# Patient Record
Sex: Female | Born: 1945 | Race: White | Hispanic: No | Smoking: Never smoker
Health system: Southern US, Community
[De-identification: ages and names within clinical notes are randomized; demographics above are authoritative.]

## PROBLEM LIST (undated history)

## (undated) DIAGNOSIS — F32A Depression, unspecified: Secondary | ICD-10-CM

## (undated) DIAGNOSIS — R569 Unspecified convulsions: Secondary | ICD-10-CM

## (undated) DIAGNOSIS — R413 Other amnesia: Secondary | ICD-10-CM

## (undated) HISTORY — DX: Other amnesia: R41.3

## (undated) HISTORY — PX: TUBAL LIGATION: SHX77

## (undated) HISTORY — DX: Unspecified convulsions: R56.9

## (undated) HISTORY — PX: APPENDECTOMY: SHX54

---

## 2000-10-16 ENCOUNTER — Other Ambulatory Visit: Admission: RE | Admit: 2000-10-16 | Discharge: 2000-10-16 | Payer: Self-pay | Admitting: Otolaryngology

## 2002-04-19 ENCOUNTER — Ambulatory Visit (HOSPITAL_COMMUNITY): Admission: RE | Admit: 2002-04-19 | Discharge: 2002-04-19 | Payer: Self-pay | Admitting: Neurology

## 2002-04-19 ENCOUNTER — Encounter: Payer: Self-pay | Admitting: Neurology

## 2004-06-23 ENCOUNTER — Ambulatory Visit: Payer: Self-pay | Admitting: Internal Medicine

## 2004-07-06 ENCOUNTER — Ambulatory Visit: Payer: Self-pay | Admitting: Internal Medicine

## 2004-07-06 ENCOUNTER — Ambulatory Visit (HOSPITAL_COMMUNITY): Admission: RE | Admit: 2004-07-06 | Discharge: 2004-07-06 | Payer: Self-pay | Admitting: Internal Medicine

## 2006-07-14 ENCOUNTER — Ambulatory Visit: Payer: Self-pay | Admitting: Cardiology

## 2012-06-12 DIAGNOSIS — M169 Osteoarthritis of hip, unspecified: Secondary | ICD-10-CM | POA: Insufficient documentation

## 2012-07-06 DIAGNOSIS — D649 Anemia, unspecified: Secondary | ICD-10-CM | POA: Insufficient documentation

## 2012-08-13 DIAGNOSIS — M1612 Unilateral primary osteoarthritis, left hip: Secondary | ICD-10-CM | POA: Insufficient documentation

## 2012-08-14 DIAGNOSIS — F32A Depression, unspecified: Secondary | ICD-10-CM | POA: Insufficient documentation

## 2012-08-16 ENCOUNTER — Inpatient Hospital Stay
Admission: RE | Admit: 2012-08-16 | Discharge: 2012-08-16 | Disposition: A | Discharge status: Home / Self Care | Payer: Medicare Other | Source: Ambulatory Visit | Attending: Internal Medicine | Admitting: Internal Medicine

## 2013-04-29 ENCOUNTER — Telehealth: Payer: Self-pay | Admitting: Neurology

## 2013-04-29 MED ORDER — LEVETIRACETAM 500 MG PO TABS: 1000.0000 mg | ORAL_TABLET | Freq: Two times a day (BID) | ORAL | Status: DC

## 2013-04-29 MED ORDER — LAMOTRIGINE 200 MG PO TABS: 200.0000 mg | ORAL_TABLET | Freq: Two times a day (BID) | ORAL | Status: DC

## 2013-04-29 NOTE — Telephone Encounter (Signed)
Per [facility], we have previously been prescribing these meds.  Patient has an appt in March.  Rx's have been sent to last until she is seen.

## 2013-04-29 NOTE — Telephone Encounter (Signed)
Patient states she needs new script written by Dr. Terrace ArabiaYan for Keppra and Lamictal, since she is about to run out, she has just enough for tomorrow morning. The script was written by a different doctor, Dr. Arty Baumgartnerhumley, and now it needs to be from Dr. Terrace ArabiaYan.

## 2013-05-16 ENCOUNTER — Telehealth: Payer: Self-pay | Admitting: Neurology

## 2013-05-16 NOTE — Telephone Encounter (Signed)
I received a call from Crouch[facility] at [facility] that the patient was there to have labs drawn for a Lamictal level, but the order was from a year ago, and the patient had already taken her medicine.  The patient is scheduled to come in on 05-22-13 to see the doctor, I told her that she can have her labs done here at that appointment.

## 2013-05-22 ENCOUNTER — Ambulatory Visit (INDEPENDENT_AMBULATORY_CARE_PROVIDER_SITE_OTHER): Payer: Medicare Other | Admitting: Neurology

## 2013-05-22 ENCOUNTER — Encounter (INDEPENDENT_AMBULATORY_CARE_PROVIDER_SITE_OTHER): Payer: Self-pay

## 2013-05-22 ENCOUNTER — Encounter: Payer: Self-pay | Admitting: Neurology

## 2013-05-22 VITALS — BP 128/68 | HR 80 | Ht 60.0 in | Wt 195.0 lb

## 2013-05-22 DIAGNOSIS — R269 Unspecified abnormalities of gait and mobility: Secondary | ICD-10-CM

## 2013-05-22 DIAGNOSIS — R569 Unspecified convulsions: Secondary | ICD-10-CM | POA: Insufficient documentation

## 2013-05-22 MED ORDER — LEVETIRACETAM 500 MG PO TABS: 1000.0000 mg | ORAL_TABLET | Freq: Two times a day (BID) | ORAL | Status: DC

## 2013-05-22 MED ORDER — LAMOTRIGINE 200 MG PO TABS
200.0000 mg | ORAL_TABLET | Freq: Two times a day (BID) | ORAL | Status: DC
Start: 2013-05-22 — End: 2013-06-10

## 2013-05-22 NOTE — Progress Notes (Signed)
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  She had a history of complex partial seizure with secondary generalization since age 80,  presented with rising sensation, then passed out followed by generalized motor seizure, last seizure was in 2002, she was treated with Lamictal 200 mg twice a day, continue to have recurrent seizure, Keppra 500 mg 2 tablets twice a day was added on, no recurrent seizure ever since. She is tolerating the medications, denies significant side effect.  Previously she was evaluated by outside neurologist, reported normal MRI of the brain, and the EEG,  She also had a history of left hip replacement, mild gait difficulty, more stiffness, denies bowel and bladder incontinence,  REVIEW OF SYSTEMS: Full 14 system review of systems performed and notable only for seizure, memory loss, frequent awakening, daytime sleepiness, looking difficulty, depression  ALLERGIES: No Known Allergies  HOME MEDICATIONS: No current outpatient prescriptions on file prior to visit.   No current facility-administered medications on file prior to visit.    PAST MEDICAL HISTORY: Past Medical History  Diagnosis Date  . Seizure     PAST SURGICAL HISTORY: Past Surgical History  Procedure Laterality Date  . Appendectomy    . Tubal ligation      FAMILY HISTORY: Family History  Problem Relation Age of Onset  . Pneumonia Mother   . Heart attack Maternal Grandmother   . Stroke Maternal Grandmother   . Suicidality Brother   . Aneurysm Maternal Aunt     SOCIAL HISTORY:  History   Social History  . Marital Status: Widowed    Spouse Name: N/A    Number of Children: 0  . Years of Education: college   Occupational History  .      retired   Social History Main Topics  .  Smoking status: Never Smoker   . Smokeless tobacco: Never Used  . Alcohol Use: 0.6 oz/week    1 Glasses of wine per week  . Drug Use: No  . Sexual Activity: Not on file   Other Topics Concern  . Not on file   Social History Narrative   Patient lives at home alone and she i]s widowed. Retired.   College education    Caffeine one cup daily.           PHYSICAL EXAM   Filed Vitals:   05/22/13 1130  BP: 128/68  Pulse: 80  Height: 5' (1.524 m)  Weight: 195 lb (88.451 kg)    Not recorded    Body mass index is 38.08 kg/(m^2).   Generalized: In no acute distress  Neck: Supple, no carotid bruits   Cardiac: Regular rate rhythm  Pulmonary: Clear to auscultation bilaterally  Musculoskeletal: No deformity  Neurological examination  Mentation: Alert oriented to time, place, history taking, and causual conversation  Cranial nerve II-XII: Pupils were equal round reactive to light. Extraocular movements were full.  Visual field were full on confrontational test. Bilateral fundi were sharp.  Facial sensation and strength were normal. Hearing was intact to finger rubbing bilaterally. Uvula tongue midline.  Head turning and shoulder shrug and were normal and symmetric.Tongue protrusion into cheek strength was normal.  Motor: Mild bilateral hip flexion weakness  Sensory: Intact to fine touch, pinprick, preserved vibratory sensation, and proprioception at toes.  Coordination: Normal finger  to nose, heel-to-shin bilaterally there was no truncal ataxia  Gait: Rising up from seated position without assistance, normal stance, stiff,scissor gait  Romberg signs: Negative  Deep tendon reflexes: Brachioradialis 3/3, biceps  3/3, triceps  3/3, patellar  3/3, Achilles 2/2, plantar responses were extensor bilaterally.   DIAGNOSTIC DATA (LABS, IMAGING, TESTING) - I reviewed patient records, labs, notes, testing and imaging myself where available.    ASSESSMENT AND PLAN  Sheri Valencia  is a 68 y.o. 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      Levert Feinstein, M.D. Ph.D.  [facility] [address] [place], [place] [address] [phone]

## 2013-06-06 ENCOUNTER — Ambulatory Visit (HOSPITAL_COMMUNITY)
Admission: RE | Admit: 2013-06-06 | Discharge: 2013-06-06 | Disposition: A | Discharge status: Home / Self Care | Payer: Medicare Other | Source: Ambulatory Visit | Attending: Neurology | Admitting: Neurology

## 2013-06-06 DIAGNOSIS — M47812 Spondylosis without myelopathy or radiculopathy, cervical region: Secondary | ICD-10-CM | POA: Insufficient documentation

## 2013-06-06 DIAGNOSIS — Q762 Congenital spondylolisthesis: Secondary | ICD-10-CM | POA: Insufficient documentation

## 2013-06-06 DIAGNOSIS — R569 Unspecified convulsions: Secondary | ICD-10-CM

## 2013-06-06 DIAGNOSIS — M5030 Other cervical disc degeneration, unspecified cervical region: Secondary | ICD-10-CM | POA: Insufficient documentation

## 2013-06-06 DIAGNOSIS — M5020 Other cervical disc displacement, unspecified cervical region: Secondary | ICD-10-CM | POA: Insufficient documentation

## 2013-06-06 DIAGNOSIS — R269 Unspecified abnormalities of gait and mobility: Secondary | ICD-10-CM

## 2013-06-06 DIAGNOSIS — M79609 Pain in unspecified limb: Secondary | ICD-10-CM | POA: Insufficient documentation

## 2013-06-06 DIAGNOSIS — M4802 Spinal stenosis, cervical region: Secondary | ICD-10-CM | POA: Insufficient documentation

## 2013-06-10 ENCOUNTER — Telehealth: Payer: Self-pay

## 2013-06-10 ENCOUNTER — Other Ambulatory Visit: Payer: Self-pay | Admitting: Neurology

## 2013-06-10 MED ORDER — LAMOTRIGINE 200 MG PO TABS: 200.0000 mg | ORAL_TABLET | Freq: Two times a day (BID) | ORAL | Status: DC

## 2013-06-10 NOTE — Telephone Encounter (Signed)
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 Options are conservative mgmt vs neurosurg eval. Will hold off on decision until Dr. Terrace ArabiaYan can review. -VRP ------

## 2013-06-10 NOTE — Telephone Encounter (Signed)
Spoke to patient. Went over MRI results. Patient agreed. Requesting lamoTRIgine (LAMICTAL) 200 MG tablet refill. [facility]

## 2013-06-10 NOTE — Telephone Encounter (Signed)
Refill sent.

## 2013-06-10 NOTE — Telephone Encounter (Signed)
Patient returning your call.

## 2013-06-20 NOTE — Progress Notes (Signed)
Quick Note:  Patient notified of MRI results. See phone note. ______

## 2013-06-25 ENCOUNTER — Ambulatory Visit (INDEPENDENT_AMBULATORY_CARE_PROVIDER_SITE_OTHER): Payer: Medicare Other | Admitting: Neurology

## 2013-06-25 ENCOUNTER — Encounter (INDEPENDENT_AMBULATORY_CARE_PROVIDER_SITE_OTHER): Payer: Self-pay

## 2013-06-25 ENCOUNTER — Encounter: Payer: Self-pay | Admitting: Neurology

## 2013-06-25 VITALS — BP 147/68 | HR 94 | Ht 65.0 in | Wt 101.0 lb

## 2013-06-25 DIAGNOSIS — M47812 Spondylosis without myelopathy or radiculopathy, cervical region: Secondary | ICD-10-CM | POA: Insufficient documentation

## 2013-06-25 NOTE — Progress Notes (Signed)
PATIENT: Melisse Flecker Janis DOB: September 16, 1945  HISTORICAL  Elpidia Graveline Mehrer is a 68 years old right-handed Caucasian female, has been followed by our clinic for complex partial seizure with secondary generalizing, last clinical visit was March 2014  She had a history of complex partial seizure with secondary generalization since age 37,  presented with rising sensation, then passed out followed by generalized motor seizure, last seizure was in 2002, she was treated with Lamictal 200 mg twice a day, continue to have recurrent seizure, Keppra 500 mg 2 tablets twice a day was added on, no recurrent seizure ever since. She is tolerating the medications, denies significant side effect.  Previously she was evaluated by outside neurologist, reported normal MRI of the brain, and the EEG,  She also had a history of left hip replacement in June 2014, mild gait difficulty, more stiffness, denies bowel and bladder incontinence,.  UPDATE April 21st 2015: She denies significant neck pain, she has right low back pain, going down her right buttock cheek, right lateral thigh,  She has mild unsteady gait, always contributed to her left hip problem, she denies bowel and bladder incontinence. She is tolerating her antiepileptic medication well, there was no recurrent seizure.   REVIEW OF SYSTEMS: Full 14 system review of systems performed and notable only for memory loss, seizure, right side low back pain, right hip pain   ALLERGIES: No Known Allergies  HOME MEDICATIONS: Current Outpatient Prescriptions on File Prior to Visit  Medication Sig Dispense Refill  . amLODipine (NORVASC) 5 MG tablet Take 5 mg by mouth daily.      Marland Kitchen aspirin 81 MG tablet Take 81 mg by mouth daily.      . Calcium Carbonate-Vit D-Min (CALTRATE PLUS PO) Take by mouth daily.      Marland Kitchen lamoTRIgine (LAMICTAL) 200 MG tablet Take 1 tablet (200 mg total) by mouth 2 (two) times daily.  60 tablet  12  . levETIRAcetam (KEPPRA) 500 MG tablet Take 2 tablets  (1,000 mg total) by mouth 2 (two) times daily.  120 tablet  12  . lisinopril (PRINIVIL,ZESTRIL) 20 MG tablet Take 20 mg by mouth daily.      . Multiple Vitamin (VITAMIN E/FOLIC ACID/B-6/B-12) CAPS Take by mouth daily.      . sertraline (ZOLOFT) 100 MG tablet Take 100 mg by mouth daily.      Marland Kitchen VITAMIN D, ERGOCALCIFEROL, PO Take by mouth daily.       No current facility-administered medications on file prior to visit.    PAST MEDICAL HISTORY: Past Medical History  Diagnosis Date  . Seizure     PAST SURGICAL HISTORY: Past Surgical History  Procedure Laterality Date  . Appendectomy    . Tubal ligation      FAMILY HISTORY: Family History  Problem Relation Age of Onset  . Pneumonia Mother   . Heart attack Maternal Grandmother   . Stroke Maternal Grandmother   . Suicidality Brother   . Aneurysm Maternal Aunt     SOCIAL HISTORY:  History   Social History  . Marital Status: Widowed    Spouse Name: N/A    Number of Children: 0  . Years of Education: college   Occupational History  .      retired   Social History Main Topics  . Smoking status: Never Smoker   . Smokeless tobacco: Never Used  . Alcohol Use: 0.6 oz/week    1 Glasses of wine per week  . Drug Use: No  .  Sexual Activity: Not on file   Other Topics Concern  . Not on file   Social History Narrative   Patient lives at home alone and she is widowed. Retired.   College education    Caffeine one cup daily.              PHYSICAL EXAM   Filed Vitals:   06/25/13 1042  BP: 147/68  Pulse: 94  Height: 5\' 5"  (1.651 m)  Weight: 101 lb (45.813 kg)    Not recorded    Body mass index is 16.81 kg/(m^2).   Generalized: In no acute distress  Neck: Supple, no carotid bruits   Cardiac: Regular rate rhythm  Pulmonary: Clear to auscultation bilaterally  Musculoskeletal: No deformity  Neurological examination  Mentation: Alert oriented to time, place, history taking, and causual  conversation  Cranial nerve II-XII: Pupils were equal round reactive to light. Extraocular movements were full.  Visual field were full on confrontational test. Bilateral fundi were sharp.  Facial sensation and strength were normal. Hearing was intact to finger rubbing bilaterally. Uvula tongue midline.  Head turning and shoulder shrug and were normal and symmetric.Tongue protrusion into cheek strength was normal.  Motor: Mild bilateral hip flexion weakness, Mild left shoulder abduction, external rotation, left elbow flexion weakness  Sensory: Intact to fine touch, pinprick, preserved vibratory sensation, and proprioception at toes.  Coordination: Normal finger to nose, heel-to-shin bilaterally there was no truncal ataxia  Gait: Rising up from seated position without assistance, normal stance, stiff,scissor gait  Romberg signs: Negative  Deep tendon reflexes: Brachioradialis 3/3, biceps  3/3, triceps  3/3, patellar  3/3, Achilles 2/2, plantar responses were extensor bilaterally.   DIAGNOSTIC DATA (LABS, IMAGING, TESTING) - I reviewed patient records, labs, notes, testing and imaging myself where available.    ASSESSMENT AND PLAN  Flannery Smithart Frost is a 68 y.o. 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   1. Continue Lamictal, Keppra, 2, she has signs of cervical myelopathy, which is confirmed by abnormal cervical MRI, detailed above, will refer her to neurosurgeon  Levert Feinstein, M.D. Ph.D.  [facility] [address] [place], [place]  [address] [phone]

## 2013-08-15 ENCOUNTER — Telehealth: Payer: Self-pay | Admitting: Neurology

## 2013-08-15 NOTE — Telephone Encounter (Signed)
Patient calling asking for referral to neurosurgeon be to Dr Trey Sailors in [place]. Please call to let her know this is ok

## 2013-08-19 NOTE — Telephone Encounter (Signed)
Pt calling requesting that referral be sent over to Dr. Channing Muttersoy office. Darreld Mcleanana Cox, MA please advise

## 2013-08-20 NOTE — Telephone Encounter (Signed)
Faxed. To Dr.Roy's office.

## 2014-01-29 ENCOUNTER — Encounter: Payer: Self-pay | Admitting: Neurology

## 2014-05-20 ENCOUNTER — Other Ambulatory Visit: Payer: Self-pay | Admitting: Neurology

## 2014-05-20 NOTE — Telephone Encounter (Signed)
Patient has appt scheduled

## 2014-05-21 ENCOUNTER — Other Ambulatory Visit: Payer: Self-pay | Admitting: Neurology

## 2014-05-21 ENCOUNTER — Telehealth: Payer: Self-pay | Admitting: Neurology

## 2014-05-21 NOTE — Telephone Encounter (Signed)
Both Rx's were already sent yesterday.  I called and spoke with the pharmacist who verified they do have the Rx's.  I called the patient back.  Got no answer.  Left message.

## 2014-05-21 NOTE — Telephone Encounter (Signed)
Pt is calling requesting a refill on lamoTRIgine (LAMICTAL) 200 MG tablet and levETIRAcetam (KEPPRA) 500 MG tablet. She uses [facility] in [place].  Please call and advise.

## 2014-05-21 NOTE — Telephone Encounter (Signed)
Duplicate request.  Rx was already sent.

## 2014-06-26 ENCOUNTER — Ambulatory Visit: Payer: Medicare Other | Admitting: Nurse Practitioner

## 2014-06-26 ENCOUNTER — Ambulatory Visit (INDEPENDENT_AMBULATORY_CARE_PROVIDER_SITE_OTHER): Payer: Medicare Other | Admitting: Nurse Practitioner

## 2014-06-26 ENCOUNTER — Encounter: Payer: Self-pay | Admitting: Nurse Practitioner

## 2014-06-26 VITALS — BP 152/69 | HR 83 | Ht 61.0 in | Wt 106.0 lb

## 2014-06-26 DIAGNOSIS — R569 Unspecified convulsions: Secondary | ICD-10-CM

## 2014-06-26 DIAGNOSIS — M47812 Spondylosis without myelopathy or radiculopathy, cervical region: Secondary | ICD-10-CM

## 2014-06-26 DIAGNOSIS — R269 Unspecified abnormalities of gait and mobility: Secondary | ICD-10-CM | POA: Diagnosis not present

## 2014-06-26 MED ORDER — LAMOTRIGINE 200 MG PO TABS: 200.0000 mg | ORAL_TABLET | Freq: Two times a day (BID) | ORAL | Status: DC

## 2014-06-26 MED ORDER — LEVETIRACETAM 500 MG PO TABS: ORAL_TABLET | ORAL | Status: DC

## 2014-06-26 NOTE — Patient Instructions (Signed)
Continue Lamictal and Keppra at current doses will refill Call for any seizure activity Follow-up yearly and when necessary

## 2014-06-26 NOTE — Progress Notes (Signed)
[facility]  PATIENT: Sheri Valencia DOB: Oct 24, 1945   REASON FOR VISIT: Follow-up for seizure disorder  HISTORY FROM: Patient and sister    HISTORY OF PRESENT ILLNESS:Sheri Valencia is a 69 years old right-handed Caucasian female,followed  for complex partial seizure with secondary generalizing, last clinical visit was 06/25/2013 She had a history of complex partial seizure with secondary generalization since age 47, presented with rising sensation, then passed out followed by generalized motor seizure, last seizure was in 2002, she was treated with Lamictal 200 mg twice a day, continue to have recurrent seizure, Keppra 500 mg 2 tablets twice a day was added on, no recurrent seizure ever since. 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 She denies significant neck pain, she has right low back pain, going down her right buttock cheek, right lateral thigh, She has mild unsteady gait, always contributed to her left hip problem, she denies bowel and bladder incontinence. She is tolerating her antiepileptic medication well, there was no recurrent seizure. She was evaluated last year by Dr. Channing Mutters, neurosurgeon she did not have surgery . She returns for reevaluation and refills   REVIEW OF SYSTEMS: Full 14 system review of systems performed and notable only for those listed, all others are neg:  Constitutional: neg  Cardiovascular: neg Ear/Nose/Throat: neg  Skin: neg Eyes: neg Respiratory: neg Gastroitestinal: neg  Hematology/Lymphatic: neg  Endocrine: neg Musculoskeletal:neg Allergy/Immunology: neg Neurological: neg Psychiatric: Decreased concentration Sleep : neg   ALLERGIES: Allergies  Allergen Reactions  .  Ativan [Lorazepam]     confusion    HOME MEDICATIONS: Outpatient Prescriptions Prior to Visit  Medication Sig Dispense Refill  . aspirin 81 MG tablet Take 81 mg by mouth daily.    . Calcium Carbonate-Vit D-Min (CALTRATE PLUS PO) Take by mouth daily.    Marland Kitchen lamoTRIgine (LAMICTAL) 200 MG tablet Take 1 tablet (200 mg total) by mouth 2 (two) times daily. 60 tablet 12  . levETIRAcetam (KEPPRA) 500 MG tablet TAKE 2 TABLETS BY MOUTH TWICE DAILY. 120 tablet 1  . Multiple Vitamin (VITAMIN E/FOLIC ACID/B-6/B-12) CAPS Take by mouth daily.    . sertraline (ZOLOFT) 100 MG tablet Take 100 mg by mouth daily.    Marland Kitchen VITAMIN D, ERGOCALCIFEROL, PO Take by mouth daily.    Marland Kitchen amLODipine (NORVASC) 5 MG tablet Take 5 mg by mouth daily.    Marland Kitchen lisinopril (PRINIVIL,ZESTRIL) 20 MG tablet Take 20 mg by mouth daily.    Marland Kitchen lamoTRIgine (LAMICTAL) 200 MG tablet TAKE (1) TABLET TWICE DAILY. 60 tablet 1   No facility-administered medications prior to visit.    PAST MEDICAL HISTORY: Past Medical History  Diagnosis Date  . Seizure     PAST SURGICAL HISTORY: Past Surgical History  Procedure Laterality Date  . Appendectomy    . Tubal ligation      FAMILY HISTORY: Family History  Problem Relation Age of Onset  . Pneumonia Mother   . Heart attack Maternal Grandmother   . Stroke Maternal Grandmother   . Suicidality Brother   . Aneurysm Maternal Aunt     SOCIAL HISTORY: History   Social History  . Marital Status: Widowed    Spouse Name: N/A  . Number of Children: 0  . Years of Education: college  Occupational History  .      retired   Social History Main Topics  . Smoking status: Never Smoker   . Smokeless tobacco: Never Used  . Alcohol Use: 0.6 oz/week    1 Glasses of wine per week  . Drug Use: No  . Sexual Activity: Not on file   Other Topics Concern  . Not on file   Social History Narrative   Patient lives at home alone and she is widowed. Retired.   College education    Caffeine one cup  daily.              PHYSICAL EXAM  Filed Vitals:   06/26/14 1355  BP: 152/69  Pulse: 83  Height: 5\' 1"  (1.549 m)  Weight: 106 lb (48.081 kg)   Body mass index is 20.04 kg/(m^2). Generalized: In no acute distress Neck: Supple, no carotid bruits  Cardiac: Regular rate rhythm Pulmonary: Clear to auscultation bilaterally Musculoskeletal: No deformity  Neurological examination Mentation: Alert oriented to time, place, history taking, and causual conversation Cranial nerve II-XII: Pupils were equal round reactive to light. Extraocular movements were full. Visual field were full on confrontational test. Bilateral fundi were sharp. Facial sensation and strength were normal. Hearing was intact to finger rubbing bilaterally. Uvula tongue midline. Head turning and shoulder shrug and were normal and symmetric.Tongue protrusion into cheek strength was normal. Motor: Mild bilateral hip flexion weakness, otherwise good strength in the upper and lower extremities. Sensory: Intact to fine touch, pinprick, preserved vibratory sensation, and proprioception at toes. Coordination: Normal finger to nose, heel-to-shin bilaterally Gait: Rising up from seated position without assistance, normal stance, can heel toe and tandem walk without difficulty Romberg signs: Negative  Deep tendon reflexes: Brachioradialis 2/2, biceps 2/2, triceps 2/2, patellar 2/2, Achilles 2/2, plantar responses were extensor bilaterally.   DIAGNOSTIC DATA (LABS, IMAGING, TESTING)   ASSESSMENT AND PLAN  69 y.o. year old female  has a past medical history of complex partial Seizure with secondary generalization. Last seizure activity in 2002.  Continue Lamictal and Keppra at current doses will refill Call for any seizure activity Follow-up yearly and when necessary Nilda Riggs, [facility], [facility], APRN  [facility] [address] [place], [place] [address] [phone]

## 2014-06-27 NOTE — Progress Notes (Signed)
I have reviewed and agreed above plan. 

## 2014-07-03 ENCOUNTER — Telehealth: Payer: Self-pay | Admitting: Internal Medicine

## 2014-07-03 NOTE — Telephone Encounter (Signed)
TRIAGE FOR [employer]. 

## 2014-07-04 NOTE — Telephone Encounter (Signed)
Correction: Did not mail the first letter, she was not referred from PCP, on recall.  Mailed the recall letter.

## 2014-07-04 NOTE — Telephone Encounter (Signed)
Letter mailed to pt.  

## 2015-03-10 DIAGNOSIS — I10 Essential (primary) hypertension: Secondary | ICD-10-CM | POA: Diagnosis not present

## 2015-03-10 DIAGNOSIS — G309 Alzheimer's disease, unspecified: Secondary | ICD-10-CM | POA: Diagnosis not present

## 2015-03-10 DIAGNOSIS — F0280 Dementia in other diseases classified elsewhere without behavioral disturbance: Secondary | ICD-10-CM | POA: Diagnosis not present

## 2015-03-10 DIAGNOSIS — E7800 Pure hypercholesterolemia, unspecified: Secondary | ICD-10-CM | POA: Diagnosis not present

## 2015-03-11 DIAGNOSIS — M4712 Other spondylosis with myelopathy, cervical region: Secondary | ICD-10-CM | POA: Diagnosis not present

## 2015-03-11 DIAGNOSIS — M4312 Spondylolisthesis, cervical region: Secondary | ICD-10-CM | POA: Diagnosis not present

## 2015-06-09 DIAGNOSIS — E7800 Pure hypercholesterolemia, unspecified: Secondary | ICD-10-CM | POA: Diagnosis not present

## 2015-06-10 DIAGNOSIS — F329 Major depressive disorder, single episode, unspecified: Secondary | ICD-10-CM | POA: Diagnosis not present

## 2015-06-10 DIAGNOSIS — E7800 Pure hypercholesterolemia, unspecified: Secondary | ICD-10-CM | POA: Diagnosis not present

## 2015-06-10 DIAGNOSIS — G309 Alzheimer's disease, unspecified: Secondary | ICD-10-CM | POA: Diagnosis not present

## 2015-06-10 DIAGNOSIS — F0280 Dementia in other diseases classified elsewhere without behavioral disturbance: Secondary | ICD-10-CM | POA: Diagnosis not present

## 2015-06-10 DIAGNOSIS — Z6821 Body mass index (BMI) 21.0-21.9, adult: Secondary | ICD-10-CM | POA: Diagnosis not present

## 2015-06-10 DIAGNOSIS — Z789 Other specified health status: Secondary | ICD-10-CM | POA: Diagnosis not present

## 2015-06-22 ENCOUNTER — Other Ambulatory Visit: Payer: Self-pay | Admitting: Nurse Practitioner

## 2015-06-26 ENCOUNTER — Ambulatory Visit (INDEPENDENT_AMBULATORY_CARE_PROVIDER_SITE_OTHER): Payer: PPO | Admitting: Nurse Practitioner

## 2015-06-26 ENCOUNTER — Encounter (INDEPENDENT_AMBULATORY_CARE_PROVIDER_SITE_OTHER): Payer: Self-pay

## 2015-06-26 ENCOUNTER — Encounter: Payer: Self-pay | Admitting: Nurse Practitioner

## 2015-06-26 VITALS — BP 135/64 | HR 77 | Ht 61.0 in | Wt 103.8 lb

## 2015-06-26 DIAGNOSIS — R569 Unspecified convulsions: Secondary | ICD-10-CM | POA: Diagnosis not present

## 2015-06-26 DIAGNOSIS — M47812 Spondylosis without myelopathy or radiculopathy, cervical region: Secondary | ICD-10-CM | POA: Diagnosis not present

## 2015-06-26 DIAGNOSIS — R269 Unspecified abnormalities of gait and mobility: Secondary | ICD-10-CM | POA: Diagnosis not present

## 2015-06-26 MED ORDER — LEVETIRACETAM 500 MG PO TABS: ORAL_TABLET | ORAL | Status: DC

## 2015-06-26 MED ORDER — LAMOTRIGINE 200 MG PO TABS: 200.0000 mg | ORAL_TABLET | Freq: Two times a day (BID) | ORAL | Status: DC

## 2015-06-26 NOTE — Patient Instructions (Signed)
Continue Lamictal and Keppra at current doses will refill Call for any seizure activity Follow-up yearly and when necessary

## 2015-06-26 NOTE — Progress Notes (Signed)
[facility]  PATIENT: Sheri Valencia DOB: 09/17/1945   REASON FOR VISIT: Follow-up for seizure disorder,  cervical spondylosis without myelopathy  HISTORY FROM: Patient    HISTORY OF PRESENT ILLNESS:Sheri Valencia is a 70 years old right-handed Caucasian female,followed for complex partial seizure with secondary generalizing, last clinical visit was 06/26/2014. She had a history of complex partial seizure with secondary generalization since age 68, presented with rising sensation, then passed out followed by generalized motor seizure, last seizure was in 2002, she was treated with Lamictal 200 mg twice a day, continue to have recurrent seizure, Keppra 500 mg 2 tablets twice a day was added on, no recurrent seizure ever since. 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 She has mild unsteady gait, always contributed to her left hip problem,  She is tolerating her antiepileptic medication well, there was no recurrent seizure. She was evaluated last year by Dr. Channing Mutters, neurosurgeon she did not have surgery . She returns for reevaluation and refills. Recently placed on Aricept by PCP Dr. [name].     REVIEW OF SYSTEMS: Full 14 system review of systems performed and notable only for those listed, all others are neg:  Constitutional: Fatigue Cardiovascular: neg Ear/Nose/Throat: neg  Skin: neg Eyes: neg Respiratory: neg Gastroitestinal: neg  Hematology/Lymphatic: neg  Endocrine: neg Musculoskeletal: Joint pain Allergy/Immunology: neg Neurological: History of seizure disorder Psychiatric: neg Sleep : neg   ALLERGIES: Allergies  Allergen Reactions  . Ativan [Lorazepam]     confusion    HOME MEDICATIONS: Outpatient  Prescriptions Prior to Visit  Medication Sig Dispense Refill  . amLODipine (NORVASC) 5 MG tablet Take 5 mg by mouth daily.    Marland Kitchen aspirin 81 MG tablet Take 81 mg by mouth daily.    . Calcium Carbonate-Vit D-Min (CALTRATE PLUS PO) Take by mouth daily.    Marland Kitchen lamoTRIgine (LAMICTAL) 200 MG tablet Take 1 tablet (200 mg total) by mouth 2 (two) times daily. 60 tablet 12  . levETIRAcetam (KEPPRA) 500 MG tablet TAKE 2 TABLETS BY MOUTH TWICE DAILY. 120 tablet 12  . lisinopril (PRINIVIL,ZESTRIL) 20 MG tablet Take 20 mg by mouth daily.    . Multiple Vitamin (VITAMIN E/FOLIC ACID/B-6/B-12) CAPS Take by mouth daily.    . sertraline (ZOLOFT) 100 MG tablet Take 100 mg by mouth daily.    Marland Kitchen VITAMIN D, ERGOCALCIFEROL, PO Take by mouth daily.     No facility-administered medications prior to visit.    PAST MEDICAL HISTORY: Past Medical History  Diagnosis Date  . Seizure (HCC)     PAST SURGICAL HISTORY: Past Surgical History  Procedure Laterality Date  . Appendectomy    . Tubal ligation      FAMILY HISTORY: Family History  Problem Relation Age of Onset  . Pneumonia Mother   . Heart attack Maternal Grandmother   . Stroke Maternal Grandmother   . Suicidality Brother   . Aneurysm Maternal Aunt   . Breast cancer Sister     04/2015  Stage I    SOCIAL HISTORY: Social History   Social History  . Marital Status: Widowed    Spouse Name: N/A  . Number of Children: 0  . Years of Education: college   Occupational History  .  retired   Social History Main Topics  . Smoking status: Never Smoker   . Smokeless tobacco: Never Used  . Alcohol Use: 0.6 oz/week    1 Glasses of wine per week  . Drug Use: No  . Sexual Activity: Not on file   Other Topics Concern  . Not on file   Social History Narrative   Patient lives at home alone and she is widowed. Retired.   College education    Caffeine one cup daily.              PHYSICAL EXAM  Filed Vitals:   06/26/15 1104  BP: 135/64    Pulse: 77  Height: 5\' 1"  (1.549 m)  Weight: 103 lb 12.8 oz (47.083 kg)   Body mass index is 19.62 kg/(m^2). Generalized: In no acute distress Neck: Supple, no carotid bruits  Cardiac: Regular rate rhythm Pulmonary: Clear to auscultation bilaterally Musculoskeletal: No deformity  Neurological examination Mentation: Alert oriented to time, place, history taking, and causual conversation Cranial nerve II-XII: Pupils were equal round reactive to light. Extraocular movements were full. Visual field were full on confrontational test. Bilateral fundi were sharp. Facial sensation and strength were normal. Hearing was intact to finger rubbing bilaterally. Uvula tongue midline. Head turning and shoulder shrug and were normal and symmetric.Tongue protrusion into cheek strength was normal. Motor: Mild bilateral hip flexion weakness, otherwise good strength in the upper and lower extremities. Sensory: Intact to fine touch, pinprick, preserved vibratory sensation,  Coordination: Normal finger to nose, heel-to-shin bilaterally Gait: Rising up from seated position without assistance, normal stance, can heel toe and tandem walk without difficulty Romberg signs: Negative Deep tendon reflexes: Brachioradialis 2/2, biceps 2/2, triceps 2/2, patellar 2/2, Achilles 2/2, plantar responses were extensor bilaterally.  DIAGNOSTIC DATA (LABS, IMAGING, TESTING) - ASSESSMENT AND PLAN 70 y.o. year old female has a past medical history of complex partial Seizure with secondary generalization. Last seizure activity in 2002.The patient is a current patient of Dr. Terrace Arabia who is out of the office today . This note is sent to the work in doctor.     Continue Lamictal and Keppra at current doses will refill Call for any seizure activity Follow-up yearly and when necessary Nilda Riggs, [facility], [facility], APRN  [facility] [address] [place], [place] [address] [phone]

## 2015-06-28 NOTE — Progress Notes (Signed)
I reviewed note and agree with plan.   Suanne MarkerVIKRAM R. PENUMALLI, MD 06/28/2015, 1:58 PM Certified in Neurology, Neurophysiology and Neuroimaging  [facility] [address], [address] [place], [place] [id number] [phone]

## 2015-09-16 DIAGNOSIS — Z299 Encounter for prophylactic measures, unspecified: Secondary | ICD-10-CM | POA: Diagnosis not present

## 2015-09-16 DIAGNOSIS — G309 Alzheimer's disease, unspecified: Secondary | ICD-10-CM | POA: Diagnosis not present

## 2015-09-16 DIAGNOSIS — I10 Essential (primary) hypertension: Secondary | ICD-10-CM | POA: Diagnosis not present

## 2015-09-16 DIAGNOSIS — F329 Major depressive disorder, single episode, unspecified: Secondary | ICD-10-CM | POA: Diagnosis not present

## 2015-09-16 DIAGNOSIS — F0280 Dementia in other diseases classified elsewhere without behavioral disturbance: Secondary | ICD-10-CM | POA: Diagnosis not present

## 2015-09-17 DIAGNOSIS — M4712 Other spondylosis with myelopathy, cervical region: Secondary | ICD-10-CM | POA: Diagnosis not present

## 2015-09-17 DIAGNOSIS — M4312 Spondylolisthesis, cervical region: Secondary | ICD-10-CM | POA: Diagnosis not present

## 2015-10-19 ENCOUNTER — Telehealth: Payer: Self-pay | Admitting: Neurology

## 2015-10-19 DIAGNOSIS — F0390 Unspecified dementia without behavioral disturbance: Secondary | ICD-10-CM | POA: Diagnosis not present

## 2015-10-19 DIAGNOSIS — F329 Major depressive disorder, single episode, unspecified: Secondary | ICD-10-CM | POA: Diagnosis not present

## 2015-10-19 DIAGNOSIS — I10 Essential (primary) hypertension: Secondary | ICD-10-CM | POA: Diagnosis not present

## 2015-10-19 DIAGNOSIS — E7800 Pure hypercholesterolemia, unspecified: Secondary | ICD-10-CM | POA: Diagnosis not present

## 2015-10-19 NOTE — Telephone Encounter (Signed)
90 day rx sent in 06/26/15 w/ 3 refills - pt aware to request from pharmacy.

## 2015-10-19 NOTE — Telephone Encounter (Signed)
Patient requesting refill of  levETIRAcetam (KEPPRA) 500 MG tablet Pharmacy:[facility] Pt will run out of medication today

## 2015-10-28 DIAGNOSIS — F0280 Dementia in other diseases classified elsewhere without behavioral disturbance: Secondary | ICD-10-CM | POA: Diagnosis not present

## 2015-10-28 DIAGNOSIS — Z299 Encounter for prophylactic measures, unspecified: Secondary | ICD-10-CM | POA: Diagnosis not present

## 2015-10-28 DIAGNOSIS — G309 Alzheimer's disease, unspecified: Secondary | ICD-10-CM | POA: Diagnosis not present

## 2015-10-28 DIAGNOSIS — R569 Unspecified convulsions: Secondary | ICD-10-CM | POA: Diagnosis not present

## 2015-10-29 DIAGNOSIS — M79604 Pain in right leg: Secondary | ICD-10-CM | POA: Diagnosis not present

## 2015-10-29 DIAGNOSIS — M5136 Other intervertebral disc degeneration, lumbar region: Secondary | ICD-10-CM | POA: Diagnosis not present

## 2015-10-29 DIAGNOSIS — Q7649 Other congenital malformations of spine, not associated with scoliosis: Secondary | ICD-10-CM | POA: Diagnosis not present

## 2016-01-06 DIAGNOSIS — Z713 Dietary counseling and surveillance: Secondary | ICD-10-CM | POA: Diagnosis not present

## 2016-01-06 DIAGNOSIS — I10 Essential (primary) hypertension: Secondary | ICD-10-CM | POA: Diagnosis not present

## 2016-01-06 DIAGNOSIS — M5440 Lumbago with sciatica, unspecified side: Secondary | ICD-10-CM | POA: Diagnosis not present

## 2016-01-06 DIAGNOSIS — Z6820 Body mass index (BMI) 20.0-20.9, adult: Secondary | ICD-10-CM | POA: Diagnosis not present

## 2016-01-06 DIAGNOSIS — Z299 Encounter for prophylactic measures, unspecified: Secondary | ICD-10-CM | POA: Diagnosis not present

## 2016-01-18 DIAGNOSIS — F329 Major depressive disorder, single episode, unspecified: Secondary | ICD-10-CM | POA: Diagnosis not present

## 2016-01-18 DIAGNOSIS — F0390 Unspecified dementia without behavioral disturbance: Secondary | ICD-10-CM | POA: Diagnosis not present

## 2016-01-18 DIAGNOSIS — I10 Essential (primary) hypertension: Secondary | ICD-10-CM | POA: Diagnosis not present

## 2016-01-18 DIAGNOSIS — E7800 Pure hypercholesterolemia, unspecified: Secondary | ICD-10-CM | POA: Diagnosis not present

## 2016-02-12 DIAGNOSIS — I10 Essential (primary) hypertension: Secondary | ICD-10-CM | POA: Diagnosis not present

## 2016-02-12 DIAGNOSIS — F329 Major depressive disorder, single episode, unspecified: Secondary | ICD-10-CM | POA: Diagnosis not present

## 2016-02-12 DIAGNOSIS — E7800 Pure hypercholesterolemia, unspecified: Secondary | ICD-10-CM | POA: Diagnosis not present

## 2016-02-12 DIAGNOSIS — F0390 Unspecified dementia without behavioral disturbance: Secondary | ICD-10-CM | POA: Diagnosis not present

## 2016-02-16 DIAGNOSIS — E559 Vitamin D deficiency, unspecified: Secondary | ICD-10-CM | POA: Diagnosis not present

## 2016-02-16 DIAGNOSIS — Z0000 Encounter for general adult medical examination without abnormal findings: Secondary | ICD-10-CM | POA: Diagnosis not present

## 2016-02-16 DIAGNOSIS — Z7189 Other specified counseling: Secondary | ICD-10-CM | POA: Diagnosis not present

## 2016-02-16 DIAGNOSIS — Z299 Encounter for prophylactic measures, unspecified: Secondary | ICD-10-CM | POA: Diagnosis not present

## 2016-02-16 DIAGNOSIS — Z6820 Body mass index (BMI) 20.0-20.9, adult: Secondary | ICD-10-CM | POA: Diagnosis not present

## 2016-02-16 DIAGNOSIS — Z79899 Other long term (current) drug therapy: Secondary | ICD-10-CM | POA: Diagnosis not present

## 2016-02-16 DIAGNOSIS — R5383 Other fatigue: Secondary | ICD-10-CM | POA: Diagnosis not present

## 2016-02-16 DIAGNOSIS — E7800 Pure hypercholesterolemia, unspecified: Secondary | ICD-10-CM | POA: Diagnosis not present

## 2016-02-16 DIAGNOSIS — Z1211 Encounter for screening for malignant neoplasm of colon: Secondary | ICD-10-CM | POA: Diagnosis not present

## 2016-02-16 DIAGNOSIS — Z1389 Encounter for screening for other disorder: Secondary | ICD-10-CM | POA: Diagnosis not present

## 2016-02-16 DIAGNOSIS — F329 Major depressive disorder, single episode, unspecified: Secondary | ICD-10-CM | POA: Diagnosis not present

## 2016-02-18 ENCOUNTER — Encounter (INDEPENDENT_AMBULATORY_CARE_PROVIDER_SITE_OTHER): Payer: Self-pay | Admitting: *Deleted

## 2016-02-24 DIAGNOSIS — E2839 Other primary ovarian failure: Secondary | ICD-10-CM | POA: Diagnosis not present

## 2016-03-22 DIAGNOSIS — M79604 Pain in right leg: Secondary | ICD-10-CM | POA: Diagnosis not present

## 2016-03-22 DIAGNOSIS — M4712 Other spondylosis with myelopathy, cervical region: Secondary | ICD-10-CM | POA: Diagnosis not present

## 2016-06-24 DIAGNOSIS — F329 Major depressive disorder, single episode, unspecified: Secondary | ICD-10-CM | POA: Diagnosis not present

## 2016-06-24 DIAGNOSIS — F0390 Unspecified dementia without behavioral disturbance: Secondary | ICD-10-CM | POA: Diagnosis not present

## 2016-06-24 DIAGNOSIS — I10 Essential (primary) hypertension: Secondary | ICD-10-CM | POA: Diagnosis not present

## 2016-06-24 DIAGNOSIS — E7800 Pure hypercholesterolemia, unspecified: Secondary | ICD-10-CM | POA: Diagnosis not present

## 2016-06-29 ENCOUNTER — Ambulatory Visit (INDEPENDENT_AMBULATORY_CARE_PROVIDER_SITE_OTHER): Payer: PPO | Admitting: Nurse Practitioner

## 2016-06-29 ENCOUNTER — Encounter (INDEPENDENT_AMBULATORY_CARE_PROVIDER_SITE_OTHER): Payer: Self-pay

## 2016-06-29 ENCOUNTER — Encounter: Payer: Self-pay | Admitting: Nurse Practitioner

## 2016-06-29 VITALS — BP 165/68 | HR 70 | Wt 105.4 lb

## 2016-06-29 DIAGNOSIS — R569 Unspecified convulsions: Secondary | ICD-10-CM

## 2016-06-29 DIAGNOSIS — M47812 Spondylosis without myelopathy or radiculopathy, cervical region: Secondary | ICD-10-CM

## 2016-06-29 MED ORDER — LEVETIRACETAM 500 MG PO TABS: ORAL_TABLET | ORAL | 3 refills | Status: DC

## 2016-06-29 MED ORDER — LAMOTRIGINE 200 MG PO TABS: 200.0000 mg | ORAL_TABLET | Freq: Two times a day (BID) | ORAL | 3 refills | Status: DC

## 2016-06-29 NOTE — Patient Instructions (Signed)
Continue Lamictal and Keppra at current doses will refill Call for any seizure activity Follow-up yearly and when necessary

## 2016-06-29 NOTE — Progress Notes (Signed)
I have reviewed and agreed above plan. 

## 2016-06-29 NOTE — Progress Notes (Signed)
[facility]  PATIENT: Sheri Valencia DOB: 02-26-1946   REASON FOR VISIT: Follow-up for seizure disorder,  cervical spondylosis HISTORY FROM: Patient    HISTORY OF PRESENT ILLNESS:Sheri Valencia is a 71 years old right-handed Caucasian female,followed for complex partial seizure with secondary generalization. She has a history of complex partial seizure with secondary generalization since age 59, presented with rising sensation, then passed out followed by generalized motor seizure, last seizure was in 2002, she was treated with Lamictal 200 mg twice a day, continue to have recurrent seizure, Keppra 500 mg 2 tablets twice a day was added on, no recurrent seizure since. 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 Other levels alsodemonstrate significant stenosis.. She denies significant neck pain She has mild unsteady gait, always contributed to her left hip problem,  She was evaluated recently  by Dr. Channing Mutters, neurosurgeon, she follows up yearly   She returns for reevaluation and refills. She is on Aricept by PCP Dr. [name].     REVIEW OF SYSTEMS: Full 14 system review of systems performed and notable only for those listed, all others are neg:  Constitutional: neg Cardiovascular: neg Ear/Nose/Throat: neg  Skin: neg Eyes: neg Respiratory: neg Gastroitestinal: neg  Hematology/Lymphatic: neg  Endocrine: neg Musculoskeletal: Joint pain Allergy/Immunology: neg Neurological: History of seizure  disorder, memory loss Psychiatric: Decreased concentration Sleep : neg   ALLERGIES: Allergies  Allergen Reactions  . Ativan [Lorazepam]     confusion    HOME MEDICATIONS: Outpatient Medications Prior to Visit  Medication Sig Dispense Refill  . amLODipine (NORVASC) 5 MG tablet Take 5 mg by mouth daily.    Marland Kitchen aspirin 81 MG tablet Take 81 mg by mouth daily.    . Calcium Carbonate-Vit D-Min (CALTRATE PLUS PO) Take by mouth daily.    Marland Kitchen donepezil (ARICEPT) 5 MG tablet     . lamoTRIgine (LAMICTAL) 200 MG tablet Take 1 tablet (200 mg total) by mouth 2 (two) times daily. 180 tablet 3  . levETIRAcetam (KEPPRA) 500 MG tablet TAKE 2 TABLETS BY MOUTH TWICE DAILY. 360 tablet 3  . lisinopril (PRINIVIL,ZESTRIL) 20 MG tablet Take 20 mg by mouth daily.    . Multiple Vitamin (VITAMIN E/FOLIC ACID/B-6/B-12) CAPS Take by mouth daily.    . pravastatin (PRAVACHOL) 20 MG tablet     . sertraline (ZOLOFT) 100 MG tablet Take 100 mg by mouth daily.    Marland Kitchen VITAMIN D, ERGOCALCIFEROL, PO Take by mouth daily.     No facility-administered medications prior to visit.     PAST MEDICAL HISTORY: Past Medical History:  Diagnosis Date  . Seizure (HCC)     PAST SURGICAL HISTORY: Past Surgical History:  Procedure Laterality Date  . APPENDECTOMY    . TUBAL LIGATION      FAMILY HISTORY: Family History  Problem Relation Age of Onset  . Pneumonia Mother   . Heart attack Maternal Grandmother   . Stroke Maternal Grandmother   . Suicidality Brother   . Aneurysm Maternal Aunt   . Breast cancer Sister     04/2015  Stage I  SOCIAL HISTORY: Social History   Social History  . Marital status: Widowed    Spouse name: N/A  . Number of children: 0  . Years of education: college   Occupational History  .  Retired    retired   Social History Main Topics  . Smoking status: Never Smoker  . Smokeless tobacco: Never Used  . Alcohol use 0.6 oz/week    1 Glasses of wine per week  . Drug use: No  . Sexual  activity: Not on file   Other Topics Concern  . Not on file   Social History Narrative   Patient lives at home alone and she is widowed. Retired.   College education    Caffeine one cup daily.              PHYSICAL EXAM  Vitals:   06/29/16 1119  BP: (!) 165/68  Pulse: 70  Weight: 105 lb 6.4 oz (47.8 kg)   Body mass index is 19.92 kg/m. Generalized: In no acute distress Neck: Supple, no carotid bruits  Musculoskeletal: No deformity  Neurological examination Mentation: Alert oriented to time, place, history taking, and causual conversation Cranial nerve II-XII: Pupils were equal round reactive to light. Extraocular movements were full. Visual field were full on confrontational test. Bilateral fundi were sharp. Facial sensation and strength were normal. Hearing was intact to finger rubbing bilaterally. Uvula tongue midline. Head turning and shoulder shrug and were normal and symmetric.Tongue protrusion into cheek strength was normal. Motor: Mild bilateral hip flexion weakness, otherwise good strength in the upper and lower extremities. Sensory: Intact to fine touch, pinprick, preserved vibratory sensation In the upper and lower extremities,  Coordination: Normal finger to nose, heel-to-shin bilaterally Gait: Rising up from seated position without assistance, normal stance, can heel toe and tandem walk without difficulty Romberg signs: Negative Deep tendon reflexes: Brachioradialis 2/2, biceps 2/2, triceps 2/2, patellar 2/2, Achilles 2/2, plantar responses were extensor bilaterally.  DIAGNOSTIC DATA (LABS, IMAGING, TESTING) - ASSESSMENT AND PLAN 71 y.o. year old female has a past medical history of complex partial Seizure with secondary generalization. Last seizure activity in 2002. She also has history of cervical spondylosis.  Continue Lamictal and Keppra at current doses will refill Call for any seizure activity Follow-up yearly and when necessary next with Dr.  Carmelina Noun, [facility], [facility], APRN  [facility] [address] [place], [place] [phone]

## 2016-07-09 ENCOUNTER — Other Ambulatory Visit: Payer: Self-pay | Admitting: Neurology

## 2016-07-14 DIAGNOSIS — Z1231 Encounter for screening mammogram for malignant neoplasm of breast: Secondary | ICD-10-CM | POA: Diagnosis not present

## 2016-08-17 DIAGNOSIS — E7800 Pure hypercholesterolemia, unspecified: Secondary | ICD-10-CM | POA: Diagnosis not present

## 2016-08-17 DIAGNOSIS — Z789 Other specified health status: Secondary | ICD-10-CM | POA: Diagnosis not present

## 2016-08-17 DIAGNOSIS — Z6820 Body mass index (BMI) 20.0-20.9, adult: Secondary | ICD-10-CM | POA: Diagnosis not present

## 2016-08-17 DIAGNOSIS — F0280 Dementia in other diseases classified elsewhere without behavioral disturbance: Secondary | ICD-10-CM | POA: Diagnosis not present

## 2016-08-17 DIAGNOSIS — Z299 Encounter for prophylactic measures, unspecified: Secondary | ICD-10-CM | POA: Diagnosis not present

## 2016-08-17 DIAGNOSIS — F329 Major depressive disorder, single episode, unspecified: Secondary | ICD-10-CM | POA: Diagnosis not present

## 2016-08-17 DIAGNOSIS — G309 Alzheimer's disease, unspecified: Secondary | ICD-10-CM | POA: Diagnosis not present

## 2016-08-17 DIAGNOSIS — R569 Unspecified convulsions: Secondary | ICD-10-CM | POA: Diagnosis not present

## 2016-08-17 DIAGNOSIS — M1612 Unilateral primary osteoarthritis, left hip: Secondary | ICD-10-CM | POA: Diagnosis not present

## 2016-08-17 DIAGNOSIS — I10 Essential (primary) hypertension: Secondary | ICD-10-CM | POA: Diagnosis not present

## 2016-08-17 DIAGNOSIS — Z79899 Other long term (current) drug therapy: Secondary | ICD-10-CM | POA: Diagnosis not present

## 2016-11-09 DIAGNOSIS — F329 Major depressive disorder, single episode, unspecified: Secondary | ICD-10-CM | POA: Diagnosis not present

## 2016-11-09 DIAGNOSIS — E7800 Pure hypercholesterolemia, unspecified: Secondary | ICD-10-CM | POA: Diagnosis not present

## 2016-11-09 DIAGNOSIS — F0390 Unspecified dementia without behavioral disturbance: Secondary | ICD-10-CM | POA: Diagnosis not present

## 2016-11-09 DIAGNOSIS — I10 Essential (primary) hypertension: Secondary | ICD-10-CM | POA: Diagnosis not present

## 2016-11-17 DIAGNOSIS — F0280 Dementia in other diseases classified elsewhere without behavioral disturbance: Secondary | ICD-10-CM | POA: Diagnosis not present

## 2016-11-17 DIAGNOSIS — I10 Essential (primary) hypertension: Secondary | ICD-10-CM | POA: Diagnosis not present

## 2016-11-17 DIAGNOSIS — D649 Anemia, unspecified: Secondary | ICD-10-CM | POA: Diagnosis not present

## 2016-11-17 DIAGNOSIS — G309 Alzheimer's disease, unspecified: Secondary | ICD-10-CM | POA: Diagnosis not present

## 2016-11-17 DIAGNOSIS — R569 Unspecified convulsions: Secondary | ICD-10-CM | POA: Diagnosis not present

## 2016-11-17 DIAGNOSIS — Z6821 Body mass index (BMI) 21.0-21.9, adult: Secondary | ICD-10-CM | POA: Diagnosis not present

## 2016-11-17 DIAGNOSIS — Z713 Dietary counseling and surveillance: Secondary | ICD-10-CM | POA: Diagnosis not present

## 2016-11-17 DIAGNOSIS — F329 Major depressive disorder, single episode, unspecified: Secondary | ICD-10-CM | POA: Diagnosis not present

## 2016-11-17 DIAGNOSIS — Z299 Encounter for prophylactic measures, unspecified: Secondary | ICD-10-CM | POA: Diagnosis not present

## 2016-11-17 DIAGNOSIS — M25559 Pain in unspecified hip: Secondary | ICD-10-CM | POA: Diagnosis not present

## 2016-11-29 DIAGNOSIS — M1612 Unilateral primary osteoarthritis, left hip: Secondary | ICD-10-CM | POA: Diagnosis not present

## 2016-11-29 DIAGNOSIS — I10 Essential (primary) hypertension: Secondary | ICD-10-CM | POA: Diagnosis not present

## 2016-11-29 DIAGNOSIS — E7800 Pure hypercholesterolemia, unspecified: Secondary | ICD-10-CM | POA: Diagnosis not present

## 2016-11-29 DIAGNOSIS — M545 Low back pain: Secondary | ICD-10-CM | POA: Diagnosis not present

## 2016-11-29 DIAGNOSIS — M47816 Spondylosis without myelopathy or radiculopathy, lumbar region: Secondary | ICD-10-CM | POA: Diagnosis not present

## 2016-11-29 DIAGNOSIS — M549 Dorsalgia, unspecified: Secondary | ICD-10-CM | POA: Diagnosis not present

## 2016-11-29 DIAGNOSIS — Z299 Encounter for prophylactic measures, unspecified: Secondary | ICD-10-CM | POA: Diagnosis not present

## 2016-11-29 DIAGNOSIS — Z6821 Body mass index (BMI) 21.0-21.9, adult: Secondary | ICD-10-CM | POA: Diagnosis not present

## 2016-11-29 DIAGNOSIS — M1611 Unilateral primary osteoarthritis, right hip: Secondary | ICD-10-CM | POA: Diagnosis not present

## 2016-12-02 DIAGNOSIS — M4696 Unspecified inflammatory spondylopathy, lumbar region: Secondary | ICD-10-CM | POA: Diagnosis not present

## 2016-12-02 DIAGNOSIS — M47816 Spondylosis without myelopathy or radiculopathy, lumbar region: Secondary | ICD-10-CM | POA: Diagnosis not present

## 2016-12-22 DIAGNOSIS — I10 Essential (primary) hypertension: Secondary | ICD-10-CM | POA: Diagnosis not present

## 2016-12-22 DIAGNOSIS — Z299 Encounter for prophylactic measures, unspecified: Secondary | ICD-10-CM | POA: Diagnosis not present

## 2016-12-22 DIAGNOSIS — M549 Dorsalgia, unspecified: Secondary | ICD-10-CM | POA: Diagnosis not present

## 2016-12-22 DIAGNOSIS — Z6821 Body mass index (BMI) 21.0-21.9, adult: Secondary | ICD-10-CM | POA: Diagnosis not present

## 2016-12-22 DIAGNOSIS — M5431 Sciatica, right side: Secondary | ICD-10-CM | POA: Diagnosis not present

## 2017-01-04 DIAGNOSIS — M5416 Radiculopathy, lumbar region: Secondary | ICD-10-CM | POA: Diagnosis not present

## 2017-01-04 DIAGNOSIS — M545 Low back pain: Secondary | ICD-10-CM | POA: Diagnosis not present

## 2017-01-06 DIAGNOSIS — M5416 Radiculopathy, lumbar region: Secondary | ICD-10-CM | POA: Diagnosis not present

## 2017-01-06 DIAGNOSIS — M545 Low back pain: Secondary | ICD-10-CM | POA: Diagnosis not present

## 2017-01-31 DIAGNOSIS — M4716 Other spondylosis with myelopathy, lumbar region: Secondary | ICD-10-CM | POA: Diagnosis not present

## 2017-02-08 DIAGNOSIS — M545 Low back pain: Secondary | ICD-10-CM | POA: Diagnosis not present

## 2017-02-08 DIAGNOSIS — M5416 Radiculopathy, lumbar region: Secondary | ICD-10-CM | POA: Diagnosis not present

## 2017-02-22 DIAGNOSIS — Z7189 Other specified counseling: Secondary | ICD-10-CM | POA: Diagnosis not present

## 2017-02-22 DIAGNOSIS — Z79899 Other long term (current) drug therapy: Secondary | ICD-10-CM | POA: Diagnosis not present

## 2017-02-22 DIAGNOSIS — E7800 Pure hypercholesterolemia, unspecified: Secondary | ICD-10-CM | POA: Diagnosis not present

## 2017-02-22 DIAGNOSIS — Z6821 Body mass index (BMI) 21.0-21.9, adult: Secondary | ICD-10-CM | POA: Diagnosis not present

## 2017-02-22 DIAGNOSIS — E559 Vitamin D deficiency, unspecified: Secondary | ICD-10-CM | POA: Diagnosis not present

## 2017-02-22 DIAGNOSIS — Z299 Encounter for prophylactic measures, unspecified: Secondary | ICD-10-CM | POA: Diagnosis not present

## 2017-02-22 DIAGNOSIS — Z1339 Encounter for screening examination for other mental health and behavioral disorders: Secondary | ICD-10-CM | POA: Diagnosis not present

## 2017-02-22 DIAGNOSIS — Z0000 Encounter for general adult medical examination without abnormal findings: Secondary | ICD-10-CM | POA: Diagnosis not present

## 2017-02-22 DIAGNOSIS — R5383 Other fatigue: Secondary | ICD-10-CM | POA: Diagnosis not present

## 2017-02-22 DIAGNOSIS — Z1211 Encounter for screening for malignant neoplasm of colon: Secondary | ICD-10-CM | POA: Diagnosis not present

## 2017-02-22 DIAGNOSIS — Z1331 Encounter for screening for depression: Secondary | ICD-10-CM | POA: Diagnosis not present

## 2017-02-23 DIAGNOSIS — Q7649 Other congenital malformations of spine, not associated with scoliosis: Secondary | ICD-10-CM | POA: Diagnosis not present

## 2017-02-23 DIAGNOSIS — M4716 Other spondylosis with myelopathy, lumbar region: Secondary | ICD-10-CM | POA: Diagnosis not present

## 2017-02-23 DIAGNOSIS — M545 Low back pain: Secondary | ICD-10-CM | POA: Diagnosis not present

## 2017-02-23 DIAGNOSIS — N2889 Other specified disorders of kidney and ureter: Secondary | ICD-10-CM | POA: Diagnosis not present

## 2017-02-23 DIAGNOSIS — M419 Scoliosis, unspecified: Secondary | ICD-10-CM | POA: Diagnosis not present

## 2017-02-23 DIAGNOSIS — M48061 Spinal stenosis, lumbar region without neurogenic claudication: Secondary | ICD-10-CM | POA: Diagnosis not present

## 2017-02-23 DIAGNOSIS — M4807 Spinal stenosis, lumbosacral region: Secondary | ICD-10-CM | POA: Diagnosis not present

## 2017-03-20 DIAGNOSIS — M47816 Spondylosis without myelopathy or radiculopathy, lumbar region: Secondary | ICD-10-CM | POA: Diagnosis not present

## 2017-03-20 DIAGNOSIS — M4716 Other spondylosis with myelopathy, lumbar region: Secondary | ICD-10-CM | POA: Diagnosis not present

## 2017-03-23 DIAGNOSIS — M79661 Pain in right lower leg: Secondary | ICD-10-CM | POA: Diagnosis not present

## 2017-04-19 DIAGNOSIS — E7800 Pure hypercholesterolemia, unspecified: Secondary | ICD-10-CM | POA: Diagnosis not present

## 2017-04-19 DIAGNOSIS — F0390 Unspecified dementia without behavioral disturbance: Secondary | ICD-10-CM | POA: Diagnosis not present

## 2017-04-19 DIAGNOSIS — F329 Major depressive disorder, single episode, unspecified: Secondary | ICD-10-CM | POA: Diagnosis not present

## 2017-04-19 DIAGNOSIS — I10 Essential (primary) hypertension: Secondary | ICD-10-CM | POA: Diagnosis not present

## 2017-05-05 DIAGNOSIS — F329 Major depressive disorder, single episode, unspecified: Secondary | ICD-10-CM | POA: Diagnosis not present

## 2017-05-05 DIAGNOSIS — F0390 Unspecified dementia without behavioral disturbance: Secondary | ICD-10-CM | POA: Diagnosis not present

## 2017-05-05 DIAGNOSIS — I10 Essential (primary) hypertension: Secondary | ICD-10-CM | POA: Diagnosis not present

## 2017-05-05 DIAGNOSIS — E7800 Pure hypercholesterolemia, unspecified: Secondary | ICD-10-CM | POA: Diagnosis not present

## 2017-06-13 DIAGNOSIS — M47816 Spondylosis without myelopathy or radiculopathy, lumbar region: Secondary | ICD-10-CM | POA: Diagnosis not present

## 2017-07-05 ENCOUNTER — Other Ambulatory Visit (INDEPENDENT_AMBULATORY_CARE_PROVIDER_SITE_OTHER): Payer: PPO

## 2017-07-05 ENCOUNTER — Telehealth: Payer: Self-pay | Admitting: Neurology

## 2017-07-05 ENCOUNTER — Ambulatory Visit: Payer: PPO | Admitting: Neurology

## 2017-07-05 ENCOUNTER — Encounter: Payer: Self-pay | Admitting: Neurology

## 2017-07-05 VITALS — BP 128/67 | HR 85 | Ht 61.0 in | Wt 101.5 lb

## 2017-07-05 DIAGNOSIS — R413 Other amnesia: Secondary | ICD-10-CM

## 2017-07-05 DIAGNOSIS — Z79899 Other long term (current) drug therapy: Secondary | ICD-10-CM | POA: Diagnosis not present

## 2017-07-05 DIAGNOSIS — R569 Unspecified convulsions: Secondary | ICD-10-CM | POA: Diagnosis not present

## 2017-07-05 DIAGNOSIS — Z0289 Encounter for other administrative examinations: Secondary | ICD-10-CM

## 2017-07-05 DIAGNOSIS — G3184 Mild cognitive impairment, so stated: Secondary | ICD-10-CM

## 2017-07-05 MED ORDER — MEMANTINE HCL 10 MG PO TABS
10.0000 mg | ORAL_TABLET | Freq: Two times a day (BID) | ORAL | 11 refills | Status: DC
Start: 2017-07-05 — End: 2017-10-26

## 2017-07-05 NOTE — Progress Notes (Signed)
[facility]  PATIENT: Sheri Valencia DOB: [birth year]   HISTORY OF PRESENT ILLNESS:Sheri Valencia is a 72 years old right-handed Caucasian female,followed for complex partial seizure with secondary generalization. She has a history of complex partial seizure with secondary generalization since age 84, presented with rising sensation, then passed out followed by generalized motor seizure, last seizure was in 2002, she was treated with Lamictal 200 mg twice a day, continue to have recurrent seizure, Keppra 500 mg 2 tablets twice a day was added on, no recurrent seizure since. 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 Other levels alsodemonstrate significant stenosis.. She denies significant neck pain She has mild unsteady gait, always contributed to her left hip problem,  She was evaluated recently  by Dr. Channing Mutters, neurosurgeon, she follows up yearly   She returns for reevaluation and refills. She is on Aricept by PCP Dr. [name].   UPDATE Jul 05 2017:  I was able to review the matter from her  Sister Sheri Valencia, she is accompanied by her friend [name] at today's clinical visit,  "From her sister's letter: Sheri Valencia continue has memory problem, easily gets confused, gets frustrated easily, but deny her problem, there was also description of paranoid, emotional incontinence, forgetful, poor decision-making process for her health, irrational spell, recent purchase of a new car, but difficult to operating her new  vehicle, impulsive action, got lost while driving, difficulty working with her computer, loose piece constantly, difficulty managing her bills, ongoing since 2017, progressively getting worse"  [name] drove her here today, she has lived [place] for 50 years, 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   She likes to walk, reading.  She lives in a codominum.   She has not have seizure for few years. 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 Ativan [Lorazepam]     confusion    HOME MEDICATIONS: Outpatient Medications Prior to Visit  Medication Sig Dispense Refill  . aspirin 81 MG tablet Take 81 mg by mouth daily.    . Calcium Carbonate-Vit D-Min (CALTRATE PLUS PO) Take by mouth daily.    Marland Kitchen lamoTRIgine (LAMICTAL) 200 MG tablet Take 1 tablet (200 mg total) by mouth 2 (two) times daily. 180 tablet 3  . levETIRAcetam (KEPPRA) 500 MG tablet TAKE 2 TABLETS BY MOUTH TWICE DAILY. 360 tablet 3  . lisinopril (PRINIVIL,ZESTRIL) 20 MG  tablet Take 20 mg by mouth daily.    . Multiple Vitamin (VITAMIN E/FOLIC ACID/B-6/B-12) CAPS Take by mouth daily.    . pravastatin  (PRAVACHOL) 20 MG tablet     . propranolol (INDERAL) 20 MG tablet 20 mg daily.    . sertraline (ZOLOFT) 100 MG tablet Take 100 mg by mouth daily.    Marland Kitchen VITAMIN D, ERGOCALCIFEROL, PO Take by mouth daily.    Marland Kitchen amLODipine (NORVASC) 5 MG tablet Take 5 mg by mouth daily.    Marland Kitchen donepezil (ARICEPT) 5 MG tablet      No facility-administered medications prior to visit.     PAST MEDICAL HISTORY: Past Medical History:  Diagnosis Date  . Memory loss   . Seizure (HCC)     PAST SURGICAL HISTORY: Past Surgical History:  Procedure Laterality Date  . APPENDECTOMY    . TUBAL LIGATION      FAMILY HISTORY: Family History  Problem Relation Age of Onset  . Pneumonia Mother   . Heart attack Maternal Grandmother   . Stroke Maternal Grandmother   . Suicidality Brother   . Aneurysm Maternal Aunt   . Breast cancer Sister        04/2015  Stage I    SOCIAL HISTORY: Social History   Socioeconomic History  . Marital status: Widowed    Spouse name: Not on file  . Number of children: 0  . Years of education: college  . Highest education level: Not on file  Occupational History    Employer: RETIRED    Comment: retired  Engineer, production  . Financial resource strain: Not on file  . Food insecurity:    Worry: Not on file    Inability: Not on file  . Transportation needs:    Medical: Not on file    Non-medical: Not on file  Tobacco Use  . Smoking status: Never Smoker  . Smokeless tobacco: Never Used  Substance and Sexual Activity  . Alcohol use: Yes    Alcohol/week: 0.6 oz    Types: 1 Glasses of wine per week  . Drug use: No  . Sexual activity: Not on file  Lifestyle  . Physical activity:    Days per week: Not on file    Minutes per session: Not on file  . Stress: Not on file  Relationships  . Social connections:    Talks on phone: Not on file    Gets together: Not on file    Attends religious service: Not on file    Active member of club or organization: Not on file    Attends  meetings of clubs or organizations: Not on file    Relationship status: Not on file  . Intimate partner violence:    Fear of current or ex partner: Not on file    Emotionally abused: Not on file    Physically abused: Not on file    Forced sexual activity: Not on file  Other Topics Concern  . Not on file  Social History Narrative   Patient lives at home alone and she is widowed. Retired.   College education    Caffeine one cup daily.           PHYSICAL EXAM  Vitals:   07/05/17 1108  BP: 128/67  Pulse: 85  Weight: 101 lb 8 oz (46 kg)  Height: 5\' 1"  (1.549 m)   Body mass index is 19.18 kg/m. Generalized: In no acute distress Neck: Supple, no carotid bruits  Musculoskeletal: No  deformity  Neurological examination MMSE - Mini Mental State Exam 07/05/2017  Orientation to time 5  Orientation to Place 5  Registration 3  Attention/ Calculation 5  Recall 3  Language- name 2 objects 2  Language- repeat 1  Language- follow 3 step command 3  Language- read & follow direction 1  Write a sentence 1  Copy design 1  Total score 30   Cranial nerve II-XII: Pupils were equal round reactive to light. Extraocular movements were full. Visual field were full on confrontational test. Bilateral fundi were sharp. Facial sensation and strength were normal. Hearing was intact to finger rubbing bilaterally. Uvula tongue midline. Head turning and shoulder shrug and were normal and symmetric.Tongue protrusion into cheek strength was normal. Motor: Mild bilateral hip flexion weakness, otherwise good strength in the upper and lower extremities. Sensory: Intact to fine touch, pinprick, preserved vibratory sensation In the upper and lower extremities,  Coordination: Normal finger to nose, heel-to-shin bilaterally Gait: Scoliosis, mild left shoulder elevation, lean towards the right side Deep tendon reflexes: Brachioradialis 2/2, biceps 2/2, triceps 2/2, patellar 2/2, Achilles 2/2, plantar  responses were extensor bilaterally.  DIAGNOSTIC DATA (LABS, IMAGING, TESTING) - ASSESSMENT AND PLAN 73 y.o. year old female  Mild cognitive impairment Complex partial seizure with secondary generalization, last reported seizure was in 2002, Polypharmacy treatment, Depression  With her progressive worsening memory loss,  Proceed with MRI of the brain  Laboratory evaluations  Repeat EEG,  If there is no significant abnormality on EEG, may consider taper off Keppra, keep lamotrigine 200 mg twice a day,  Add on Namenda 10 mg twice a day   Levert Feinstein, M.D. Ph.D.  [facility] [address] [place], [place] [address] Phone: [phone] Fax:      [phone]

## 2017-07-05 NOTE — Telephone Encounter (Signed)
Sending to [employer] to see if patient needs PA.  Telephone [phone] - fax [phone]. Arma Heading    Called patient she is aware of details and she is aware if she is approved that [facility] will call her to schedule.

## 2017-07-06 NOTE — Telephone Encounter (Signed)
[employer] Berkley Harvey [id number] (10/04/17). Patient sent to [facility]. DW

## 2017-07-07 DIAGNOSIS — F329 Major depressive disorder, single episode, unspecified: Secondary | ICD-10-CM | POA: Diagnosis not present

## 2017-07-07 DIAGNOSIS — F0390 Unspecified dementia without behavioral disturbance: Secondary | ICD-10-CM | POA: Diagnosis not present

## 2017-07-07 DIAGNOSIS — E7800 Pure hypercholesterolemia, unspecified: Secondary | ICD-10-CM | POA: Diagnosis not present

## 2017-07-07 DIAGNOSIS — I10 Essential (primary) hypertension: Secondary | ICD-10-CM | POA: Diagnosis not present

## 2017-07-07 LAB — COMPREHENSIVE METABOLIC PANEL
ALBUMIN: 4.3 g/dL (ref 3.6–4.8)
ALT: 12 IU/L (ref 0–32)
AST: 23 IU/L (ref 0–40)
Albumin/Globulin Ratio: 1.6 (ref 1.2–2.2)
Alkaline Phosphatase: 127 IU/L — ABNORMAL HIGH (ref 39–117)
BILIRUBIN TOTAL: 0.2 mg/dL (ref 0.0–1.2)
BUN / CREAT RATIO: 14 (ref 12–28)
BUN: 17 mg/dL (ref 8–27)
CALCIUM: 9.2 mg/dL (ref 8.7–10.3)
CHLORIDE: 99 mmol/L (ref 96–106)
CO2: 24 mmol/L (ref 20–29)
Creatinine, Ser: 1.25 mg/dL — ABNORMAL HIGH (ref 0.57–1.00)
GFR, EST AFRICAN AMERICAN: 51 mL/min/{1.73_m2} — AB (ref >59)
GFR, EST NON AFRICAN AMERICAN: 44 mL/min/{1.73_m2} — AB (ref >59)
Globulin, Total: 2.7 g/dL (ref 1.5–4.5)
Glucose: 137 mg/dL — ABNORMAL HIGH (ref 65–99)
Potassium: 5.4 mmol/L — ABNORMAL HIGH (ref 3.5–5.2)
Sodium: 137 mmol/L (ref 134–144)
TOTAL PROTEIN: 7 g/dL (ref 6.0–8.5)

## 2017-07-07 LAB — CBC WITH DIFFERENTIAL/PLATELET
BASOS ABS: 0 10*3/uL (ref 0.0–0.2)
BASOS: 0 %
EOS (ABSOLUTE): 0.1 10*3/uL (ref 0.0–0.4)
Eos: 1 %
HEMOGLOBIN: 11.1 g/dL (ref 11.1–15.9)
Hematocrit: 34.9 % (ref 34.0–46.6)
IMMATURE GRANS (ABS): 0 10*3/uL (ref 0.0–0.1)
IMMATURE GRANULOCYTES: 0 %
LYMPHS: 13 %
Lymphocytes Absolute: 1 10*3/uL (ref 0.7–3.1)
MCH: 27.5 pg (ref 26.6–33.0)
MCHC: 31.8 g/dL (ref 31.5–35.7)
MCV: 86 fL (ref 79–97)
MONOCYTES: 11 %
Monocytes Absolute: 0.8 10*3/uL (ref 0.1–0.9)
NEUTROS ABS: 5.7 10*3/uL (ref 1.4–7.0)
NEUTROS PCT: 75 %
Platelets: 301 10*3/uL (ref 150–379)
RBC: 4.04 x10E6/uL (ref 3.77–5.28)
RDW: 13.6 % (ref 12.3–15.4)
WBC: 7.6 10*3/uL (ref 3.4–10.8)

## 2017-07-07 LAB — RPR: RPR Ser Ql: NONREACTIVE

## 2017-07-07 LAB — VITAMIN B12: Vitamin B-12: 865 pg/mL (ref 232–1245)

## 2017-07-07 LAB — LEVETIRACETAM LEVEL: LEVETIRACETAM: 66.5 ug/mL — AB (ref 10.0–40.0)

## 2017-07-07 LAB — TSH: TSH: 1.9 u[IU]/mL (ref 0.450–4.500)

## 2017-07-07 LAB — LAMOTRIGINE LEVEL: LAMOTRIGINE LVL: 22.7 ug/mL — AB (ref 2.0–20.0)

## 2017-07-08 ENCOUNTER — Telehealth: Payer: Self-pay | Admitting: Neurology

## 2017-07-08 ENCOUNTER — Other Ambulatory Visit: Payer: Self-pay | Admitting: Neurology

## 2017-07-08 NOTE — Telephone Encounter (Signed)
Please call patient,   Lab evaluation showed elevated creatinine, indicating decreased kidney function, GFR of 44,   There is also evidence of elevated lamotrigine level 23, Keppra 67, this might be related to the timing of last dose of medication, and the blood sample,  Rest of the laboratory evaluations were normal,  Please double check with patient, if she has not had seizure for many years, go ahead and decrease Keppra from 500 mg 2 tablets twice a day to 1 tablets twice a day, keep current dose of lamotrigine 200 mg twice a day

## 2017-07-10 NOTE — Telephone Encounter (Signed)
I got in touch with her sister on DPR who will let the patient know to call me.

## 2017-07-10 NOTE — Telephone Encounter (Signed)
Attempted to reach patient again on both listed numbers.  No answer and not able to leave a message.

## 2017-07-10 NOTE — Telephone Encounter (Signed)
Unable to reach patient at home or mobile numbers.  Both have voicemail that is either full or not set up.  I will try to reach her again.

## 2017-07-10 NOTE — Telephone Encounter (Signed)
Noted thank you

## 2017-07-11 MED ORDER — LEVETIRACETAM 500 MG PO TABS: ORAL_TABLET | ORAL | 3 refills | Status: DC

## 2017-07-11 NOTE — Addendum Note (Signed)
Addended by: Lindell Spar C on: 07/11/2017 09:25 AM   Modules accepted: Orders

## 2017-07-11 NOTE — Telephone Encounter (Signed)
Spoke to patient - she is aware of her lab results.  Reports her last seizure occurred 15+ years ago.  She is agreeable to reduce her Keppra dosage as instructed below.  She will continue her current Lamictal dose.  New rx sent to the pharmacy.  Patient will call us with any concerns.

## 2017-07-17 ENCOUNTER — Other Ambulatory Visit: Payer: Self-pay

## 2017-07-18 ENCOUNTER — Ambulatory Visit
Admission: RE | Admit: 2017-07-18 | Discharge: 2017-07-18 | Disposition: A | Discharge status: Home / Self Care | Payer: Self-pay | Source: Ambulatory Visit | Attending: Neurology | Admitting: Neurology

## 2017-07-18 DIAGNOSIS — R413 Other amnesia: Secondary | ICD-10-CM | POA: Diagnosis not present

## 2017-07-21 ENCOUNTER — Telehealth: Payer: Self-pay | Admitting: *Deleted

## 2017-07-21 NOTE — Telephone Encounter (Signed)
-----   Message from Levert Feinstein, MD sent at 07/21/2017 12:23 PM EDT ----- Please call pt for normal MRI brain.

## 2017-07-21 NOTE — Telephone Encounter (Signed)
Spoke to patient she is aware of the results ?

## 2017-07-24 ENCOUNTER — Telehealth: Payer: Self-pay | Admitting: *Deleted

## 2017-07-24 NOTE — Telephone Encounter (Signed)
-----   Message from Levert Feinstein, MD sent at 07/24/2017  5:30 PM EDT ----- Please call patient for essentially normal MRI brain

## 2017-07-25 ENCOUNTER — Encounter: Payer: Self-pay | Admitting: *Deleted

## 2017-07-25 NOTE — Telephone Encounter (Signed)
I attempted, multiple times, to reach patient at both listed numbers.  There was no answer and I was unable to leave a voicemail.  If she calls back, we are happy to discuss her results.  I will also mail her an "unable to contact" letter.

## 2017-07-28 ENCOUNTER — Other Ambulatory Visit: Payer: PPO

## 2017-08-03 NOTE — Telephone Encounter (Signed)
Pt returned RN's call. Msg relayed, she understood and had no questions.

## 2017-08-29 ENCOUNTER — Ambulatory Visit: Payer: PPO | Admitting: Neurology

## 2017-08-29 DIAGNOSIS — G3184 Mild cognitive impairment, so stated: Secondary | ICD-10-CM

## 2017-08-29 DIAGNOSIS — R569 Unspecified convulsions: Secondary | ICD-10-CM

## 2017-08-29 DIAGNOSIS — R413 Other amnesia: Secondary | ICD-10-CM

## 2017-09-01 NOTE — Procedures (Signed)
   HISTORY:  TECHNIQUE:  16 channel EEG was performed based on standard 10-16 international system. One channel was dedicated to EKG, which has demonstrates normal sinus rhythm of 84 beats per minutes.  Upon awakening, the posterior background activity was mildly dysrhythmic, in alpha range, 9 Hz, reactive to eye opening and closure.  There was frequent muscle artifact.   There was no evidence of epileptiform discharge.  Photic stimulation was performed, which induced a symmetric photic driving.  Hyperventilation was performed, there was no abnormality elicit.  No sleep was achieved.  CONCLUSION: This is a  normal awake EEG.  There is no electrodiagnostic evidence of epileptiform discharge.  Levert FeinsteinYijun Seanmichael Salmons, M.D. Ph.D.  [facility] [address] [place], [place] [address] Phone: [phone] Fax:      [phone]

## 2017-09-06 ENCOUNTER — Telehealth: Payer: Self-pay | Admitting: Neurology

## 2017-09-06 NOTE — Telephone Encounter (Signed)
The EEG results are normal.  Her [name] has been informed and verbalized understanding.  The patient will keep her follow up on 10/26/17 for further discussion of medications.

## 2017-09-06 NOTE — Telephone Encounter (Signed)
Patient's [name] ([place] calling to get EEG results.

## 2017-10-14 ENCOUNTER — Other Ambulatory Visit: Payer: Self-pay | Admitting: Neurology

## 2017-10-16 DIAGNOSIS — F0390 Unspecified dementia without behavioral disturbance: Secondary | ICD-10-CM | POA: Diagnosis not present

## 2017-10-16 DIAGNOSIS — F329 Major depressive disorder, single episode, unspecified: Secondary | ICD-10-CM | POA: Diagnosis not present

## 2017-10-16 DIAGNOSIS — I10 Essential (primary) hypertension: Secondary | ICD-10-CM | POA: Diagnosis not present

## 2017-10-16 DIAGNOSIS — E7800 Pure hypercholesterolemia, unspecified: Secondary | ICD-10-CM | POA: Diagnosis not present

## 2017-10-26 ENCOUNTER — Encounter: Payer: Self-pay | Admitting: Neurology

## 2017-10-26 ENCOUNTER — Ambulatory Visit (INDEPENDENT_AMBULATORY_CARE_PROVIDER_SITE_OTHER): Payer: PPO | Admitting: Neurology

## 2017-10-26 VITALS — BP 169/72 | HR 69 | Ht 61.0 in | Wt 101.0 lb

## 2017-10-26 DIAGNOSIS — R569 Unspecified convulsions: Secondary | ICD-10-CM | POA: Diagnosis not present

## 2017-10-26 MED ORDER — LAMOTRIGINE 200 MG PO TABS: 200.0000 mg | ORAL_TABLET | Freq: Two times a day (BID) | ORAL | 4 refills | Status: DC

## 2017-10-26 MED ORDER — MEMANTINE HCL 10 MG PO TABS: 10.0000 mg | ORAL_TABLET | Freq: Two times a day (BID) | ORAL | 4 refills | Status: DC

## 2017-10-26 NOTE — Progress Notes (Signed)
[facility]  PATIENT: Sheri Valencia DOB: 30-Dec-1945   HISTORY OF PRESENT ILLNESS:Sheri Valencia is a 72 years old right-handed Caucasian female,followed for complex partial seizure with secondary generalization. She has a history of complex partial seizure with secondary generalization since age 30, presented with rising sensation, then passed out followed by generalized motor seizure, last seizure was in 2002, she was treated with Lamictal 200 mg twice a day, continue to have recurrent seizure, Keppra 500 mg 2 tablets twice a day was added on, no recurrent seizure since. 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 Other levels alsodemonstrate significant stenosis.. She denies significant neck pain She has mild unsteady gait, always contributed to her left hip problem,  She was evaluated recently  by Sheri Valencia, neurosurgeon, she follows up yearly   She returns for reevaluation and refills. She is on Aricept by PCP Sheri Valencia.   UPDATE Jul 05 2017:  I was able to review the letter from her  Sister Sheri Valencia, she is accompanied by her friend Sheri Valencia at today's clinical visit,  "From her sister's letter: Sheri Valencia continue has memory problem, easily gets confused, gets frustrated easily, but deny her problem, there was also description of paranoid, emotional incontinence, forgetful, poor decision-making process for her health, irrational spell, recent purchase of a new car, but difficult to operating her new  vehicle, impulsive action, got lost while driving, difficulty working with her computer, loose piece constantly, difficulty managing her bills, ongoing since 2017, progressively getting worse"  Sheri Valencia drove her here today, she has lived [place] for 50 years, 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   She likes to walk, reading.  She lives in a codominum.   She has not have seizure for few years. 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  UPDATE October 26 2017: She was able to taper off Keppra 500 mg twice daily, only on lamotrigine 200 mg twice a day, doing better, along with Namenda 10 mg twice a day, her memory seems to improve will stabilize, care of her elderly aunt,  We personally reviewed MRI of the brain in May 2019, mild generalized atrophy no acute abnormality,  EEG was normal   Laboratory evaluation in May 2019 showed normal TSH, B12, RPR, CBC, CMP showed mild elevated 1.25, with GFR of 44, Keppra level was 66, lamotrigine level was 22, she has no signs of toxicity, the level was not trough level  REVIEW OF SYSTEMS: Full 14 system review of systems performed and notable only for those listed, all others  are neg: Constipation, memory loss     ALLERGIES: Allergies  Allergen Reactions  . Ativan [Lorazepam]      confusion    HOME MEDICATIONS: Outpatient Medications Prior to Visit  Medication Sig Dispense Refill  . aspirin 81 MG tablet Take 81 mg by mouth daily.    . Calcium Carbonate-Vit D-Min (CALTRATE PLUS PO) Take by mouth daily.    Marland Kitchen lamoTRIgine (LAMICTAL) 200 MG tablet TAKE (1) TABLET TWICE DAILY. 60 tablet 3  . levETIRAcetam (KEPPRA) 500 MG tablet TAKE 1 TABLETS BY MOUTH TWICE DAILY. 180 tablet 3  . lisinopril (PRINIVIL,ZESTRIL) 20 MG tablet Take 20 mg by mouth daily.    . memantine (NAMENDA) 10 MG tablet Take 1 tablet (10 mg total) by mouth 2 (two) times daily. 60 tablet 11  . Multiple Vitamin (VITAMIN E/FOLIC ACID/B-6/B-12) CAPS Take by mouth daily.    . pravastatin (PRAVACHOL) 20 MG tablet     . propranolol (INDERAL) 20 MG tablet 20 mg daily.    . sertraline (ZOLOFT) 100 MG tablet Take 100 mg by mouth daily.    Marland Kitchen VITAMIN D, ERGOCALCIFEROL, PO Take by mouth daily.     No facility-administered medications prior to visit.     PAST MEDICAL HISTORY: Past Medical History:  Diagnosis Date  . Memory loss   . Seizure (HCC)     PAST SURGICAL HISTORY: Past Surgical History:  Procedure Laterality Date  . APPENDECTOMY    . TUBAL LIGATION      FAMILY HISTORY: Family History  Problem Relation Age of Onset  . Pneumonia Mother   . Heart attack Maternal Grandmother   . Stroke Maternal Grandmother   . Suicidality Brother   . Aneurysm Maternal Aunt   . Breast cancer Sister        04/2015  Stage I    SOCIAL HISTORY: Social History   Socioeconomic History  . Marital status: Widowed    Spouse name: Not on file  . Number of children: 0  . Years of education: college  . Highest education level: Not on file  Occupational History    Employer: RETIRED    Comment: retired  Engineer, production  . Financial resource strain: Not on file  . Food insecurity:    Worry: Not on file    Inability: Not on file  . Transportation needs:    Medical: Not on file    Non-medical: Not on file  Tobacco  Use  . Smoking status: Never Smoker  . Smokeless tobacco: Never Used  Substance and Sexual Activity  . Alcohol use: Yes    Alcohol/week: 1.0 standard drinks    Types: 1 Glasses of wine per week  . Drug use: No  . Sexual activity: Not on file  Lifestyle  . Physical activity:    Days per week: Not on file    Minutes per session: Not on file  . Stress: Not on file  Relationships  . Social connections:    Talks on phone: Not on file    Gets together: Not on file    Attends religious service: Not on file    Active member of club or organization: Not on file    Attends meetings of clubs or organizations: Not on file    Relationship status: Not on file  . Intimate partner violence:    Fear of current or ex partner: Not on file    Emotionally abused: Not on file    Physically abused: Not on file  Forced sexual activity: Not on file  Other Topics Concern  . Not on file  Social History Narrative   Patient lives at home alone and she is widowed. Retired.   College education    Caffeine one cup daily.           PHYSICAL EXAM  Vitals:   10/26/17 1127  BP: (!) 169/72  Pulse: 69  Weight: 101 lb (45.8 kg)  Height: 5\' 1"  (1.549 m)   Body mass index is 19.08 kg/m. Generalized: In no acute distress Neck: Supple, no carotid bruits  Musculoskeletal: No deformity  Neurological examination MMSE - Mini Mental State Exam 07/05/2017  Orientation to time 5  Orientation to Place 5  Registration 3  Attention/ Calculation 5  Recall 3  Language- name 2 objects 2  Language- repeat 1  Language- follow 3 step command 3  Language- read & follow direction 1  Write a sentence 1  Copy design 1  Total score 30   Cranial nerve II-XII: Pupils were equal round reactive to light. Extraocular movements were full. Visual field were full on confrontational test. Bilateral fundi were sharp. Facial sensation and strength were normal. Hearing was intact to finger rubbing bilaterally. Uvula  tongue midline. Head turning and shoulder shrug and were normal and symmetric.Tongue protrusion into cheek strength was normal. Motor: Mild bilateral hip flexion weakness, otherwise good strength in the upper and lower extremities. Sensory: Intact to fine touch, pinprick, preserved vibratory sensation In the upper and lower extremities,  Coordination: Normal finger to nose, heel-to-shin bilaterally Gait: Scoliosis, mild left shoulder elevation, lean towards the right side Deep tendon reflexes: Brachioradialis 2/2, biceps 2/2, triceps 2/2, patellar 2/2, Achilles 2/2, plantar responses were extensor bilaterally.  DIAGNOSTIC DATA (LABS, IMAGING, TESTING) - ASSESSMENT AND PLAN 72 y.o. year old female  Mild cognitive impairment Complex partial seizure with secondary generalization, last reported seizure was in 2002, Polypharmacy treatment, Depression  Keep lamotrigine 200mg  bid,   and Namenda 10 mg twice a day   Levert Feinstein, M.D. Ph.D.  [facility] [address] [place], [place] [address] Phone: [phone] Fax:      [phone]

## 2017-12-01 DIAGNOSIS — F329 Major depressive disorder, single episode, unspecified: Secondary | ICD-10-CM | POA: Diagnosis not present

## 2017-12-01 DIAGNOSIS — E7800 Pure hypercholesterolemia, unspecified: Secondary | ICD-10-CM | POA: Diagnosis not present

## 2017-12-01 DIAGNOSIS — I10 Essential (primary) hypertension: Secondary | ICD-10-CM | POA: Diagnosis not present

## 2017-12-01 DIAGNOSIS — F0390 Unspecified dementia without behavioral disturbance: Secondary | ICD-10-CM | POA: Diagnosis not present

## 2017-12-19 DIAGNOSIS — M47816 Spondylosis without myelopathy or radiculopathy, lumbar region: Secondary | ICD-10-CM | POA: Diagnosis not present

## 2017-12-19 DIAGNOSIS — M4316 Spondylolisthesis, lumbar region: Secondary | ICD-10-CM | POA: Diagnosis not present

## 2017-12-19 DIAGNOSIS — M545 Low back pain: Secondary | ICD-10-CM | POA: Diagnosis not present

## 2017-12-19 DIAGNOSIS — M48062 Spinal stenosis, lumbar region with neurogenic claudication: Secondary | ICD-10-CM | POA: Diagnosis not present

## 2017-12-29 DIAGNOSIS — I10 Essential (primary) hypertension: Secondary | ICD-10-CM | POA: Diagnosis not present

## 2017-12-29 DIAGNOSIS — F0390 Unspecified dementia without behavioral disturbance: Secondary | ICD-10-CM | POA: Diagnosis not present

## 2017-12-29 DIAGNOSIS — E7800 Pure hypercholesterolemia, unspecified: Secondary | ICD-10-CM | POA: Diagnosis not present

## 2017-12-29 DIAGNOSIS — F329 Major depressive disorder, single episode, unspecified: Secondary | ICD-10-CM | POA: Diagnosis not present

## 2018-01-02 ENCOUNTER — Telehealth: Payer: Self-pay | Admitting: Neurology

## 2018-01-02 NOTE — Telephone Encounter (Signed)
Patient states she is having a sensation that she is going to have a seizure since cutting back on  levETIRAcetam (KEPPRA) 500 MG tablet to 2 tablets a day. Can she go back to taking 4 tablets a day? Also when she takes memantine (NAMENDA) 10 MG tablet at night she has trouble sleeping and when she does sleep has crazy dreams. Please call and discuss.

## 2018-01-02 NOTE — Telephone Encounter (Addendum)
Spoke to patient - she has already increased her Keppra 500mg  back to two tablets twice daily.  No seizure activity reported.  She was just concerned about having one because of the way she was feeling recently.  She has been on memantine since 10/2017 and feels the medication has been helpful. Her vivid dreams just started two weeks ago.

## 2018-01-03 NOTE — Telephone Encounter (Signed)
Since the patient increased her dose of Keppra, she will run out early.  I spoke to the pharmacist at [facility].  He is going to try to get an early fill override with her insurance plan.  If not, he can provide her with the additional days needed at a low cash price.  I called the patient back and left his information on her voicemail.

## 2018-01-03 NOTE — Telephone Encounter (Addendum)
Dr. Terrace Arabia has reviewed this patient's chart.  Per her vo, the patient should do the following:  1) Cut her Keppra dose back down to 500mg , one tablet twice daily.   The patient has not had a seizure in 17 years.  She had a recent EEG that was normal.  She is also taking Lamictal 200mg  BID.  She only weighs 101 lbs and has concerns over memory loss.  Spoke to the patient and she is agreeable to start the 500mg  BID again.  She has already taken 1000mg  this morning but will reduce her evening dose to just 500mg .  She will only take Keppra 500mg  BID thereafter.  She will also continue taking Lamictal 200mg  BID. She will call us to report any seizure activity.  2) Continue memantine 10mg  BID if she feels it is helpful.  She may stop if she is uncomfortable taking it.  However, vivid dreams are not a usual side effect.  She has been on the this medication since 10/2017 and vivid dreams are not a usually reported side effect with this medication.  Additionally, her vivid dreams just started two weeks ago. It is recommended she continue taking it, since she feels it is helpful.  She can stop it if she feels uncomfortable to continue.  She reported to me that she plans to stay on the medication.

## 2018-01-11 DIAGNOSIS — M48062 Spinal stenosis, lumbar region with neurogenic claudication: Secondary | ICD-10-CM | POA: Diagnosis not present

## 2018-01-11 DIAGNOSIS — M4316 Spondylolisthesis, lumbar region: Secondary | ICD-10-CM | POA: Diagnosis not present

## 2018-06-22 DIAGNOSIS — Z6821 Body mass index (BMI) 21.0-21.9, adult: Secondary | ICD-10-CM | POA: Diagnosis not present

## 2018-06-22 DIAGNOSIS — J302 Other seasonal allergic rhinitis: Secondary | ICD-10-CM | POA: Diagnosis not present

## 2018-06-22 DIAGNOSIS — S80819A Abrasion, unspecified lower leg, initial encounter: Secondary | ICD-10-CM | POA: Diagnosis not present

## 2018-06-22 DIAGNOSIS — I10 Essential (primary) hypertension: Secondary | ICD-10-CM | POA: Diagnosis not present

## 2018-06-22 DIAGNOSIS — Z299 Encounter for prophylactic measures, unspecified: Secondary | ICD-10-CM | POA: Diagnosis not present

## 2018-06-29 DIAGNOSIS — F0390 Unspecified dementia without behavioral disturbance: Secondary | ICD-10-CM | POA: Diagnosis not present

## 2018-06-29 DIAGNOSIS — I10 Essential (primary) hypertension: Secondary | ICD-10-CM | POA: Diagnosis not present

## 2018-06-29 DIAGNOSIS — F329 Major depressive disorder, single episode, unspecified: Secondary | ICD-10-CM | POA: Diagnosis not present

## 2018-06-29 DIAGNOSIS — E7800 Pure hypercholesterolemia, unspecified: Secondary | ICD-10-CM | POA: Diagnosis not present

## 2018-07-10 ENCOUNTER — Telehealth: Payer: Self-pay | Admitting: Neurology

## 2018-07-10 NOTE — Telephone Encounter (Signed)
Pt sister MyraTudors, on [place] called stating that the pt is having trouble still remembering things and that she notices that the pt is slurring her words. Sister states that the memantine (NAMENDA) 10 MG tablet is not working anymore. Pt's sister is worried that pt may have had a stroke or something along the lines. Please advise.

## 2018-07-10 NOTE — Telephone Encounter (Signed)
Returned call to, Sheri Valencia (on [place]) who feels her sister needs to be evaluated sooner due to worsening memory.  She has been scheduled for a video visit on 07/11/2018.  Her sister will also be present during the appt.

## 2018-07-11 ENCOUNTER — Ambulatory Visit (INDEPENDENT_AMBULATORY_CARE_PROVIDER_SITE_OTHER): Payer: PPO | Admitting: Neurology

## 2018-07-11 ENCOUNTER — Encounter: Payer: Self-pay | Admitting: Neurology

## 2018-07-11 ENCOUNTER — Other Ambulatory Visit: Payer: Self-pay

## 2018-07-11 DIAGNOSIS — R269 Unspecified abnormalities of gait and mobility: Secondary | ICD-10-CM | POA: Diagnosis not present

## 2018-07-11 DIAGNOSIS — Z79899 Other long term (current) drug therapy: Secondary | ICD-10-CM | POA: Diagnosis not present

## 2018-07-11 DIAGNOSIS — R569 Unspecified convulsions: Secondary | ICD-10-CM

## 2018-07-11 DIAGNOSIS — G3184 Mild cognitive impairment, so stated: Secondary | ICD-10-CM

## 2018-07-11 MED ORDER — MEMANTINE HCL 10 MG PO TABS: 10.0000 mg | ORAL_TABLET | Freq: Two times a day (BID) | ORAL | 4 refills | Status: DC

## 2018-07-11 MED ORDER — LAMOTRIGINE 200 MG PO TABS: 200.0000 mg | ORAL_TABLET | Freq: Two times a day (BID) | ORAL | 4 refills | Status: DC

## 2018-07-11 NOTE — Progress Notes (Signed)
[facility]  PATIENT: Sheri Valencia DOB: 07-26-1945   HISTORY OF PRESENT ILLNESS:Sheri Valencia is a 73 years old right-handed Caucasian female,followed for complex partial seizure with secondary generalization. She has a history of complex partial seizure with secondary generalization since age 54, presented with rising sensation, then passed out followed by generalized motor seizure, last seizure was in 2002, she was treated with Lamictal 200 mg twice a day, continue to have recurrent seizure, Keppra 500 mg 2 tablets twice a day was added on, no recurrent seizure since. 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 Other levels alsodemonstrate significant stenosis.. She denies significant neck pain She has mild unsteady gait, always contributed to her left hip problem,  She was evaluated recently  by Dr. Channing Mutters, neurosurgeon, she follows up yearly   She returns for reevaluation and refills. She is on Aricept by PCP Dr. [name].   UPDATE Jul 05 2017:  I was able to review the letter from her  Sister Sheri Valencia, she is accompanied by her friend Sheri Valencia at today's clinical visit,  "From her sister's letter: Tayde continue has memory problem, easily gets confused, gets frustrated easily, but deny her problem, there was also description of paranoid, emotional incontinence, forgetful, poor decision-making process for her health, irrational spell, recent purchase of a new car, but difficult to operating her new  vehicle, impulsive action, got lost while driving, difficulty working with her computer, loose piece constantly, difficulty managing her bills, ongoing since 2017, progressively getting worse"  Sheri Valencia drove her here today, she has lived [place] for 50 years, 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   She likes to walk, reading.  She lives in a codominum.   She has not have seizure for few years. 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  UPDATE October 26 2017: She was able to taper off Keppra 500 mg twice daily, only on lamotrigine 200 mg twice a day, doing better, along with Namenda 10 mg twice a day, her memory seems to improve will stabilize, care of her elderly aunt,  We personally reviewed MRI of the brain in May 2019, mild generalized atrophy no acute abnormality,  EEG was normal   Laboratory evaluation in May 2019 showed normal TSH, B12, RPR, CBC, CMP showed mild elevated 1.25, with GFR of 44, Keppra level was 66, lamotrigine level was 22, she has no signs of toxicity, the level was not trough level  Virtual Visit via Video  I connected with Sheri Valencia on 07/11/18 at  by Video and  verified that I am speaking with the correct person using two identifiers.   I discussed the limitations, risks, security and privacy  concerns of performing an evaluation and management service by video and the availability of in person appointments. I also discussed with the patient that there may be a patient responsible charge related to this service. The patient expressed understanding and agreed to proceed.   History of Present Illness: She is with her sister Sheri Valencia at visit.  She had no recurrent seizure, taking lamotrigine 200 mg twice a day, Keppra 500 mg twice a day, he complains of increased confusion, sometimes unsteady gait, fell few times, sister also concerned about worsening memory loss, had few incident of Ms. handling her debit card,  Previously we have tried to taper off Keppra 500 mg 2 tablets twice a day, because her underweight, has not had recurrent seizure for many years, but she is concerned about potential recurrent seizure, put herself back on Keppra 1 tablets twice a day,   Laboratory evaluations on Jul 05, 2017, normal TSH 1.9, lamotrigine 22.7, Keppra 66.5, normal B12, CBC hemoglobin of 11.1, creatinine of 1.25, potassium of 5.4  Observations/Objective: I have reviewed problem lists, medications, allergies. Awake alert oriented to history taking and casual conversation,  steady gait  Assessment and Plan: Complex partial seizure with secondary generalization, last reported seizure was in 2002, Polypharmacy treatment, Depression  Keep lamotrigine 200mg  bid,   Taper off Keppra 500 mg twice a day  Keep Namenda 10 mg twice a day  Check lamotrigine level,  Follow Up Instructions:  6 months    I discussed the assessment and treatment plan with the patient. The patient was provided an opportunity to ask questions and all were answered. The patient agreed with the plan and demonstrated an understanding of the instructions.   The patient was advised to call back or seek an in-person evaluation if the symptoms worsen or if the condition fails to improve as anticipated.  I provided 30 minutes of  non-face-to-face time during this encounter.   Levert Feinstein, MD

## 2018-07-12 ENCOUNTER — Other Ambulatory Visit (INDEPENDENT_AMBULATORY_CARE_PROVIDER_SITE_OTHER): Payer: Self-pay

## 2018-07-12 ENCOUNTER — Other Ambulatory Visit: Payer: Self-pay

## 2018-07-12 DIAGNOSIS — G3184 Mild cognitive impairment, so stated: Secondary | ICD-10-CM

## 2018-07-12 DIAGNOSIS — R269 Unspecified abnormalities of gait and mobility: Secondary | ICD-10-CM | POA: Diagnosis not present

## 2018-07-12 DIAGNOSIS — Z79899 Other long term (current) drug therapy: Secondary | ICD-10-CM | POA: Diagnosis not present

## 2018-07-12 DIAGNOSIS — R569 Unspecified convulsions: Secondary | ICD-10-CM | POA: Diagnosis not present

## 2018-07-12 DIAGNOSIS — Z0289 Encounter for other administrative examinations: Secondary | ICD-10-CM

## 2018-07-13 LAB — COMPREHENSIVE METABOLIC PANEL
ALT: 14 IU/L (ref 0–32)
AST: 22 IU/L (ref 0–40)
Albumin/Globulin Ratio: 1.5 (ref 1.2–2.2)
Albumin: 4 g/dL (ref 3.8–4.8)
Alkaline Phosphatase: 128 IU/L — ABNORMAL HIGH (ref 39–117)
BUN/Creatinine Ratio: 12 (ref 12–28)
BUN: 14 mg/dL (ref 8–27)
Bilirubin Total: <0.2 mg/dL (ref 0.0–1.2)
CO2: 23 mmol/L (ref 20–29)
Calcium: 8.6 mg/dL — ABNORMAL LOW (ref 8.7–10.3)
Chloride: 100 mmol/L (ref 96–106)
Creatinine, Ser: 1.18 mg/dL — ABNORMAL HIGH (ref 0.57–1.00)
GFR calc Af Amer: 54 mL/min/{1.73_m2} — ABNORMAL LOW (ref >59)
GFR calc non Af Amer: 47 mL/min/{1.73_m2} — ABNORMAL LOW (ref >59)
Globulin, Total: 2.7 g/dL (ref 1.5–4.5)
Glucose: 75 mg/dL (ref 65–99)
Potassium: 4.5 mmol/L (ref 3.5–5.2)
Sodium: 139 mmol/L (ref 134–144)
Total Protein: 6.7 g/dL (ref 6.0–8.5)

## 2018-07-13 LAB — CBC WITH DIFFERENTIAL/PLATELET
Basophils Absolute: 0.1 10*3/uL (ref 0.0–0.2)
Basos: 1 %
EOS (ABSOLUTE): 0.2 10*3/uL (ref 0.0–0.4)
Eos: 2 %
Hematocrit: 34.3 % (ref 34.0–46.6)
Hemoglobin: 11.1 g/dL (ref 11.1–15.9)
Immature Grans (Abs): 0.1 10*3/uL (ref 0.0–0.1)
Immature Granulocytes: 1 %
Lymphocytes Absolute: 1.6 10*3/uL (ref 0.7–3.1)
Lymphs: 14 %
MCH: 27.8 pg (ref 26.6–33.0)
MCHC: 32.4 g/dL (ref 31.5–35.7)
MCV: 86 fL (ref 79–97)
Monocytes Absolute: 0.7 10*3/uL (ref 0.1–0.9)
Monocytes: 7 %
Neutrophils Absolute: 8.2 10*3/uL — ABNORMAL HIGH (ref 1.4–7.0)
Neutrophils: 75 %
Platelets: 317 10*3/uL (ref 150–450)
RBC: 3.99 x10E6/uL (ref 3.77–5.28)
RDW: 13 % (ref 11.7–15.4)
WBC: 10.8 10*3/uL (ref 3.4–10.8)

## 2018-07-13 LAB — LAMOTRIGINE LEVEL: Lamotrigine Lvl: 17.1 ug/mL (ref 2.0–20.0)

## 2018-07-16 ENCOUNTER — Telehealth: Payer: Self-pay | Admitting: Neurology

## 2018-07-16 NOTE — Telephone Encounter (Signed)
Please call patient, laboratory evaluation showed normal cbc, hg 11, lamotrigine level 17, high normal range,  cmp showed creat 1.18,   Continue treatment plan as discussed during virtual visit on May 6th

## 2018-07-17 NOTE — Telephone Encounter (Addendum)
Spoke to the patient.  She was notified of the lab results below and verbalized understanding to continue the treatment plan discussed at her last visit.

## 2018-07-19 DIAGNOSIS — I10 Essential (primary) hypertension: Secondary | ICD-10-CM | POA: Diagnosis not present

## 2018-07-19 DIAGNOSIS — F329 Major depressive disorder, single episode, unspecified: Secondary | ICD-10-CM | POA: Diagnosis not present

## 2018-07-19 DIAGNOSIS — E7800 Pure hypercholesterolemia, unspecified: Secondary | ICD-10-CM | POA: Diagnosis not present

## 2018-07-19 DIAGNOSIS — F0390 Unspecified dementia without behavioral disturbance: Secondary | ICD-10-CM | POA: Diagnosis not present

## 2018-07-25 ENCOUNTER — Telehealth: Payer: Self-pay | Admitting: Neurology

## 2018-07-25 MED ORDER — LEVETIRACETAM 500 MG PO TABS: 500.0000 mg | ORAL_TABLET | Freq: Two times a day (BID) | ORAL | 11 refills | Status: DC

## 2018-07-25 NOTE — Addendum Note (Signed)
Addended by: Lindell Spar C on: 07/25/2018 05:01 PM   Modules accepted: Orders

## 2018-07-25 NOTE — Telephone Encounter (Signed)
Pt is asking for a call to discuss possibly getting back on Keppra.  Pt states she feels like she may have had seizures, possibly in her sleep.  Please call

## 2018-07-25 NOTE — Telephone Encounter (Signed)
Attempted to reach patient.  No answer or machine.

## 2018-07-25 NOTE — Telephone Encounter (Signed)
Spoke to patient.  She reported 2-3 events of waking up to urination in bed, increased sweating and fatigue since stopping Keppra.  Per vo by Dr. Terrace Arabia, restart Keppra 500mg , one tablet BID.  The patient is agreeable to this plan.  She will call our office to inform of [place] of any other seizure-like episodes.

## 2018-08-27 ENCOUNTER — Telehealth: Payer: Self-pay | Admitting: Neurology

## 2018-08-27 NOTE — Telephone Encounter (Signed)
Pt called wanting to know which is the medication that she was taken off of on her last visit. Please advise.

## 2018-08-27 NOTE — Telephone Encounter (Signed)
I called the patient back.  She was going to the pharmacy and wanted to confirm that she should not request a refill for Keppra.  She was tapered off at her last visit and is no longer on it.  She verbalized understanding to no longer take this medication.

## 2018-08-29 DIAGNOSIS — I10 Essential (primary) hypertension: Secondary | ICD-10-CM | POA: Diagnosis not present

## 2018-08-29 DIAGNOSIS — F329 Major depressive disorder, single episode, unspecified: Secondary | ICD-10-CM | POA: Diagnosis not present

## 2018-08-29 DIAGNOSIS — F0390 Unspecified dementia without behavioral disturbance: Secondary | ICD-10-CM | POA: Diagnosis not present

## 2018-08-29 DIAGNOSIS — E7800 Pure hypercholesterolemia, unspecified: Secondary | ICD-10-CM | POA: Diagnosis not present

## 2018-09-21 DIAGNOSIS — E7800 Pure hypercholesterolemia, unspecified: Secondary | ICD-10-CM | POA: Diagnosis not present

## 2018-09-21 DIAGNOSIS — F0390 Unspecified dementia without behavioral disturbance: Secondary | ICD-10-CM | POA: Diagnosis not present

## 2018-09-21 DIAGNOSIS — I10 Essential (primary) hypertension: Secondary | ICD-10-CM | POA: Diagnosis not present

## 2018-09-21 DIAGNOSIS — F329 Major depressive disorder, single episode, unspecified: Secondary | ICD-10-CM | POA: Diagnosis not present

## 2018-10-22 DIAGNOSIS — I10 Essential (primary) hypertension: Secondary | ICD-10-CM | POA: Diagnosis not present

## 2018-10-22 DIAGNOSIS — F0390 Unspecified dementia without behavioral disturbance: Secondary | ICD-10-CM | POA: Diagnosis not present

## 2018-10-22 DIAGNOSIS — E7800 Pure hypercholesterolemia, unspecified: Secondary | ICD-10-CM | POA: Diagnosis not present

## 2018-10-22 DIAGNOSIS — F329 Major depressive disorder, single episode, unspecified: Secondary | ICD-10-CM | POA: Diagnosis not present

## 2018-10-31 ENCOUNTER — Encounter: Payer: Self-pay | Admitting: Neurology

## 2018-10-31 ENCOUNTER — Ambulatory Visit (INDEPENDENT_AMBULATORY_CARE_PROVIDER_SITE_OTHER): Payer: PPO | Admitting: Neurology

## 2018-10-31 ENCOUNTER — Other Ambulatory Visit: Payer: Self-pay

## 2018-10-31 VITALS — BP 160/71 | HR 76 | Temp 98.0°F | Ht 61.0 in | Wt 99.5 lb

## 2018-10-31 DIAGNOSIS — R569 Unspecified convulsions: Secondary | ICD-10-CM | POA: Diagnosis not present

## 2018-10-31 DIAGNOSIS — G3184 Mild cognitive impairment, so stated: Secondary | ICD-10-CM

## 2018-10-31 MED ORDER — LAMOTRIGINE ER 200 MG PO TB24: 400.0000 mg | ORAL_TABLET | Freq: Every day | ORAL | 4 refills | Status: DC

## 2018-10-31 NOTE — Progress Notes (Signed)
PATIENT: Sheri Valencia DOB: 1945/03/31  Chief Complaint  Patient presents with  . Seizures    She is here, with her sister Sheri Valencia, for her yearly follow up. No seizure activity reported.  Feels she is doing well on Lamictal 200mg  BID.  . Memory Loss    MMSE 29/30 - 6 animals.  See feels her memory is stable but her sister feels there has been further decline. She is on Namenda 10mg  BID.     HISTORICAL Sheri Valencia is a 73 years old right-handed Caucasian female,followed for complex partial seizure with secondary generalization. 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 Other levels alsodemonstrate significant stenosis.. She denies significant neck pain She has mild unsteady gait, always contributed to her left hip problem,  She was evaluated recently  by Dr. Channing Mutters, neurosurgeon, she follows up yearly   She returns for reevaluation and refills. She is on Aricept by PCP Dr. [name].   UPDATE Jul 05 2017:  I was able to review the letter from her  Sister Sheri Valencia, she is accompanied by her friend Sheri Valencia at today's clinical visit,  "[name] continue has memory problem, easily gets confused, gets frustrated easily, but deny her problem, there was also description of paranoid, emotional incontinence, forgetful, poor decision-making process for her health, irrational spell, recent purchase of a new car, but difficult to operating her new vehicle, impulsive action, got lost while driving, difficulty working with her computer, loose piece constantly, difficulty managing her bills, ongoing since 2017, progressively getting worse"  Sheri Valencia drove her here today, she has lived [place] for 50 years, 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   She likes to walk, reading.  She lives in a codominum.   She has not have seizure for few years. 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  UPDATE October 26 2017: She was able to taper off Keppra 500 mg twice daily, only on lamotrigine 200 mg twice a day, doing better, along with Namenda 10 mg twice a day, her memory seems to improve will stabilize, care of her elderly aunt,  We personally reviewed MRI of the brain in May 2019, mild  generalized atrophy no acute abnormality,  EEG was normal   Laboratory evaluation in May 2019 showed normal TSH, B12, RPR, CBC, CMP showed mild elevated 1.25, with GFR of 44, Keppra level was 66,  lamotrigine level was 22, she has no signs of toxicity, the level was not trough level  Virtual Visit via Video on Jul 11, 2018  She is with her sister Sheri Valencia at visit.  She had no recurrent seizure, taking lamotrigine 200 mg twice a day, Keppra 500 mg twice a day, he complains of increased confusion, sometimes unsteady gait, fell few times, sister also concerned about worsening memory loss, had few incident of mis- handling her debit card,  Previously we have tried to taper off Keppra 500 mg 2 tablets twice a day, because her underweight, has not had recurrent seizure for many years, but she is concerned about potential recurrent seizure, put herself back on Keppra 1 tablets twice a day,   Laboratory evaluations on Jul 05, 2017, normal TSH 1.9, lamotrigine 22.7, Keppra 66.5, normal B12, CBC hemoglobin of 11.1, creatinine of 1.25, potassium of 5.4  Update October 31, 2018: She is with her sister Sheri Valencia at today's visit, she continue to take lamotrigine 200 mg twice a day, has no recurrent seizure, she has worsening memory loss, excessive daytime sleepiness, fatigue, continue have depression, taking Zoloft 100 mg daily, she complains of change of taste, has decreased appetite, with some weight loss,  Laboratory evaluations on Jul 12, 2018 showed lamotrigine level was 17,   REVIEW OF SYSTEMS: Full 14 system review of systems performed and notable only for as above All other review of systems were negative.  ALLERGIES: Allergies  Allergen Reactions  . Ativan [Lorazepam]     confusion    HOME MEDICATIONS: Current Outpatient Medications  Medication Sig Dispense Refill  . aspirin 81 MG tablet Take 81 mg by mouth daily.    . Calcium Carbonate-Vit D-Min (CALTRATE PLUS PO) Take by mouth daily.    Marland Kitchen lisinopril (PRINIVIL,ZESTRIL) 20 MG tablet Take 20 mg by mouth daily.    . memantine (NAMENDA) 10 MG tablet Take 1 tablet (10 mg total) by mouth 2 (two) times daily. 180 tablet 4  . Multiple Vitamin  (VITAMIN E/FOLIC ACID/B-6/B-12) CAPS Take by mouth daily.    . propranolol (INDERAL) 20 MG tablet 20 mg daily.    . sertraline (ZOLOFT) 100 MG tablet Take 100 mg by mouth daily.    Marland Kitchen VITAMIN D, ERGOCALCIFEROL, PO Take by mouth daily.    . LamoTRIgine 200 MG TB24 24 hour tablet Take 2 tablets (400 mg total) by mouth at bedtime. 180 tablet 4   No current facility-administered medications for this visit.     PAST MEDICAL HISTORY: Past Medical History:  Diagnosis Date  . Memory loss   . Seizure (HCC)     PAST SURGICAL HISTORY: Past Surgical History:  Procedure Laterality Date  . APPENDECTOMY    . TUBAL LIGATION      FAMILY HISTORY: Family History  Problem Relation Age of Onset  . Pneumonia Mother   . Heart attack Maternal Grandmother   . Stroke Maternal Grandmother   . Suicidality Brother   . Aneurysm Maternal Aunt   . Breast cancer Sister        04/2015  Stage I    SOCIAL HISTORY: Social History   Socioeconomic History  . Marital status: Widowed    Spouse name: Not on file  . Number of children: 0  . Years of  education: college  . Highest education level: Not on file  Occupational History    Employer: RETIRED    Comment: retired  Engineer, production  . Financial resource strain: Not on file  . Food insecurity    Worry: Not on file    Inability: Not on file  . Transportation needs    Medical: Not on file    Non-medical: Not on file  Tobacco Use  . Smoking status: Never Smoker  . Smokeless tobacco: Never Used  Substance and Sexual Activity  . Alcohol use: Yes    Alcohol/week: 1.0 standard drinks    Types: 1 Glasses of wine per week  . Drug use: No  . Sexual activity: Not on file  Lifestyle  . Physical activity    Days per week: Not on file    Minutes per session: Not on file  . Stress: Not on file  Relationships  . Social Musician on phone: Not on file    Gets together: Not on file    Attends religious service: Not on file    Active member of  club or organization: Not on file    Attends meetings of clubs or organizations: Not on file    Relationship status: Not on file  . Intimate partner violence    Fear of current or ex partner: Not on file    Emotionally abused: Not on file    Physically abused: Not on file    Forced sexual activity: Not on file  Other Topics Concern  . Not on file  Social History Narrative   Patient lives at home alone and she is widowed. Retired.   College education    Caffeine one cup daily.           PHYSICAL EXAM   Vitals:   10/31/18 1052  BP: (!) 160/71  Pulse: 76  Temp: 98 F (36.7 C)  Weight: 99 lb 8 oz (45.1 kg)  Height: 5\' 1"  (1.549 m)    Not recorded      Body mass index is 18.8 kg/m.  PHYSICAL EXAMNIATION:  Gen: NAD, conversant, well nourised, obese, well groomed                     Cardiovascular: Regular rate rhythm, no peripheral edema, warm, nontender. Eyes: Conjunctivae clear without exudates or hemorrhage Neck: Supple, no carotid bruits. Pulmonary: Clear to auscultation bilaterally   NEUROLOGICAL EXAM:  MMSE - Mini Mental State Exam 10/31/2018 07/05/2017  Orientation to time 5 5  Orientation to Place 5 5  Registration 3 3  Attention/ Calculation 5 5  Recall 2 3  Language- name 2 objects 2 2  Language- repeat 1 1  Language- follow 3 step command 3 3  Language- read & follow direction 1 1  Write a sentence 1 1  Copy design 1 1  Total score 29 30  animal naming 6   CRANIAL NERVES: CN II: Visual fields are full to confrontation.   Pupils are round equal and briskly reactive to light. CN III, IV, VI: extraocular movement are normal. No ptosis. CN V: Facial sensation is intact to pinprick in all 3 divisions bilaterally. Corneal responses are intact.  CN VII: Face is symmetric with normal eye closure and smile. CN VIII: Hearing is normal to rubbing fingers CN IX, X: Palate elevates symmetrically. Phonation is normal. CN XI: Head turning and shoulder shrug are  intact CN XII: Tongue is midline with normal movements  and no atrophy.  MOTOR: There is no pronator drift of out-stretched arms. Muscle bulk and tone are normal. Muscle strength is normal.  REFLEXES: Reflexes are 2+ and symmetric at the biceps, triceps, knees, and ankles. Plantar responses are flexor.  SENSORY: Intact to light touch, pinprick, positional sensation and vibratory sensation are intact in fingers and toes.  COORDINATION: Rapid alternating movements and fine finger movements are intact. There is no dysmetria on finger-to-nose and heel-knee-shin.    GAIT/STANCE: She needs to push up to get up from seated position, mildly unsteady   DIAGNOSTIC DATA (LABS, IMAGING, TESTING) - I reviewed patient records, labs, notes, testing and imaging myself where available.   ASSESSMENT AND PLAN  Nelta Dunsford Ditullio is a 73 y.o. female   Complex partial seizure with secondary generalization, last reported seizure was in 2002, Polypharmacy treatment, Depression Mild cognitive impairment  Change lamotrigine to ER 200 mg 2 tablets every night,  She was able to successfully taper off Keppra without recurrent seizure  Continue Zoloft 100 mg daily to 10 mg twice a day     Levert Feinstein, M.D. Ph.D.  [facility] [address] [place], [place] [address] Ph: [phone] Fax: [phone]  CC: Referring Provider

## 2018-11-22 DIAGNOSIS — Z96642 Presence of left artificial hip joint: Secondary | ICD-10-CM | POA: Diagnosis not present

## 2018-11-22 DIAGNOSIS — Z6821 Body mass index (BMI) 21.0-21.9, adult: Secondary | ICD-10-CM | POA: Diagnosis not present

## 2018-11-22 DIAGNOSIS — M533 Sacrococcygeal disorders, not elsewhere classified: Secondary | ICD-10-CM | POA: Diagnosis not present

## 2018-11-22 DIAGNOSIS — Z299 Encounter for prophylactic measures, unspecified: Secondary | ICD-10-CM | POA: Diagnosis not present

## 2018-11-22 DIAGNOSIS — Z471 Aftercare following joint replacement surgery: Secondary | ICD-10-CM | POA: Diagnosis not present

## 2018-11-22 DIAGNOSIS — M25552 Pain in left hip: Secondary | ICD-10-CM | POA: Diagnosis not present

## 2018-11-22 DIAGNOSIS — M1612 Unilateral primary osteoarthritis, left hip: Secondary | ICD-10-CM | POA: Diagnosis not present

## 2018-11-22 DIAGNOSIS — I10 Essential (primary) hypertension: Secondary | ICD-10-CM | POA: Diagnosis not present

## 2018-11-26 ENCOUNTER — Other Ambulatory Visit: Payer: Self-pay | Admitting: Neurology

## 2018-11-27 DIAGNOSIS — F0390 Unspecified dementia without behavioral disturbance: Secondary | ICD-10-CM | POA: Diagnosis not present

## 2018-11-27 DIAGNOSIS — F329 Major depressive disorder, single episode, unspecified: Secondary | ICD-10-CM | POA: Diagnosis not present

## 2018-11-27 DIAGNOSIS — E7800 Pure hypercholesterolemia, unspecified: Secondary | ICD-10-CM | POA: Diagnosis not present

## 2018-11-27 DIAGNOSIS — I10 Essential (primary) hypertension: Secondary | ICD-10-CM | POA: Diagnosis not present

## 2018-11-29 ENCOUNTER — Telehealth: Payer: Self-pay | Admitting: Neurology

## 2018-11-29 ENCOUNTER — Other Ambulatory Visit: Payer: Self-pay | Admitting: Neurology

## 2018-11-29 MED ORDER — MEMANTINE HCL 10 MG PO TABS: 10.0000 mg | ORAL_TABLET | Freq: Two times a day (BID) | ORAL | 3 refills | Status: DC

## 2018-11-29 NOTE — Telephone Encounter (Signed)
Pt is needing a refill on her memantine (NAMENDA) 10 MG tablet sent to [facility]

## 2018-11-29 NOTE — Telephone Encounter (Signed)
The prescription was sent in for 90-day x 4 refills on 07/11/2018.  I called [name] to confirm and they could not find the refills.  The prescription has been resent.  I also called the patient and she is aware they are getting it ready for her.

## 2018-12-25 DIAGNOSIS — I10 Essential (primary) hypertension: Secondary | ICD-10-CM | POA: Diagnosis not present

## 2018-12-25 DIAGNOSIS — F0390 Unspecified dementia without behavioral disturbance: Secondary | ICD-10-CM | POA: Diagnosis not present

## 2018-12-25 DIAGNOSIS — E7800 Pure hypercholesterolemia, unspecified: Secondary | ICD-10-CM | POA: Diagnosis not present

## 2018-12-25 DIAGNOSIS — F329 Major depressive disorder, single episode, unspecified: Secondary | ICD-10-CM | POA: Diagnosis not present

## 2019-01-11 ENCOUNTER — Telehealth: Payer: Self-pay | Admitting: Neurology

## 2019-01-11 NOTE — Telephone Encounter (Signed)
Pt sister(on DPR) is asking for a call to report that 3 times in a month pt has fallen.  Pt sister also reports that pt told her that pt told her she was told she has dementia, sister wants to discuss details of the diagnosis

## 2019-01-14 NOTE — Telephone Encounter (Signed)
I spoke to her sister on [place].  She is worried about her sister having several falls (no injuries).  She does not seem off balance but has had three events in the last month (tripped over box, foot asleep, lost footing).  She is going to be seeing her PCP for check-up.  No seizures.  Her sister also wanted to know how to help with memory loss.  Reviewed cognitive games, importance of socializing, keeping close eye on medications, etc.  She is concerned about the patient's money not being safe and plans to discuss POA for later purposes.  I informed her it is best to make legal decisions when of sound mind so planning can be done together.  She verbalized understanding.

## 2019-01-24 DIAGNOSIS — F0390 Unspecified dementia without behavioral disturbance: Secondary | ICD-10-CM | POA: Diagnosis not present

## 2019-01-24 DIAGNOSIS — F329 Major depressive disorder, single episode, unspecified: Secondary | ICD-10-CM | POA: Diagnosis not present

## 2019-01-24 DIAGNOSIS — E7800 Pure hypercholesterolemia, unspecified: Secondary | ICD-10-CM | POA: Diagnosis not present

## 2019-01-24 DIAGNOSIS — I10 Essential (primary) hypertension: Secondary | ICD-10-CM | POA: Diagnosis not present

## 2019-02-01 DIAGNOSIS — S52591A Other fractures of lower end of right radius, initial encounter for closed fracture: Secondary | ICD-10-CM | POA: Diagnosis not present

## 2019-02-01 DIAGNOSIS — Z1339 Encounter for screening examination for other mental health and behavioral disorders: Secondary | ICD-10-CM | POA: Diagnosis not present

## 2019-02-01 DIAGNOSIS — R5383 Other fatigue: Secondary | ICD-10-CM | POA: Diagnosis not present

## 2019-02-01 DIAGNOSIS — G309 Alzheimer's disease, unspecified: Secondary | ICD-10-CM | POA: Diagnosis not present

## 2019-02-01 DIAGNOSIS — Z0000 Encounter for general adult medical examination without abnormal findings: Secondary | ICD-10-CM | POA: Diagnosis not present

## 2019-02-01 DIAGNOSIS — S52509A Unspecified fracture of the lower end of unspecified radius, initial encounter for closed fracture: Secondary | ICD-10-CM | POA: Diagnosis not present

## 2019-02-01 DIAGNOSIS — Z79899 Other long term (current) drug therapy: Secondary | ICD-10-CM | POA: Diagnosis not present

## 2019-02-01 DIAGNOSIS — Z7189 Other specified counseling: Secondary | ICD-10-CM | POA: Diagnosis not present

## 2019-02-01 DIAGNOSIS — M25531 Pain in right wrist: Secondary | ICD-10-CM | POA: Diagnosis not present

## 2019-02-01 DIAGNOSIS — M25431 Effusion, right wrist: Secondary | ICD-10-CM | POA: Diagnosis not present

## 2019-02-01 DIAGNOSIS — Z1331 Encounter for screening for depression: Secondary | ICD-10-CM | POA: Diagnosis not present

## 2019-02-01 DIAGNOSIS — Z299 Encounter for prophylactic measures, unspecified: Secondary | ICD-10-CM | POA: Diagnosis not present

## 2019-02-01 DIAGNOSIS — E559 Vitamin D deficiency, unspecified: Secondary | ICD-10-CM | POA: Diagnosis not present

## 2019-02-01 DIAGNOSIS — E7800 Pure hypercholesterolemia, unspecified: Secondary | ICD-10-CM | POA: Diagnosis not present

## 2019-02-04 DIAGNOSIS — S52591D Other fractures of lower end of right radius, subsequent encounter for closed fracture with routine healing: Secondary | ICD-10-CM | POA: Diagnosis not present

## 2019-02-05 ENCOUNTER — Telehealth: Payer: Self-pay | Admitting: Neurology

## 2019-02-05 NOTE — Telephone Encounter (Signed)
Pt's sister Drenda Freeze on [place] called wanting to know if she can get an updated Medication list on the medications provider has given her. [name] states that the pt has been given more medications and they are needing to make sure the list is updated. Please advise.

## 2019-02-05 NOTE — Telephone Encounter (Signed)
I returned the call to her sister on [place].  She is calling all the patient's physicians to make sure she understands her medication regimen.  She is going to manage them for now on.

## 2019-02-26 DIAGNOSIS — R05 Cough: Secondary | ICD-10-CM | POA: Diagnosis not present

## 2019-02-26 DIAGNOSIS — J029 Acute pharyngitis, unspecified: Secondary | ICD-10-CM | POA: Diagnosis not present

## 2019-03-03 ENCOUNTER — Emergency Department (HOSPITAL_COMMUNITY)
Admission: EM | Admit: 2019-03-03 | Discharge: 2019-03-03 | Disposition: A | Discharge status: Home / Self Care | Payer: PPO | Attending: Emergency Medicine | Admitting: Emergency Medicine

## 2019-03-03 ENCOUNTER — Encounter (HOSPITAL_COMMUNITY): Payer: Self-pay | Admitting: Emergency Medicine

## 2019-03-03 ENCOUNTER — Emergency Department (HOSPITAL_COMMUNITY): Payer: PPO

## 2019-03-03 ENCOUNTER — Other Ambulatory Visit: Payer: Self-pay

## 2019-03-03 DIAGNOSIS — I10 Essential (primary) hypertension: Secondary | ICD-10-CM | POA: Diagnosis not present

## 2019-03-03 DIAGNOSIS — R404 Transient alteration of awareness: Secondary | ICD-10-CM | POA: Diagnosis not present

## 2019-03-03 DIAGNOSIS — R569 Unspecified convulsions: Secondary | ICD-10-CM | POA: Insufficient documentation

## 2019-03-03 DIAGNOSIS — R4182 Altered mental status, unspecified: Secondary | ICD-10-CM | POA: Diagnosis not present

## 2019-03-03 DIAGNOSIS — R410 Disorientation, unspecified: Secondary | ICD-10-CM | POA: Diagnosis not present

## 2019-03-03 DIAGNOSIS — Z87898 Personal history of other specified conditions: Secondary | ICD-10-CM

## 2019-03-03 DIAGNOSIS — Z79899 Other long term (current) drug therapy: Secondary | ICD-10-CM | POA: Diagnosis not present

## 2019-03-03 DIAGNOSIS — Z20828 Contact with and (suspected) exposure to other viral communicable diseases: Secondary | ICD-10-CM | POA: Insufficient documentation

## 2019-03-03 DIAGNOSIS — W19XXXA Unspecified fall, initial encounter: Secondary | ICD-10-CM | POA: Diagnosis not present

## 2019-03-03 DIAGNOSIS — R519 Headache, unspecified: Secondary | ICD-10-CM | POA: Diagnosis not present

## 2019-03-03 DIAGNOSIS — Z7982 Long term (current) use of aspirin: Secondary | ICD-10-CM | POA: Insufficient documentation

## 2019-03-03 DIAGNOSIS — R110 Nausea: Secondary | ICD-10-CM | POA: Diagnosis not present

## 2019-03-03 DIAGNOSIS — F0390 Unspecified dementia without behavioral disturbance: Secondary | ICD-10-CM | POA: Diagnosis not present

## 2019-03-03 DIAGNOSIS — G40909 Epilepsy, unspecified, not intractable, without status epilepticus: Secondary | ICD-10-CM | POA: Diagnosis not present

## 2019-03-03 LAB — COMPREHENSIVE METABOLIC PANEL
ALT: 19 U/L (ref 0–44)
AST: 27 U/L (ref 15–41)
Albumin: 3.4 g/dL — ABNORMAL LOW (ref 3.5–5.0)
Alkaline Phosphatase: 92 U/L (ref 38–126)
Anion gap: 10 (ref 5–15)
BUN: 9 mg/dL (ref 8–23)
CO2: 25 mmol/L (ref 22–32)
Calcium: 8.8 mg/dL — ABNORMAL LOW (ref 8.9–10.3)
Chloride: 102 mmol/L (ref 98–111)
Creatinine, Ser: 0.88 mg/dL (ref 0.44–1.00)
GFR calc Af Amer: >60 mL/min (ref >60)
GFR calc non Af Amer: >60 mL/min (ref >60)
Glucose, Bld: 122 mg/dL — ABNORMAL HIGH (ref 70–99)
Potassium: 3.4 mmol/L — ABNORMAL LOW (ref 3.5–5.1)
Sodium: 137 mmol/L (ref 135–145)
Total Bilirubin: 0.3 mg/dL (ref 0.3–1.2)
Total Protein: 6.7 g/dL (ref 6.5–8.1)

## 2019-03-03 LAB — URINALYSIS, ROUTINE W REFLEX MICROSCOPIC
Bilirubin Urine: NEGATIVE
Glucose, UA: NEGATIVE mg/dL
Ketones, ur: NEGATIVE mg/dL
Nitrite: NEGATIVE
Protein, ur: NEGATIVE mg/dL
Specific Gravity, Urine: 1.005 (ref 1.005–1.030)
pH: 6 (ref 5.0–8.0)

## 2019-03-03 LAB — CBC WITH DIFFERENTIAL/PLATELET
Abs Immature Granulocytes: 0.05 10*3/uL (ref 0.00–0.07)
Basophils Absolute: 0 10*3/uL (ref 0.0–0.1)
Basophils Relative: 0 %
Eosinophils Absolute: 0.1 10*3/uL (ref 0.0–0.5)
Eosinophils Relative: 1 %
HCT: 35.5 % — ABNORMAL LOW (ref 36.0–46.0)
Hemoglobin: 11.3 g/dL — ABNORMAL LOW (ref 12.0–15.0)
Immature Granulocytes: 0 %
Lymphocytes Relative: 7 %
Lymphs Abs: 0.9 10*3/uL (ref 0.7–4.0)
MCH: 28.3 pg (ref 26.0–34.0)
MCHC: 31.8 g/dL (ref 30.0–36.0)
MCV: 88.8 fL (ref 80.0–100.0)
Monocytes Absolute: 0.6 10*3/uL (ref 0.1–1.0)
Monocytes Relative: 5 %
Neutro Abs: 10.8 10*3/uL — ABNORMAL HIGH (ref 1.7–7.7)
Neutrophils Relative %: 87 %
Platelets: 356 10*3/uL (ref 150–400)
RBC: 4 MIL/uL (ref 3.87–5.11)
RDW: 12.9 % (ref 11.5–15.5)
WBC: 12.4 10*3/uL — ABNORMAL HIGH (ref 4.0–10.5)
nRBC: 0 % (ref 0.0–0.2)

## 2019-03-03 LAB — RAPID URINE DRUG SCREEN, HOSP PERFORMED
Amphetamines: NOT DETECTED
Barbiturates: NOT DETECTED
Benzodiazepines: NOT DETECTED
Cocaine: NOT DETECTED
Opiates: NOT DETECTED
Tetrahydrocannabinol: NOT DETECTED

## 2019-03-03 LAB — ETHANOL: Alcohol, Ethyl (B): <10 mg/dL (ref <10)

## 2019-03-03 LAB — AMMONIA: Ammonia: 11 umol/L (ref 9–35)

## 2019-03-03 MED ORDER — ONDANSETRON HCL 4 MG PO TABS: 4.0000 mg | ORAL_TABLET | Freq: Three times a day (TID) | ORAL | 0 refills | Status: DC | PRN

## 2019-03-03 MED ORDER — ONDANSETRON 4 MG PO TBDP
4.0000 mg | ORAL_TABLET | Freq: Once | ORAL | Status: AC
  Administered 2019-03-03: 4 mg via ORAL
  Filled 2019-03-03: qty 1

## 2019-03-03 NOTE — ED Notes (Signed)
Pt given water to drink as a po challenge

## 2019-03-03 NOTE — ED Provider Notes (Signed)
[facility] EMERGENCY DEPARTMENT Provider Note   CSN: [id number] Arrival date & time: 03/03/19  0840     History Chief Complaint  Patient presents with  . Altered Mental Status    Sheri Valencia is a 73 y.o. female.  The history is provided by the patient, the EMS personnel and medical records. No language interpreter was used.  Altered Mental Status      73 year old female with history of seizure currently on Lamictal brought here via EMS from home for evaluation of altered mental status. Patient is slow to respond to questions and not a reliable historian.  Patient called EMS to come to her house today due to having nausea.  Patient states she found herself on the floor for bathroom feeling dizzy and nauseous.  She does not recall how she got to the bathroom.  She does not know whether she had a seizure episode or not.  She does endorse a mild headache described as a throbbing sensation along with feeling nauseous.  She does not complain of any cold symptoms, no neck pain, chest pain, trouble breathing, abdominal pain, back pain, focal numbness or focal weakness but states she could not get off from the floor.  No report of tongue biting or urinary or bowel incontinence.  EMS report this four missing Zoloft pill in her pill bottle.  Patient denies feeling depressed or having HI or SI.  Patient lives at home by herself.  EMS report patient was calling out for her deceased husband.   Past Medical History:  Diagnosis Date  . Memory loss   . Seizure [facility])     Patient Active Problem List   Diagnosis Date Noted  . Mild cognitive impairment 07/05/2017  . Polypharmacy 07/05/2017  . Cervical spondylosis without myelopathy 06/25/2013  . Gait difficulty 05/22/2013  . Seizure [facility])     Past Surgical History:  Procedure Laterality Date  . APPENDECTOMY    . TUBAL LIGATION       OB History   No obstetric history on file.     Family History  Problem Relation Age of Onset  .  Pneumonia Mother   . Heart attack Maternal Grandmother   . Stroke Maternal Grandmother   . Suicidality Brother   . Aneurysm Maternal Aunt   . Breast cancer Sister        04/2015  Stage I    Social History   Tobacco Use  . Smoking status: Never Smoker  . Smokeless tobacco: Never Used  Substance Use Topics  . Alcohol use: Yes    Alcohol/week: 1.0 standard drinks    Types: 1 Glasses of wine per week  . Drug use: No    Home Medications Prior to Admission medications   Medication Sig Start Date End Date Taking? Authorizing Provider  aspirin 81 MG tablet Take 81 mg by mouth daily.    [provider]  Calcium Carbonate-Vit D-Min (CALTRATE PLUS PO) Take by mouth daily.    [provider]  LamoTRIgine 200 MG TB24 24 hour tablet Take 2 tablets (400 mg total) by mouth at bedtime. 10/31/18   Marcial Pacas, MD  lisinopril (PRINIVIL,ZESTRIL) 20 MG tablet Take 20 mg by mouth daily. 05/03/13   [provider]  memantine (NAMENDA) 10 MG tablet Take 1 tablet (10 mg total) by mouth 2 (two) times daily. 11/29/18   Marcial Pacas, MD  Multiple Vitamin (VITAMIN E/FOLIC MHDQ/Q-2/W-97) CAPS Take by mouth daily.    [provider]  propranolol (  INDERAL) 20 MG tablet 20 mg daily. 04/26/16   [provider]  sertraline (ZOLOFT) 100 MG tablet Take 100 mg by mouth daily. 05/03/13   [provider]  VITAMIN D, ERGOCALCIFEROL, PO Take by mouth daily.    [provider]    Allergies    Ativan [lorazepam]  Review of Systems   Review of Systems  Unable to perform ROS: Mental status change  All other systems reviewed and are negative.   Physical Exam Updated Vital Signs BP (!) 155/68   Pulse 95   Temp 97.9 F (36.6 C) (Oral)   Resp 19   Ht 5' (1.524 m)   Wt 45.1 kg   SpO2 98%   BMI 19.42 kg/m   Physical Exam Vitals and nursing note reviewed.  Constitutional:      General: She is not in acute distress.    Appearance: She is well-developed.    HENT:     Head: Normocephalic and atraumatic.     Comments: No scalp tenderness or signs of scalp injury, no midface tenderness. Eyes:     Extraocular Movements: Extraocular movements intact.     Conjunctiva/sclera: Conjunctivae normal.     Pupils: Pupils are equal, round, and reactive to light.  Cardiovascular:     Rate and Rhythm: Normal rate and regular rhythm.     Pulses: Normal pulses.     Heart sounds: Normal heart sounds.  Pulmonary:     Effort: Pulmonary effort is normal.     Breath sounds: Normal breath sounds. No wheezing, rhonchi or rales.  Abdominal:     Palpations: Abdomen is soft.     Tenderness: There is no abdominal tenderness.  Musculoskeletal:     Cervical back: Normal range of motion and neck supple.  Skin:    Findings: No rash.  Neurological:     Mental Status: She is alert.     GCS: GCS eye subscore is 4. GCS verbal subscore is 5. GCS motor subscore is 6.     Cranial Nerves: Cranial nerves are intact.     Sensory: Sensation is intact.     Motor: Motor function is intact.     Comments: Alert to place, situation, and year but unable to recall month, or current president  5 out of 5 strength to all 4 extremities.     ED Results / Procedures / Treatments   Labs (all labs ordered are listed, but only abnormal results are displayed) Labs Reviewed  CBC WITH DIFFERENTIAL/PLATELET - Abnormal; Notable for the following components:      Result Value   WBC 12.4 (*)    Hemoglobin 11.3 (*)    HCT 35.5 (*)    Neutro Abs 10.8 (*)    All other components within normal limits  COMPREHENSIVE METABOLIC PANEL - Abnormal; Notable for the following components:   Potassium 3.4 (*)    Glucose, Bld 122 (*)    Calcium 8.8 (*)    Albumin 3.4 (*)    All other components within normal limits  URINALYSIS, ROUTINE W REFLEX MICROSCOPIC - Abnormal; Notable for the following components:   Color, Urine STRAW (*)    Hgb urine dipstick SMALL (*)    Leukocytes,Ua SMALL (*)     Bacteria, UA FEW (*)    All other components within normal limits  SARS CORONAVIRUS 2 (TAT 6-24 HRS)  URINE CULTURE  ETHANOL  RAPID URINE DRUG SCREEN, HOSP PERFORMED  AMMONIA  LAMOTRIGINE LEVEL  LEVETIRACETAM LEVEL  CBG MONITORING,  ED    EKG EKG Interpretation  Date/Time:  /27/20 1217         9:06 AM This is an elderly female with mild cognitive impairment as well as history of seizure currently on Lamictal presenting with altered mental status.  Patient found herself on the floor of the bathroom this morning, confused, with nausea and dizziness.  Unsure if she has had a seizure episode.  No signs of injury on exam.  Work-up initiated. Last known well was last night. Care discussed with Dr. Hyacinth Meeker.    10:43 AM Patient does not have any focal neuro deficit except for some coordination issue with finger-to-nose and heel-to-shin bilaterally.  Head CT scan unremarkable.  Appreciate consultation from neurologist, Dr. Amada Jupiter, who recommend checking a Lamictal level as well as a Keppra level and also to perform an orthostatic vital sign.  Encourage outpatient follow-up with neurology and does not need an MRI at this time is indicated for stroke.  11:27 AM Patient endorsed on nausea, antinausea medication given.  Will perform a p.o. trial prior to discharge.  I did discuss care with patient and sister who is at bedside.  Agreeable with following up closely with her neurologist, Dr. Terrace Arabia.  Suspect unwitnessed seizure today causing her symptoms.  12:59 PM Patient tolerated p.o., felt better.  She is stable for discharge.  Return precaution discussed.  Colin BroachKaren S Oliveto was evaluated in Emergency Department on 03/03/2019 for the symptoms described in the history of present illness. She was evaluated in the context of the global COVID-19 pandemic, which necessitated consideration that the patient might be at risk for infection with the  SARS-CoV-2 virus that causes COVID-19. Institutional protocols and algorithms that pertain to the evaluation of patients at risk for COVID-19 are in a state of rapid change based on information released by regulatory bodies including the CDC and federal and state organizations. These policies and algorithms were followed during the patient's care in the ED.   Fayrene Helperran, Dotty Gonzalo, PA-C 03/03/19 1300  Eber HongMiller, Brian, MD 03/04/19 [phone]

## 2019-03-03 NOTE — ED Triage Notes (Signed)
Patient brought in via EMS from home. Patient alert with some confusion to time. Patient called EMS for nausea. Per patient woke up this morning in floor. Per paramedic patient has confusion but has hx of dementia. Patient does live by herself. Patient repetitive asking for "Romie Minus" her deceased husband. Patient did called EMS herself and requested to come to [facility] per paramedic. Patient does state headache. Patient does not take any type of anticoagulant and denies any pain any where else, dizziness, or blurred vision.

## 2019-03-03 NOTE — Discharge Instructions (Signed)
Your confusion, dizziness may be due to a seizure episode. Take Zofran as needed for nausea. Call and follow-up closely with your neurologist next week for further management. Return if you have any concern.

## 2019-03-04 DIAGNOSIS — R42 Dizziness and giddiness: Secondary | ICD-10-CM | POA: Diagnosis not present

## 2019-03-04 DIAGNOSIS — R2681 Unsteadiness on feet: Secondary | ICD-10-CM | POA: Diagnosis not present

## 2019-03-04 DIAGNOSIS — N390 Urinary tract infection, site not specified: Secondary | ICD-10-CM | POA: Diagnosis not present

## 2019-03-04 DIAGNOSIS — R531 Weakness: Secondary | ICD-10-CM | POA: Diagnosis not present

## 2019-03-04 DIAGNOSIS — F329 Major depressive disorder, single episode, unspecified: Secondary | ICD-10-CM | POA: Diagnosis not present

## 2019-03-04 DIAGNOSIS — W19XXXA Unspecified fall, initial encounter: Secondary | ICD-10-CM | POA: Diagnosis not present

## 2019-03-04 DIAGNOSIS — Z888 Allergy status to other drugs, medicaments and biological substances status: Secondary | ICD-10-CM | POA: Diagnosis not present

## 2019-03-04 DIAGNOSIS — Z79899 Other long term (current) drug therapy: Secondary | ICD-10-CM | POA: Diagnosis not present

## 2019-03-04 DIAGNOSIS — R197 Diarrhea, unspecified: Secondary | ICD-10-CM | POA: Diagnosis not present

## 2019-03-04 DIAGNOSIS — R112 Nausea with vomiting, unspecified: Secondary | ICD-10-CM | POA: Diagnosis not present

## 2019-03-04 LAB — SARS CORONAVIRUS 2 (TAT 6-24 HRS): SARS Coronavirus 2: NEGATIVE

## 2019-03-04 LAB — LAMOTRIGINE LEVEL: Lamotrigine Lvl: 30.1 ug/mL — ABNORMAL HIGH (ref 2.0–20.0)

## 2019-03-04 NOTE — Telephone Encounter (Signed)
I returned the call to the patient's sister.  She believes the patient may have had a seizure but she is not sure.  She is at the hospital with her now.  While on the phone, the patient was called from the waiting room. Her sister said she would call our office back with an update.

## 2019-03-04 NOTE — Telephone Encounter (Signed)
Patient sister Juliene Pina called wanting to get an update on the patients medication and which medication she was advised to discontinue.  Please follow up.

## 2019-03-05 LAB — URINE CULTURE: Culture: NO GROWTH

## 2019-03-05 LAB — LEVETIRACETAM LEVEL: Levetiracetam Lvl: <1 ug/mL — ABNORMAL LOW (ref 10.0–40.0)

## 2019-03-06 DIAGNOSIS — R569 Unspecified convulsions: Secondary | ICD-10-CM | POA: Diagnosis not present

## 2019-03-06 DIAGNOSIS — Z681 Body mass index (BMI) 19 or less, adult: Secondary | ICD-10-CM | POA: Diagnosis not present

## 2019-03-06 DIAGNOSIS — W19XXXA Unspecified fall, initial encounter: Secondary | ICD-10-CM | POA: Diagnosis not present

## 2019-03-06 DIAGNOSIS — N390 Urinary tract infection, site not specified: Secondary | ICD-10-CM | POA: Diagnosis not present

## 2019-03-06 DIAGNOSIS — Z299 Encounter for prophylactic measures, unspecified: Secondary | ICD-10-CM | POA: Diagnosis not present

## 2019-03-06 DIAGNOSIS — R2681 Unsteadiness on feet: Secondary | ICD-10-CM | POA: Diagnosis not present

## 2019-03-08 DIAGNOSIS — R42 Dizziness and giddiness: Secondary | ICD-10-CM | POA: Diagnosis not present

## 2019-03-08 DIAGNOSIS — Z9181 History of falling: Secondary | ICD-10-CM | POA: Diagnosis not present

## 2019-03-08 DIAGNOSIS — Z20822 Contact with and (suspected) exposure to covid-19: Secondary | ICD-10-CM | POA: Diagnosis not present

## 2019-03-08 DIAGNOSIS — I10 Essential (primary) hypertension: Secondary | ICD-10-CM | POA: Diagnosis not present

## 2019-03-08 DIAGNOSIS — R531 Weakness: Secondary | ICD-10-CM | POA: Diagnosis not present

## 2019-03-08 DIAGNOSIS — S199XXA Unspecified injury of neck, initial encounter: Secondary | ICD-10-CM | POA: Diagnosis not present

## 2019-03-08 DIAGNOSIS — G40909 Epilepsy, unspecified, not intractable, without status epilepticus: Secondary | ICD-10-CM | POA: Diagnosis not present

## 2019-03-08 DIAGNOSIS — R569 Unspecified convulsions: Secondary | ICD-10-CM | POA: Diagnosis not present

## 2019-03-08 DIAGNOSIS — S0990XA Unspecified injury of head, initial encounter: Secondary | ICD-10-CM | POA: Diagnosis not present

## 2019-03-08 DIAGNOSIS — F329 Major depressive disorder, single episode, unspecified: Secondary | ICD-10-CM | POA: Diagnosis not present

## 2019-03-08 DIAGNOSIS — N390 Urinary tract infection, site not specified: Secondary | ICD-10-CM | POA: Diagnosis not present

## 2019-03-08 DIAGNOSIS — R296 Repeated falls: Secondary | ICD-10-CM | POA: Diagnosis not present

## 2019-03-08 DIAGNOSIS — R1111 Vomiting without nausea: Secondary | ICD-10-CM | POA: Diagnosis not present

## 2019-03-08 DIAGNOSIS — R0902 Hypoxemia: Secondary | ICD-10-CM | POA: Diagnosis not present

## 2019-03-18 DIAGNOSIS — N390 Urinary tract infection, site not specified: Secondary | ICD-10-CM | POA: Diagnosis not present

## 2019-03-18 DIAGNOSIS — R296 Repeated falls: Secondary | ICD-10-CM | POA: Diagnosis not present

## 2019-03-18 DIAGNOSIS — Z9181 History of falling: Secondary | ICD-10-CM | POA: Diagnosis not present

## 2019-03-18 DIAGNOSIS — F329 Major depressive disorder, single episode, unspecified: Secondary | ICD-10-CM | POA: Diagnosis not present

## 2019-03-18 DIAGNOSIS — G40909 Epilepsy, unspecified, not intractable, without status epilepticus: Secondary | ICD-10-CM | POA: Diagnosis not present

## 2019-03-18 DIAGNOSIS — R42 Dizziness and giddiness: Secondary | ICD-10-CM | POA: Diagnosis not present

## 2019-03-19 DIAGNOSIS — G309 Alzheimer's disease, unspecified: Secondary | ICD-10-CM | POA: Diagnosis not present

## 2019-03-19 DIAGNOSIS — R569 Unspecified convulsions: Secondary | ICD-10-CM | POA: Diagnosis not present

## 2019-03-19 DIAGNOSIS — Z299 Encounter for prophylactic measures, unspecified: Secondary | ICD-10-CM | POA: Diagnosis not present

## 2019-03-19 DIAGNOSIS — I10 Essential (primary) hypertension: Secondary | ICD-10-CM | POA: Diagnosis not present

## 2019-03-19 DIAGNOSIS — Z681 Body mass index (BMI) 19 or less, adult: Secondary | ICD-10-CM | POA: Diagnosis not present

## 2019-03-19 DIAGNOSIS — Z789 Other specified health status: Secondary | ICD-10-CM | POA: Diagnosis not present

## 2019-03-19 DIAGNOSIS — N390 Urinary tract infection, site not specified: Secondary | ICD-10-CM | POA: Diagnosis not present

## 2019-03-20 ENCOUNTER — Telehealth: Payer: Self-pay | Admitting: Neurology

## 2019-03-20 NOTE — Telephone Encounter (Signed)
Tudor,[name](sister on DPR) has called asking for a call from RN to discuss a recent rapid decline within pt.  [name] states Pt has recently been in the hospital due to being unable to walk. Pt has very little reasoning, pt has had multiple falls.  Pt sister also asked it be mentioned that pt sits and cries while watching tv.  Please call

## 2019-03-20 NOTE — Telephone Encounter (Signed)
I returned the call to the patient's sister who reports the patient has recently been in the hospital and needs an earlier follow up.  States medication changes were made in the hospital and she will get the new list of medications for [place].  She has recently been seen at the ED in [place] and [employer] (she will get the records from [place]). She also had a UTI that is being actively treated by her PCP.

## 2019-03-25 ENCOUNTER — Telehealth: Payer: Self-pay | Admitting: Neurology

## 2019-03-25 DIAGNOSIS — R350 Frequency of micturition: Secondary | ICD-10-CM | POA: Diagnosis not present

## 2019-03-25 NOTE — Telephone Encounter (Signed)
Pt's sister [name] on [place] called wanting to know if the pt should still come in due to her still having the UTI. Please advise.

## 2019-03-25 NOTE — Telephone Encounter (Signed)
I returned the call to the patient's sister. She informed me the patient is still being treated for an active UTI. She wants to keep her appt w/ Dr. Terrace Arabia on 04/01/2019. She would feel more comfortable to have her physically evaluated.

## 2019-03-29 DIAGNOSIS — I10 Essential (primary) hypertension: Secondary | ICD-10-CM | POA: Diagnosis not present

## 2019-03-29 DIAGNOSIS — W19XXXA Unspecified fall, initial encounter: Secondary | ICD-10-CM | POA: Diagnosis not present

## 2019-03-29 DIAGNOSIS — Z681 Body mass index (BMI) 19 or less, adult: Secondary | ICD-10-CM | POA: Diagnosis not present

## 2019-03-29 DIAGNOSIS — M4696 Unspecified inflammatory spondylopathy, lumbar region: Secondary | ICD-10-CM | POA: Diagnosis not present

## 2019-03-29 DIAGNOSIS — Z789 Other specified health status: Secondary | ICD-10-CM | POA: Diagnosis not present

## 2019-03-29 DIAGNOSIS — R569 Unspecified convulsions: Secondary | ICD-10-CM | POA: Diagnosis not present

## 2019-03-29 DIAGNOSIS — Z299 Encounter for prophylactic measures, unspecified: Secondary | ICD-10-CM | POA: Diagnosis not present

## 2019-04-01 ENCOUNTER — Encounter: Payer: Self-pay | Admitting: Neurology

## 2019-04-01 ENCOUNTER — Other Ambulatory Visit: Payer: Self-pay

## 2019-04-01 ENCOUNTER — Ambulatory Visit (INDEPENDENT_AMBULATORY_CARE_PROVIDER_SITE_OTHER): Payer: PPO | Admitting: Neurology

## 2019-04-01 VITALS — BP 126/68 | HR 88 | Temp 97.8°F

## 2019-04-01 DIAGNOSIS — R569 Unspecified convulsions: Secondary | ICD-10-CM | POA: Diagnosis not present

## 2019-04-01 DIAGNOSIS — R269 Unspecified abnormalities of gait and mobility: Secondary | ICD-10-CM

## 2019-04-01 DIAGNOSIS — G3184 Mild cognitive impairment, so stated: Secondary | ICD-10-CM | POA: Diagnosis not present

## 2019-04-01 DIAGNOSIS — Z111 Encounter for screening for respiratory tuberculosis: Secondary | ICD-10-CM | POA: Diagnosis not present

## 2019-04-01 DIAGNOSIS — R350 Frequency of micturition: Secondary | ICD-10-CM | POA: Diagnosis not present

## 2019-04-01 NOTE — Progress Notes (Signed)
PATIENT: Sheri Valencia DOB: [birth year]  Chief Complaint  Patient presents with  . Seizures    She is here with her sister, Sheri Valencia. Follow up from ED visits for inability to walk and disorientation. She was seen at both [facility] and [facility]. Her sister is concerned about seizure activity. She was also diagnosed with an active UTI. She is still under the care of her PCP and is currently on her second round of antibiotics.     HISTORICAL Sheri Valencia is a 74 years old right-handed Caucasian female,followed for complex partial seizure with secondary generalization. She has a history of complex partial seizure with secondary generalization since age 18, presented with rising sensation, then passed out followed by generalized motor seizure, last seizure was in 2002, she was treated with Lamictal 200 mg twice a day, continue to have recurrent seizure, Keppra 500 mg 2 tablets twice a day was added on, no recurrent seizure since. 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 Other levels alsodemonstrate significant stenosis.. She denies significant neck pain She has mild unsteady gait, always contributed to her left hip problem,  She was evaluated recently  by Dr. Channing Mutters, neurosurgeon, she follows up yearly   She returns for reevaluation and refills. She is on Aricept by PCP Dr. [name].   UPDATE Jul 05 2017:  I was able to review the letter from her  Sister Sheri Valencia, she is accompanied by her friend Sheri Valencia at today's  clinical visit,  "From her sister's letter: Sheri Valencia continue has memory problem, easily gets confused, gets frustrated easily, but deny her problem, there was also description of paranoid, emotional incontinence, forgetful, poor decision-making process for her health, irrational spell, recent purchase of a new car, but difficult to operating her new vehicle, impulsive action, got lost while driving, difficulty working with her computer, loose piece constantly, difficulty managing her bills, ongoing since 2017, progressively getting worse"  Sheri Valencia drove her here today, she has lived [place] for 50 years, 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   She likes to walk, reading.  She lives in a codominum.   She has not have seizure for few years. 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  UPDATE October 26 2017: She was able to taper off Keppra 500 mg twice daily, only on lamotrigine 200 mg twice a day, doing better, along with Namenda 10 mg twice a day, her memory seems to improve will stabilize, care of her elderly aunt,  We personally reviewed MRI of the brain in May 2019, mild  generalized atrophy no acute abnormality,  EEG was normal   Laboratory evaluation in May 2019 showed normal TSH, B12, RPR, CBC, CMP showed mild elevated 1.25,  with GFR of 44, Keppra level was 66, lamotrigine level was 22, she has no signs of toxicity, the level was not trough level  Virtual Visit via Video on Jul 11, 2018  She is with her sister Sheri Valencia at visit.  She had no recurrent seizure, taking lamotrigine 200 mg twice a day, Keppra 500 mg twice a day, he complains of increased confusion, sometimes unsteady gait, fell few times, sister also concerned about worsening memory loss, had few incident of mis- handling her debit card,  Previously we have tried to taper off Keppra 500 mg 2 tablets twice a day, because her underweight, has not had recurrent seizure for many years, but she is concerned about potential recurrent seizure, put herself back on Keppra 1 tablets twice a day,   Laboratory evaluations on Jul 05, 2017, normal TSH 1.9, lamotrigine 22.7, Keppra 66.5, normal B12, CBC hemoglobin of 11.1, creatinine of 1.25, potassium of 5.4  Update October 31, 2018: She is with her sister Sheri Valencia at today's visit, she continue to take lamotrigine 200 mg twice a day, has no recurrent seizure, she has worsening memory loss, excessive daytime sleepiness, fatigue, continue have depression, taking Zoloft 100 mg daily, she complains of change of taste, has decreased appetite, with some weight loss,  Laboratory evaluations on Jul 12, 2018 showed lamotrigine level was 48,  UPDATE Apr 05 2019: She is accompanied by her Sister Sheri Valencia at today's clinical visit, before Christmas, she complains of few days history of poor appetite, frequent diarrhea, on March 03, 2019, she woke up on the floor, has difficulty getting up from the floor, had a transient loss of consciousness," but felt like it is not a seizure", she called ambulance, was brought to the emergency room at [facility], lamotrigine level was 30, I personally reviewed CT head without contrast showed no acute findings UA showed evidence of UTI, her symptoms improved with hydration,  However, she remained confused,  weak, difficulty walking was admitted to [facility] on March 08, 2019, personally reviewed record, CT head showed no acute abnormality, CT of cervical spine showed multilevel degenerative changes, most severe at C5-6, C6 and 7,  She was treated for UTI, planning on to take Macrobid 100 mg daily for 1 month, there was no recurrent seizure activity, she is back on her home regimen of lamotrigine ER 200 mg 2 tablets every night  She does complains of low back pain, continue to feel generalized weakness, gait abnormality, "I have to think about how to walk my leg", also worsening urinary urgency, incontinence  Laboratory evaluation showed mild anemia hemoglobin of 10.8 CMP showed mildly low potassium 3.4, decreased albumin 3.4  She now lives with her sister temporarily, planning on for short-term assisted living placement due to her continued gait abnormality, worsening confusion,   REVIEW OF SYSTEMS: Full 14 system review of systems performed and notable only for as above All other review of systems were negative.  ALLERGIES: Allergies  Allergen Reactions  . Ativan [Lorazepam]     confusion    HOME MEDICATIONS: Current Outpatient Medications  Medication Sig Dispense Refill  . aspirin 81 MG tablet Take 81 mg by mouth daily.    . Calcium Carbonate-Vit D-Min (CALTRATE PLUS PO) Take by mouth daily.    . LamoTRIgine 200 MG TB24 24 hour tablet Take 2 tablets (  400 mg total) by mouth at bedtime. 180 tablet 4  . lisinopril (PRINIVIL,ZESTRIL) 20 MG tablet Take 20 mg by mouth daily.    . memantine (NAMENDA) 10 MG tablet Take 1 tablet (10 mg total) by mouth 2 (two) times daily. 180 tablet 3  . Multiple Vitamin (VITAMIN E/FOLIC ACID/B-6/B-12) CAPS Take by mouth daily.    . ondansetron (ZOFRAN) 4 MG tablet Take 1 tablet (4 mg total) by mouth every 8 (eight) hours as needed for nausea or vomiting. 5 tablet 0  . propranolol (INDERAL) 20 MG tablet 20 mg daily.    . sertraline (ZOLOFT) 100 MG tablet  Take 100 mg by mouth daily.    Marland Kitchen VITAMIN D, ERGOCALCIFEROL, PO Take by mouth daily.     No current facility-administered medications for this visit.    PAST MEDICAL HISTORY: Past Medical History:  Diagnosis Date  . Memory loss   . Seizure (HCC)     PAST SURGICAL HISTORY: Past Surgical History:  Procedure Laterality Date  . APPENDECTOMY    . TUBAL LIGATION      FAMILY HISTORY: Family History  Problem Relation Age of Onset  . Pneumonia Mother   . Heart attack Maternal Grandmother   . Stroke Maternal Grandmother   . Suicidality Brother   . Aneurysm Maternal Aunt   . Breast cancer Sister        04/2015  Stage I    SOCIAL HISTORY: Social History   Socioeconomic History  . Marital status: Widowed    Spouse name: Not on file  . Number of children: 0  . Years of education: college  . Highest education level: Not on file  Occupational History    Employer: RETIRED    Comment: retired  Tobacco Use  . Smoking status: Never Smoker  . Smokeless tobacco: Never Used  Substance and Sexual Activity  . Alcohol use: Yes    Alcohol/week: 1.0 standard drinks    Types: 1 Glasses of wine per week  . Drug use: No  . Sexual activity: Not on file  Other Topics Concern  . Not on file  Social History Narrative   Patient lives at home alone and she is widowed. Retired.   College education    Caffeine one cup daily.         Social Determinants of Health   Financial Resource Strain:   . Difficulty of Paying Living Expenses: Not on file  Food Insecurity:   . Worried About Programme researcher, broadcasting/film/video in the Last Year: Not on file  . Ran Out of Food in the Last Year: Not on file  Transportation Needs:   . Lack of Transportation (Medical): Not on file  . Lack of Transportation (Non-Medical): Not on file  Physical Activity:   . Days of Exercise per Week: Not on file  . Minutes of Exercise per Session: Not on file  Stress:   . Feeling of Stress : Not on file  Social Connections:   .  Frequency of Communication with Friends and Family: Not on file  . Frequency of Social Gatherings with Friends and Family: Not on file  . Attends Religious Services: Not on file  . Active Member of Clubs or Organizations: Not on file  . Attends Banker Meetings: Not on file  . Marital Status: Not on file  Intimate Partner Violence:   . Fear of Current or Ex-Partner: Not on file  . Emotionally Abused: Not on file  . Physically Abused: Not  on file  . Sexually Abused: Not on file     PHYSICAL EXAM   Vitals:   04/01/19 1553  Height: 5' (1.524 m)    Not recorded      Body mass index is 19.42 kg/m.  PHYSICAL EXAMNIATION:  Gen: NAD, conversant, well nourised, well groomed                     Cardiovascular: Regular rate rhythm, no peripheral edema, warm, nontender. Eyes: Conjunctivae clear without exudates or hemorrhage Neck: Supple, no carotid bruits. Pulmonary: Clear to auscultation bilaterally   NEUROLOGICAL EXAM  MMSE - Mini Mental State Exam 04/01/2019 10/31/2018 07/05/2017  Orientation to time 3 5 5   Orientation to Place 5 5 5   Registration 3 3 3   Attention/ Calculation 5 5 5   Recall 2 2 3   Language- name 2 objects 2 2 2   Language- repeat 1 1 1   Language- follow 3 step command 3 3 3   Language- read & follow direction 1 1 1   Write a sentence 1 1 1   Copy design 0 1 1  Total score 26 29 30   Animal naming 7  CRANIAL NERVES: CN II: Visual fields are full to confrontation.   Pupils are round equal and briskly reactive to light. CN III, IV, VI: extraocular movement are normal. No ptosis. CN V: Facial sensation is intact to pinprick in all 3 divisions bilaterally. Corneal responses are intact.  CN VII: Face is symmetric  CN VIII: Hearing is normal to casual conversation CN IX, X: Palate elevates symmetrically. Phonation is normal. CN XI: Head turning and shoulder shrug are intact   MOTOR: There is no pronator drift of out-stretched arms. Muscle bulk and  tone are normal. Muscle strength is normal.  REFLEXES: Reflexes are 2+ and symmetric at the biceps, triceps, 3/3 knees, and ankles. Plantar responses are flexor.  SENSORY: Intact to light touch, pinprick, positional sensation and vibratory sensation are intact in fingers and toes.  COORDINATION: Rapid alternating movements and fine finger movements are intact. There is no dysmetria on finger-to-nose and heel-knee-shin.    GAIT/STANCE: She needs to push up to get up from seated position, unsteady,   DIAGNOSTIC DATA (LABS, IMAGING, TESTING) - I reviewed patient records, labs, notes, testing and imaging myself where available.   ASSESSMENT AND PLAN  Sheri Valencia is a 74 y.o. female   Complex partial seizure with secondary generalization, last reported seizure was in 2002, Polypharmacy treatment, Depression Mild cognitive impairment Worsening gait abnormality  Hyperreflexia on examinations, previous MRI of the cervical spine, and recent CT cervical spine showed multilevel degenerative changes   MRI of cervical spine to rule out spondylitic myelopathy  She has lost weight, lamotrigine level was 30, which likely contributed to her complaints of dizziness, confusion, and gait abnormality, will decrease lamotrigine ER 200 mg 1 tablet every night   Return to clinic in 6 weeks      Levert Feinstein, M.D. Ph.D.  [facility] [address] [place], [place] [address] Ph: [phone] Fax: [phone]  CC: Referring Provider

## 2019-04-04 DIAGNOSIS — Z6821 Body mass index (BMI) 21.0-21.9, adult: Secondary | ICD-10-CM | POA: Diagnosis not present

## 2019-04-04 DIAGNOSIS — M4696 Unspecified inflammatory spondylopathy, lumbar region: Secondary | ICD-10-CM | POA: Diagnosis not present

## 2019-04-04 DIAGNOSIS — R2681 Unsteadiness on feet: Secondary | ICD-10-CM | POA: Diagnosis not present

## 2019-04-04 DIAGNOSIS — Z299 Encounter for prophylactic measures, unspecified: Secondary | ICD-10-CM | POA: Diagnosis not present

## 2019-04-04 DIAGNOSIS — I10 Essential (primary) hypertension: Secondary | ICD-10-CM | POA: Diagnosis not present

## 2019-04-04 DIAGNOSIS — M1612 Unilateral primary osteoarthritis, left hip: Secondary | ICD-10-CM | POA: Diagnosis not present

## 2019-04-08 DIAGNOSIS — R296 Repeated falls: Secondary | ICD-10-CM | POA: Diagnosis not present

## 2019-04-08 DIAGNOSIS — Z9181 History of falling: Secondary | ICD-10-CM | POA: Diagnosis not present

## 2019-04-08 DIAGNOSIS — R42 Dizziness and giddiness: Secondary | ICD-10-CM | POA: Diagnosis not present

## 2019-04-08 DIAGNOSIS — N390 Urinary tract infection, site not specified: Secondary | ICD-10-CM | POA: Diagnosis not present

## 2019-04-08 DIAGNOSIS — G40909 Epilepsy, unspecified, not intractable, without status epilepticus: Secondary | ICD-10-CM | POA: Diagnosis not present

## 2019-04-08 DIAGNOSIS — F329 Major depressive disorder, single episode, unspecified: Secondary | ICD-10-CM | POA: Diagnosis not present

## 2019-04-10 ENCOUNTER — Telehealth: Payer: Self-pay

## 2019-04-10 NOTE — Telephone Encounter (Signed)
I returned the call to the patient's sister, [name]. She confirmed that she already has a prescription of Valium 5mg  at home with the directions to take 1-2 daily. She will use this medication, as prescribed, for her cervical MRI. The patient is not driving at this time. She will be transported to and from the appt.

## 2019-04-10 NOTE — Telephone Encounter (Signed)
Patient sister called stating that they would like to know if the patient could be prescribed something since she is claustrophobic and will be getting her CT scan done.  Please follow up.

## 2019-04-11 DIAGNOSIS — M4696 Unspecified inflammatory spondylopathy, lumbar region: Secondary | ICD-10-CM | POA: Diagnosis not present

## 2019-04-11 DIAGNOSIS — F329 Major depressive disorder, single episode, unspecified: Secondary | ICD-10-CM | POA: Diagnosis not present

## 2019-04-11 DIAGNOSIS — N390 Urinary tract infection, site not specified: Secondary | ICD-10-CM | POA: Diagnosis not present

## 2019-04-11 DIAGNOSIS — R42 Dizziness and giddiness: Secondary | ICD-10-CM | POA: Diagnosis not present

## 2019-04-11 DIAGNOSIS — I10 Essential (primary) hypertension: Secondary | ICD-10-CM | POA: Diagnosis not present

## 2019-04-11 DIAGNOSIS — Z681 Body mass index (BMI) 19 or less, adult: Secondary | ICD-10-CM | POA: Diagnosis not present

## 2019-04-11 DIAGNOSIS — Z299 Encounter for prophylactic measures, unspecified: Secondary | ICD-10-CM | POA: Diagnosis not present

## 2019-04-11 DIAGNOSIS — E440 Moderate protein-calorie malnutrition: Secondary | ICD-10-CM | POA: Diagnosis not present

## 2019-04-11 DIAGNOSIS — G40909 Epilepsy, unspecified, not intractable, without status epilepticus: Secondary | ICD-10-CM | POA: Diagnosis not present

## 2019-04-11 DIAGNOSIS — T7840XA Allergy, unspecified, initial encounter: Secondary | ICD-10-CM | POA: Diagnosis not present

## 2019-04-16 ENCOUNTER — Ambulatory Visit: Payer: PPO | Admitting: Neurology

## 2019-04-18 DIAGNOSIS — E440 Moderate protein-calorie malnutrition: Secondary | ICD-10-CM | POA: Diagnosis not present

## 2019-04-18 DIAGNOSIS — M4696 Unspecified inflammatory spondylopathy, lumbar region: Secondary | ICD-10-CM | POA: Diagnosis not present

## 2019-04-18 DIAGNOSIS — F322 Major depressive disorder, single episode, severe without psychotic features: Secondary | ICD-10-CM | POA: Diagnosis not present

## 2019-04-18 DIAGNOSIS — Z299 Encounter for prophylactic measures, unspecified: Secondary | ICD-10-CM | POA: Diagnosis not present

## 2019-04-18 DIAGNOSIS — I10 Essential (primary) hypertension: Secondary | ICD-10-CM | POA: Diagnosis not present

## 2019-04-18 DIAGNOSIS — R569 Unspecified convulsions: Secondary | ICD-10-CM | POA: Diagnosis not present

## 2019-04-22 ENCOUNTER — Ambulatory Visit: Payer: PPO | Admitting: Neurology

## 2019-04-26 DIAGNOSIS — M4696 Unspecified inflammatory spondylopathy, lumbar region: Secondary | ICD-10-CM | POA: Diagnosis not present

## 2019-04-26 DIAGNOSIS — Z681 Body mass index (BMI) 19 or less, adult: Secondary | ICD-10-CM | POA: Diagnosis not present

## 2019-04-26 DIAGNOSIS — I10 Essential (primary) hypertension: Secondary | ICD-10-CM | POA: Diagnosis not present

## 2019-04-26 DIAGNOSIS — F322 Major depressive disorder, single episode, severe without psychotic features: Secondary | ICD-10-CM | POA: Diagnosis not present

## 2019-04-26 DIAGNOSIS — Z299 Encounter for prophylactic measures, unspecified: Secondary | ICD-10-CM | POA: Diagnosis not present

## 2019-04-26 DIAGNOSIS — E440 Moderate protein-calorie malnutrition: Secondary | ICD-10-CM | POA: Diagnosis not present

## 2019-04-26 DIAGNOSIS — R569 Unspecified convulsions: Secondary | ICD-10-CM | POA: Diagnosis not present

## 2019-04-29 ENCOUNTER — Other Ambulatory Visit: Payer: Self-pay

## 2019-04-29 ENCOUNTER — Ambulatory Visit
Admission: RE | Admit: 2019-04-29 | Discharge: 2019-04-29 | Disposition: A | Discharge status: Home / Self Care | Payer: PPO | Source: Ambulatory Visit | Attending: Neurology | Admitting: Neurology

## 2019-04-29 DIAGNOSIS — G3184 Mild cognitive impairment, so stated: Secondary | ICD-10-CM

## 2019-04-29 DIAGNOSIS — R269 Unspecified abnormalities of gait and mobility: Secondary | ICD-10-CM | POA: Diagnosis not present

## 2019-04-29 DIAGNOSIS — R569 Unspecified convulsions: Secondary | ICD-10-CM

## 2019-05-02 ENCOUNTER — Telehealth: Payer: Self-pay | Admitting: Neurology

## 2019-05-02 NOTE — Telephone Encounter (Signed)
IMPRESSION:   MRI cervical spine (without) demonstrating: - at C5-6 rightward disc protrusion with borderline mild spinal stenosis and severe right foraminal stenosis; no cord signal changes. - at C3-4 uncovertebral joint hypertrophy with severe bilateral foraminal stenosis. - at C6-7 facet hypertrophy with moderate bilateral foraminal stenosis. - at C4-5 left uncovertebral joint and facet hypertrophy with moderate left foraminal stenosis.  Please call patient, MRI of cervical spine showed multilevel degenerative changes, no evidence of spinal cord compression, variable degree of foraminal narrowing, this might explain some of her neck pain, but will not explain current gait abnormality,

## 2019-05-02 NOTE — Telephone Encounter (Signed)
I spoke to her sister, [name] (on [place]) who verbalized understanding of the MRI results. The patient will keep pending appt on 05/14/2019.

## 2019-05-06 DIAGNOSIS — R296 Repeated falls: Secondary | ICD-10-CM | POA: Diagnosis not present

## 2019-05-06 DIAGNOSIS — N390 Urinary tract infection, site not specified: Secondary | ICD-10-CM | POA: Diagnosis not present

## 2019-05-06 DIAGNOSIS — F329 Major depressive disorder, single episode, unspecified: Secondary | ICD-10-CM | POA: Diagnosis not present

## 2019-05-06 DIAGNOSIS — Z9181 History of falling: Secondary | ICD-10-CM | POA: Diagnosis not present

## 2019-05-06 DIAGNOSIS — R42 Dizziness and giddiness: Secondary | ICD-10-CM | POA: Diagnosis not present

## 2019-05-06 DIAGNOSIS — G40909 Epilepsy, unspecified, not intractable, without status epilepticus: Secondary | ICD-10-CM | POA: Diagnosis not present

## 2019-05-06 NOTE — Telephone Encounter (Signed)
error 

## 2019-05-08 ENCOUNTER — Ambulatory Visit: Payer: PPO | Admitting: Neurology

## 2019-05-14 ENCOUNTER — Ambulatory Visit (INDEPENDENT_AMBULATORY_CARE_PROVIDER_SITE_OTHER): Payer: PPO | Admitting: Neurology

## 2019-05-14 ENCOUNTER — Other Ambulatory Visit: Payer: Self-pay

## 2019-05-14 ENCOUNTER — Encounter: Payer: Self-pay | Admitting: Neurology

## 2019-05-14 VITALS — BP 148/72 | HR 72 | Temp 97.0°F | Ht 60.0 in | Wt 90.0 lb

## 2019-05-14 DIAGNOSIS — G3184 Mild cognitive impairment, so stated: Secondary | ICD-10-CM

## 2019-05-14 DIAGNOSIS — R569 Unspecified convulsions: Secondary | ICD-10-CM | POA: Diagnosis not present

## 2019-05-14 DIAGNOSIS — R269 Unspecified abnormalities of gait and mobility: Secondary | ICD-10-CM

## 2019-05-14 MED ORDER — LAMOTRIGINE ER 200 MG PO TB24: 200.0000 mg | ORAL_TABLET | Freq: Every evening | ORAL | 4 refills | Status: DC

## 2019-05-14 NOTE — Progress Notes (Signed)
PATIENT: Sheri Valencia DOB: Oct 26, 1945  Chief Complaint  Patient presents with  . Seizures/Gait Issues    She has been at [facility] for rehab since January. She is doing better. She feels more clear-minded, starting to gain weight again, no seizure-like activity, gait is stable (no longer using walker). She hopes her improvements continue enough for her to go home soon. They would like to review her MRI cervical scan.      HISTORICAL Sheri Valencia is a 74 years old right-handed Caucasian female,followed for complex partial seizure with secondary generalization. She has a history of complex partial seizure with secondary generalization since age 30, presented with rising sensation, then passed out followed by generalized motor seizure, last seizure was in 2002, she was treated with Lamictal 200 mg twice a day, continue to have recurrent seizure, Keppra 500 mg 2 tablets twice a day was added on, no recurrent seizure since. 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 Other levels alsodemonstrate significant stenosis.. She denies significant neck pain She has mild unsteady gait, always contributed to her left hip problem,  She was evaluated recently  by Dr. Channing Mutters, neurosurgeon, she follows up yearly   She returns for reevaluation and refills. She is on Aricept by PCP Dr. [name].   UPDATE Jul 05 2017:  I was able to review the letter from her  Sister Sheri Valencia, she is accompanied by her friend Sheri Valencia at today's  clinical visit,  "From her sister's letter: Sheri Valencia continue has memory problem, easily gets confused, gets frustrated easily, but deny her problem, there was also description of paranoid, emotional incontinence, forgetful, poor decision-making process for her health, irrational spell, recent purchase of a new car, but difficult to operating her new vehicle, impulsive action, got lost while driving, difficulty working with her computer, loose piece constantly, difficulty managing her bills, ongoing since 2017, progressively getting worse"  Sheri Valencia drove her here today, she has lived [place] for 50 years, 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   She likes to walk, reading.  She lives in a codominum.   She has not have seizure for few years. 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  UPDATE October 26 2017: She was able to taper off Keppra 500 mg twice daily, only on lamotrigine 200 mg twice a day, doing better, along with Namenda 10 mg twice a day, her memory seems to improve will stabilize, care of her elderly aunt,  We personally reviewed MRI of the brain in May 2019, mild generalized atrophy no acute abnormality,  EEG was normal   Laboratory evaluation in May 2019 showed normal TSH, B12, RPR, CBC, CMP showed mild elevated 1.25,  with GFR of 44, Keppra level was 66, lamotrigine level was 22, she has no signs of toxicity, the level was not trough level  Virtual Visit via Video on Jul 11, 2018  She is with her sister Sheri Valencia at visit.  She had no recurrent seizure, taking lamotrigine 200 mg twice a day, Keppra 500 mg twice a day, he complains of increased confusion, sometimes unsteady gait, fell few times, sister also concerned about worsening memory loss, had few incident of mis- handling her debit card,  Previously we have tried to taper off Keppra 500 mg 2 tablets twice a day, because her underweight, has not had recurrent seizure for many years, but she is concerned about potential recurrent seizure, put herself back on Keppra 1 tablets twice a day,   Laboratory evaluations on Jul 05, 2017, normal TSH 1.9, lamotrigine 22.7, Keppra 66.5, normal B12, CBC hemoglobin of 11.1, creatinine of 1.25, potassium of 5.4  Update October 31, 2018: She is with her sister Sheri Valencia at today's visit, she continue to take lamotrigine 200 mg twice a day, has no recurrent seizure, she has worsening memory loss, excessive daytime sleepiness, fatigue, continue have depression, taking Zoloft 100 mg daily, she complains of change of taste, has decreased appetite, with some weight loss,  Laboratory evaluations on Jul 12, 2018 showed lamotrigine level was 62,  UPDATE Apr 05 2019: She is accompanied by her Sister Sheri Valencia at today's clinical visit, before Christmas, she complains of few days history of poor appetite, frequent diarrhea, on March 03, 2019, she woke up on the floor, has difficulty getting up from the floor, had a transient loss of consciousness," but felt like it is not a seizure", she called ambulance, was brought to the emergency room at [facility], lamotrigine level was 30, I personally reviewed CT head without contrast showed no acute findings UA showed evidence of UTI, her symptoms improved with hydration,  However, she remained confused,  weak, difficulty walking was admitted to [facility] on March 08, 2019, personally reviewed record, CT head showed no acute abnormality, CT of cervical spine showed multilevel degenerative changes, most severe at C5-6, C6 and 7,  She was treated for UTI, planning on to take Macrobid 100 mg daily for 1 month, there was no recurrent seizure activity, she is back on her home regimen of lamotrigine ER 200 mg 2 tablets every night  She does complains of low back pain, continue to feel generalized weakness, gait abnormality, "I have to think about how to walk my leg", also worsening urinary urgency, incontinence  Laboratory evaluation showed mild anemia hemoglobin of 10.8 CMP showed mildly low potassium 3.4, decreased albumin 3.4  She now lives with her sister temporarily, planning on for short-term assisted living placement due to her continued gait abnormality, worsening confusion,   UPDATE May 14 2019: She is with her sister at today's clinical visit, she is currently lives at at [facility], she had a significant improvement, no gait abnormality, no recurrent seizure, is planning on going home soon.  Acute onset of confusion in December 2020, is most likely related to her UTI, dehydration, polypharmacy treatment, depression anxiety, Lexapro 10 mg daily has been helpful.  We personally reviewed MRI of cervical spine on April 29, 2019: Level degenerative changes, most severe at C5-6, with rightward disc protrusion, borderline mild spinal stenosis, no cord signal changes, severe right  foraminal stenosis,   REVIEW OF SYSTEMS: Full 14 system review of systems performed and notable only for as above All other review of systems were negative.  ALLERGIES: Allergies  Allergen Reactions  . Ativan [Lorazepam]     confusion    HOME MEDICATIONS: Current Outpatient Medications  Medication Sig Dispense Refill  . aspirin EC 81 MG tablet Take 81 mg by mouth daily.    . diazepam (VALIUM) 5 MG  tablet Take 5 mg by mouth every 6 (six) hours as needed.     . diphenhydrAMINE (BENADRYL) 25 MG tablet Take 12.5 mg by mouth every 8 (eight) hours as needed for itching (both inner arms).    Marland Kitchen escitalopram (LEXAPRO) 10 MG tablet Take 10 mg by mouth daily.    Marland Kitchen lamoTRIgine (LAMICTAL) 200 MG tablet Take 200 mg by mouth at bedtime.    . memantine (NAMENDA) 10 MG tablet Take 1 tablet (10 mg total) by mouth 2 (two) times daily. 180 tablet 3  . polyethylene glycol (MIRALAX / GLYCOLAX) 17 g packet Take 17 g by mouth daily as needed for mild constipation.    . triamcinolone cream (KENALOG) 0.1 % Apply 1 application topically 2 (two) times daily as needed (rash).     No current facility-administered medications for this visit.    PAST MEDICAL HISTORY: Past Medical History:  Diagnosis Date  . Memory loss   . Seizure (HCC)     PAST SURGICAL HISTORY: Past Surgical History:  Procedure Laterality Date  . APPENDECTOMY    . TUBAL LIGATION      FAMILY HISTORY: Family History  Problem Relation Age of Onset  . Pneumonia Mother   . Heart attack Maternal Grandmother   . Stroke Maternal Grandmother   . Suicidality Brother   . Aneurysm Maternal Aunt   . Breast cancer Sister        04/2015  Stage I    SOCIAL HISTORY: Social History   Socioeconomic History  . Marital status: Widowed    Spouse name: Not on file  . Number of children: 0  . Years of education: college  . Highest education level: Not on file  Occupational History    Employer: RETIRED    Comment: retired  Tobacco Use  . Smoking status: Never Smoker  . Smokeless tobacco: Never Used  Substance and Sexual Activity  . Alcohol use: Yes    Alcohol/week: 1.0 standard drinks    Types: 1 Glasses of wine per week  . Drug use: No  . Sexual activity: Not on file  Other Topics Concern  . Not on file  Social History Narrative   Patient lives at home alone and she is widowed. Retired.   College education    Caffeine one cup daily.           Social Determinants of Health   Financial Resource Strain:   . Difficulty of Paying Living Expenses: Not on file  Food Insecurity:   . Worried About Programme researcher, broadcasting/film/video in the Last Year: Not on file  . Ran Out of Food in the Last Year: Not on file  Transportation Needs:   . Lack of Transportation (Medical): Not on file  . Lack of Transportation (Non-Medical): Not on file  Physical Activity:   . Days of Exercise per Week: Not on file  . Minutes of Exercise per Session: Not on file  Stress:   . Feeling of Stress : Not on file  Social Connections:   . Frequency  of Communication with Friends and Family: Not on file  . Frequency of Social Gatherings with Friends and Family: Not on file  . Attends Religious Services: Not on file  . Active Member of Clubs or Organizations: Not on file  . Attends Banker Meetings: Not on file  . Marital Status: Not on file  Intimate Partner Violence:   . Fear of Current or Ex-Partner: Not on file  . Emotionally Abused: Not on file  . Physically Abused: Not on file  . Sexually Abused: Not on file     PHYSICAL EXAM   Vitals:   05/14/19 1512  BP: (!) 148/72  Pulse: 72  Temp: (!) 97 F (36.1 C)  Weight: 90 lb (40.8 kg)  Height: 5' (1.524 m)    Not recorded      Body mass index is 17.58 kg/m.  PHYSICAL EXAMNIATION:  Gen: NAD, conversant, well nourised, well groomed                     Cardiovascular: Regular rate rhythm, no peripheral edema, warm, nontender. Eyes: Conjunctivae clear without exudates or hemorrhage Neck: Supple, no carotid bruits. Pulmonary: Clear to auscultation bilaterally   NEUROLOGICAL EXAM: She is cheerful, cooperative,  MMSE - Mini Mental State Exam 04/01/2019 10/31/2018 07/05/2017  Orientation to time 3 5 5   Orientation to Place 5 5 5   Registration 3 3 3   Attention/ Calculation 5 5 5   Recall 2 2 3   Language- name 2 objects 2 2 2   Language- repeat 1 1 1   Language- follow 3 step command 3 3  3   Language- read & follow direction 1 1 1   Write a sentence 1 1 1   Copy design 0 1 1  Total score 26 29 30   Animal naming 7  CRANIAL NERVES: CN II: Visual fields are full to confrontation.   Pupils are round equal and briskly reactive to light. CN III, IV, VI: extraocular movement are normal. No ptosis. CN V: Facial sensation is intact to pinprick in all 3 divisions bilaterally. Corneal responses are intact.  CN VII: Face is symmetric  CN VIII: Hearing is normal to casual conversation CN IX, X: Palate elevates symmetrically. Phonation is normal. CN XI: Head turning and shoulder shrug are intact   MOTOR: There is no pronator drift of out-stretched arms. Muscle bulk and tone are normal. Muscle strength is normal.  REFLEXES: Reflexes are 2+ and symmetric at the biceps, triceps, 3/3 knees, and ankles. Plantar responses are flexor.  SENSORY: Intact to light touch, pinprick, positional sensation and vibratory sensation are intact in fingers and toes.  COORDINATION: Rapid alternating movements and fine finger movements are intact. There is no dysmetria on finger-to-nose and heel-knee-shin.    GAIT/STANCE: Able to get up from seated position arms crossed, steady,   DIAGNOSTIC DATA (LABS, IMAGING, TESTING) - I reviewed patient records, labs, notes, testing and imaging myself where available.   ASSESSMENT AND PLAN  Gracie Pano Friedhoff is a 74 y.o. female   Complex partial seizure with secondary generalization, last reported seizure was in 2002, Polypharmacy treatment, Depression Mild cognitive impairment  Her confusion, gait abnormality has much improved with rehabilitation,  It is most likely related to her depression, anxiety, dehydration, UTI, polypharmacy treatment,  Keep current dose of lamotrigine ER 200 mg every night, if she continues to do well, may consider even lower dose such as 150 mg every night.     Levert Feinstein, M.D. Ph.D.  [facility]  [address] [place], [place] [address] Ph: [phone] Fax: [phone]  CC: Referring Provider

## 2019-05-17 ENCOUNTER — Ambulatory Visit: Payer: PPO | Admitting: Neurology

## 2019-05-17 DIAGNOSIS — R569 Unspecified convulsions: Secondary | ICD-10-CM | POA: Diagnosis not present

## 2019-05-17 DIAGNOSIS — Z789 Other specified health status: Secondary | ICD-10-CM | POA: Diagnosis not present

## 2019-05-17 DIAGNOSIS — Z681 Body mass index (BMI) 19 or less, adult: Secondary | ICD-10-CM | POA: Diagnosis not present

## 2019-05-17 DIAGNOSIS — Z299 Encounter for prophylactic measures, unspecified: Secondary | ICD-10-CM | POA: Diagnosis not present

## 2019-05-17 DIAGNOSIS — G309 Alzheimer's disease, unspecified: Secondary | ICD-10-CM | POA: Diagnosis not present

## 2019-05-17 DIAGNOSIS — I10 Essential (primary) hypertension: Secondary | ICD-10-CM | POA: Diagnosis not present

## 2019-05-17 DIAGNOSIS — F0280 Dementia in other diseases classified elsewhere without behavioral disturbance: Secondary | ICD-10-CM | POA: Diagnosis not present

## 2019-05-22 DIAGNOSIS — R300 Dysuria: Secondary | ICD-10-CM | POA: Diagnosis not present

## 2019-05-23 DIAGNOSIS — M4696 Unspecified inflammatory spondylopathy, lumbar region: Secondary | ICD-10-CM | POA: Diagnosis not present

## 2019-05-23 DIAGNOSIS — F322 Major depressive disorder, single episode, severe without psychotic features: Secondary | ICD-10-CM | POA: Diagnosis not present

## 2019-05-23 DIAGNOSIS — E440 Moderate protein-calorie malnutrition: Secondary | ICD-10-CM | POA: Diagnosis not present

## 2019-05-23 DIAGNOSIS — N390 Urinary tract infection, site not specified: Secondary | ICD-10-CM | POA: Diagnosis not present

## 2019-05-23 DIAGNOSIS — Z299 Encounter for prophylactic measures, unspecified: Secondary | ICD-10-CM | POA: Diagnosis not present

## 2019-06-14 DIAGNOSIS — G309 Alzheimer's disease, unspecified: Secondary | ICD-10-CM | POA: Diagnosis not present

## 2019-06-14 DIAGNOSIS — E440 Moderate protein-calorie malnutrition: Secondary | ICD-10-CM | POA: Diagnosis not present

## 2019-06-14 DIAGNOSIS — Z299 Encounter for prophylactic measures, unspecified: Secondary | ICD-10-CM | POA: Diagnosis not present

## 2019-06-14 DIAGNOSIS — F0280 Dementia in other diseases classified elsewhere without behavioral disturbance: Secondary | ICD-10-CM | POA: Diagnosis not present

## 2019-06-14 DIAGNOSIS — I10 Essential (primary) hypertension: Secondary | ICD-10-CM | POA: Diagnosis not present

## 2019-06-14 DIAGNOSIS — R350 Frequency of micturition: Secondary | ICD-10-CM | POA: Diagnosis not present

## 2019-06-20 DIAGNOSIS — Z299 Encounter for prophylactic measures, unspecified: Secondary | ICD-10-CM | POA: Diagnosis not present

## 2019-06-20 DIAGNOSIS — R350 Frequency of micturition: Secondary | ICD-10-CM | POA: Diagnosis not present

## 2019-06-20 DIAGNOSIS — E440 Moderate protein-calorie malnutrition: Secondary | ICD-10-CM | POA: Diagnosis not present

## 2019-06-20 DIAGNOSIS — F322 Major depressive disorder, single episode, severe without psychotic features: Secondary | ICD-10-CM | POA: Diagnosis not present

## 2019-08-04 DIAGNOSIS — F329 Major depressive disorder, single episode, unspecified: Secondary | ICD-10-CM | POA: Diagnosis not present

## 2019-08-04 DIAGNOSIS — E7800 Pure hypercholesterolemia, unspecified: Secondary | ICD-10-CM | POA: Diagnosis not present

## 2019-08-04 DIAGNOSIS — I10 Essential (primary) hypertension: Secondary | ICD-10-CM | POA: Diagnosis not present

## 2019-08-04 DIAGNOSIS — F0390 Unspecified dementia without behavioral disturbance: Secondary | ICD-10-CM | POA: Diagnosis not present

## 2019-09-18 DIAGNOSIS — L03116 Cellulitis of left lower limb: Secondary | ICD-10-CM | POA: Diagnosis not present

## 2019-09-18 DIAGNOSIS — I10 Essential (primary) hypertension: Secondary | ICD-10-CM | POA: Diagnosis not present

## 2019-09-18 DIAGNOSIS — T148XXA Other injury of unspecified body region, initial encounter: Secondary | ICD-10-CM | POA: Diagnosis not present

## 2019-09-18 DIAGNOSIS — Z299 Encounter for prophylactic measures, unspecified: Secondary | ICD-10-CM | POA: Diagnosis not present

## 2019-09-30 DIAGNOSIS — Z789 Other specified health status: Secondary | ICD-10-CM | POA: Diagnosis not present

## 2019-09-30 DIAGNOSIS — Z1331 Encounter for screening for depression: Secondary | ICD-10-CM | POA: Diagnosis not present

## 2019-09-30 DIAGNOSIS — R5383 Other fatigue: Secondary | ICD-10-CM | POA: Diagnosis not present

## 2019-09-30 DIAGNOSIS — I10 Essential (primary) hypertension: Secondary | ICD-10-CM | POA: Diagnosis not present

## 2019-09-30 DIAGNOSIS — F322 Major depressive disorder, single episode, severe without psychotic features: Secondary | ICD-10-CM | POA: Diagnosis not present

## 2019-09-30 DIAGNOSIS — E559 Vitamin D deficiency, unspecified: Secondary | ICD-10-CM | POA: Diagnosis not present

## 2019-09-30 DIAGNOSIS — Z299 Encounter for prophylactic measures, unspecified: Secondary | ICD-10-CM | POA: Diagnosis not present

## 2019-10-07 DIAGNOSIS — I10 Essential (primary) hypertension: Secondary | ICD-10-CM | POA: Diagnosis not present

## 2019-10-07 DIAGNOSIS — F329 Major depressive disorder, single episode, unspecified: Secondary | ICD-10-CM | POA: Diagnosis not present

## 2019-10-07 DIAGNOSIS — F0390 Unspecified dementia without behavioral disturbance: Secondary | ICD-10-CM | POA: Diagnosis not present

## 2019-10-07 DIAGNOSIS — E7800 Pure hypercholesterolemia, unspecified: Secondary | ICD-10-CM | POA: Diagnosis not present

## 2019-10-14 DIAGNOSIS — Z299 Encounter for prophylactic measures, unspecified: Secondary | ICD-10-CM | POA: Diagnosis not present

## 2019-10-14 DIAGNOSIS — F322 Major depressive disorder, single episode, severe without psychotic features: Secondary | ICD-10-CM | POA: Diagnosis not present

## 2019-10-14 DIAGNOSIS — D72829 Elevated white blood cell count, unspecified: Secondary | ICD-10-CM | POA: Diagnosis not present

## 2019-10-14 DIAGNOSIS — G309 Alzheimer's disease, unspecified: Secondary | ICD-10-CM | POA: Diagnosis not present

## 2019-10-14 DIAGNOSIS — E440 Moderate protein-calorie malnutrition: Secondary | ICD-10-CM | POA: Diagnosis not present

## 2019-10-14 DIAGNOSIS — I10 Essential (primary) hypertension: Secondary | ICD-10-CM | POA: Diagnosis not present

## 2019-11-14 ENCOUNTER — Ambulatory Visit: Payer: PPO | Admitting: Neurology

## 2019-11-14 DIAGNOSIS — E7800 Pure hypercholesterolemia, unspecified: Secondary | ICD-10-CM | POA: Diagnosis not present

## 2019-11-14 DIAGNOSIS — F322 Major depressive disorder, single episode, severe without psychotic features: Secondary | ICD-10-CM | POA: Diagnosis not present

## 2019-11-14 DIAGNOSIS — Z299 Encounter for prophylactic measures, unspecified: Secondary | ICD-10-CM | POA: Diagnosis not present

## 2019-11-14 DIAGNOSIS — I10 Essential (primary) hypertension: Secondary | ICD-10-CM | POA: Diagnosis not present

## 2019-11-14 DIAGNOSIS — E440 Moderate protein-calorie malnutrition: Secondary | ICD-10-CM | POA: Diagnosis not present

## 2019-11-30 DIAGNOSIS — G319 Degenerative disease of nervous system, unspecified: Secondary | ICD-10-CM | POA: Diagnosis not present

## 2019-11-30 DIAGNOSIS — G40409 Other generalized epilepsy and epileptic syndromes, not intractable, without status epilepticus: Secondary | ICD-10-CM | POA: Diagnosis not present

## 2019-11-30 DIAGNOSIS — E785 Hyperlipidemia, unspecified: Secondary | ICD-10-CM | POA: Diagnosis not present

## 2019-11-30 DIAGNOSIS — R569 Unspecified convulsions: Secondary | ICD-10-CM | POA: Diagnosis not present

## 2019-11-30 DIAGNOSIS — Z20822 Contact with and (suspected) exposure to covid-19: Secondary | ICD-10-CM | POA: Diagnosis not present

## 2019-12-02 ENCOUNTER — Other Ambulatory Visit: Payer: Self-pay | Admitting: *Deleted

## 2019-12-02 ENCOUNTER — Telehealth: Payer: Self-pay | Admitting: Neurology

## 2019-12-02 DIAGNOSIS — R569 Unspecified convulsions: Secondary | ICD-10-CM

## 2019-12-02 MED ORDER — LAMOTRIGINE ER 250 MG PO TB24: 250.0000 mg | ORAL_TABLET | Freq: Every day | ORAL | 1 refills | Status: DC

## 2019-12-02 NOTE — Telephone Encounter (Signed)
I spoke to the patient and her sister. Reports having a break through seizure on 11/30/19. She was seen in ED at [facility] where she provided them with incorrect information on her therapy (reported Keppra). She is currently taking lamotrigine ER 200mg  at bedtime. No other medications for seizures. Per vo by Dr. , increase lamotrigine ER to 250mg  at bedtime, check lab level and come in for sooner appt. New rx sent to pharmacy, appt moved to 12/04/19, lab order placed (pt does not have transportation prior to her appt on 12/04/19).  I also called [facility] to void the other lamotrigine's refill on file in order to prevent confusion.

## 2019-12-02 NOTE — Addendum Note (Signed)
Addended by: Lindell Spar C on: 12/02/2019 10:35 AM   Modules accepted: Orders

## 2019-12-02 NOTE — Addendum Note (Signed)
Addended by: Lindell Spar C on: 12/02/2019 10:51 AM   Modules accepted: Orders

## 2019-12-02 NOTE — Telephone Encounter (Signed)
Tudor,[name](sister) has called to report pt had a break thru seizure over the weekend.  Sister is aware of the upcoming appointment next month.  Sister is asking for a call from RN to discuss medication concern.

## 2019-12-03 NOTE — Progress Notes (Signed)
PATIENT: Sheri Valencia DOB: 1945-04-30  REASON FOR VISIT: follow up HISTORY FROM: patient  HISTORY OF PRESENT ILLNESS: Today 12/04/19  HISTORY  Sheri Valencia is a 74 years old right-handed Caucasian female,followed for complex partial seizure with secondary generalization. She has a history of complex partial seizure with secondary generalization since age 16, presented with rising sensation, then passed out followed by generalized motor seizure, last seizure was in 2002, she was treated with Lamictal 200 mg twice a day, continue to have recurrent seizure, Keppra 500 mg 2 tablets twice a day was added on, no recurrent seizure since. 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 Other levels alsodemonstrate significant stenosis.. She denies significant neck pain She has mild unsteady gait, always contributed to her left hip problem,  She was evaluated recently  by Dr. Channing Mutters, neurosurgeon, she follows up yearly   She returns for reevaluation and refills. She is on Aricept by PCP Dr. [name].   UPDATE Jul 05 2017:  I was able to review the letter from her  Sister Sheri Valencia, she is accompanied by her friend Sheri Valencia at today's clinical visit,  "From her sister's letter: [name] continue has memory problem, easily gets confused, gets frustrated easily, but deny her problem, there was also description of paranoid, emotional incontinence, forgetful, poor decision-making process for her health, irrational spell, recent  purchase of a new car, but difficult to operating her new vehicle, impulsive action, got lost while driving, difficulty working with her computer, loose piece constantly, difficulty managing her bills, ongoing since 2017, progressively getting worse"  Sheri Valencia drove her here today, she has lived [place] for 50 years, 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   She likes to walk, reading.  She lives in a codominum.   She has not have seizure for few years. 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  UPDATE October 26 2017: She was able to taper off Keppra 500 mg twice daily, only on lamotrigine 200 mg twice a day, doing better, along with Namenda 10 mg twice a day, her memory seems to improve will stabilize, care of her elderly aunt,  We personally reviewed MRI of the brain in May 2019, mild generalized atrophy no acute abnormality,  EEG was normal   Laboratory evaluation in May 2019 showed normal TSH, B12, RPR, CBC, CMP showed mild elevated 1.25, with GFR of 44, Keppra level was 66, lamotrigine level was 22, she has no signs of toxicity, the level was not trough level  Virtual Visit via Video on Jul 11, 2018  She is with her sister Sheri Valencia at visit.  She had no recurrent seizure, taking lamotrigine 200 mg twice a day, Keppra 500  mg twice a day, he complains of increased confusion, sometimes unsteady gait, fell few times, sister also concerned about worsening memory loss, had few incident of mis- handling her debit card,  Previously we have tried to taper off Keppra 500 mg 2 tablets twice a day, because her underweight, has not had recurrent seizure for many years, but she is concerned about potential recurrent seizure, put herself back on Keppra 1 tablets twice a day,  Laboratory evaluations on Jul 05, 2017, normal TSH 1.9, lamotrigine 22.7, Keppra 66.5, normal B12, CBC hemoglobin of 11.1, creatinine of 1.25, potassium of 5.4  Update October 31, 2018: She is with her sister Sheri Valencia at today's visit, she continue to take lamotrigine 200 mg twice a day, has no recurrent seizure, she has worsening memory loss, excessive daytime sleepiness, fatigue, continue have depression, taking Zoloft 100 mg daily, she complains of change of taste, has decreased appetite, with some weight loss,  Laboratory evaluations on Jul 12, 2018 showed lamotrigine level was 69,  UPDATE Apr 05 2019: She is accompanied by her Sister Sheri Valencia at today's clinical visit, before Christmas, she complains of few days history of poor appetite, frequent diarrhea, on March 03, 2019, she woke up on the floor, has difficulty getting up from the floor, had a transient loss of consciousness," but felt like it is not a seizure", she called ambulance, was brought to the emergency room at [facility], lamotrigine level was 30, I personally reviewed CT head without contrast showed no acute findings UA showed evidence of UTI, her symptoms improved with hydration,  However, she remained confused, weak, difficulty walking was admitted to [facility] on March 08, 2019, personally reviewed record, CT head showed no acute abnormality, CT of cervical spine showed multilevel degenerative changes, most severe at C5-6, C6 and 7,  She was treated for UTI, planning on to take  Macrobid 100 mg daily for 1 month, there was no recurrent seizure activity, she is back on her home regimen of lamotrigine ER 200 mg 2 tablets every night  She does complains of low back pain, continue to feel generalized weakness, gait abnormality, "I have to think about how to walk my leg", also worsening urinary urgency, incontinence  Laboratory evaluation showed mild anemia hemoglobin of 10.8 CMP showed mildly low potassium 3.4, decreased albumin 3.4  She now lives with her sister temporarily, planning on for short-term assisted living placement due to her continued gait abnormality, worsening confusion,   UPDATE May 14 2019: She is with her sister at today's clinical visit, she is currently lives at at [place], she had a significant improvement, no gait abnormality, no recurrent seizure, is planning on going home soon.  Acute onset of confusion in December 2020, is most likely related to her UTI, dehydration, polypharmacy treatment, depression anxiety, Lexapro 10 mg daily has been helpful.  We personally reviewed MRI of cervical spine on April 29, 2019: Level degenerative changes, most severe at C5-6, with rightward disc protrusion, borderline mild spinal stenosis, no cord signal changes, severe right foraminal stenosis,  Update December 04, 2019 SS: Reportedly had breakthrough seizure on 11/30/19, was in the car with her sister, became unresponsive, her sister reported grand mal seizure, she drove her to ED at [facility] was noted to be actively seizing tonic-clonic activity upper and lower extremities with right eye gazing to the  right, on lamotrigine ER 200 mg at bedtime, we have increased to 250 mg at bedtime since.  In the ER, was given IV Keppra (she incorrectly reported on Keppra), CT head was unremarkable, labs showed WBC 16.3, creatinine 1.36, no UTI.  Earlier this year was taking lamotrigine ER 400 mg daily, had side effect, elevated blood level, dose was subsequently reduced,  patient had lost weight, down to 85 pounds, is back up to 100 pounds.  No reason for recent seizure, one of more significant seizures of recent.  Lives alone, her sister is close by, she has a lot of friends.  Concern for UTI, triggered previous seizures.  Presents today accompanied by her sister, Sheri Valencia. MMSE 29/30 today. Is feeling overall well, some vertigo yesterday but has been chronic.  REVIEW OF SYSTEMS: Out of a complete 14 system review of symptoms, the patient complains only of the following symptoms, and all other reviewed systems are negative.  Seizure  ALLERGIES: Allergies  Allergen Reactions  . Ativan [Lorazepam]     confusion    HOME MEDICATIONS: Outpatient Medications Prior to Visit  Medication Sig Dispense Refill  . aspirin EC 81 MG tablet Take 81 mg by mouth daily.    . LamoTRIgine 250 MG TB24 24 hour tablet Take 250 mg by mouth at bedtime. 90 tablet 1  . memantine (NAMENDA) 10 MG tablet Take 1 tablet (10 mg total) by mouth 2 (two) times daily. 180 tablet 3  . polyethylene glycol (MIRALAX / GLYCOLAX) 17 g packet Take 17 g by mouth daily as needed for mild constipation.    . triamcinolone cream (KENALOG) 0.1 % Apply 1 application topically 2 (two) times daily as needed (rash).    . diphenhydrAMINE (BENADRYL) 25 MG tablet Take 12.5 mg by mouth every 8 (eight) hours as needed for itching (both inner arms).    Marland Kitchen escitalopram (LEXAPRO) 10 MG tablet Take 10 mg by mouth daily.     No facility-administered medications prior to visit.    PAST MEDICAL HISTORY: Past Medical History:  Diagnosis Date  . Memory loss   . Seizure (HCC)     PAST SURGICAL HISTORY: Past Surgical History:  Procedure Laterality Date  . APPENDECTOMY    . TUBAL LIGATION      FAMILY HISTORY: Family History  Problem Relation Age of Onset  . Pneumonia Mother   . Heart attack Maternal Grandmother   . Stroke Maternal Grandmother   . Suicidality Brother   . Aneurysm Maternal Aunt   . Breast cancer  Sister        04/2015  Stage I    SOCIAL HISTORY: Social History   Socioeconomic History  . Marital status: Widowed    Spouse name: Not on file  . Number of children: 0  . Years of education: college  . Highest education level: Not on file  Occupational History    Employer: RETIRED    Comment: retired  Tobacco Use  . Smoking status: Never Smoker  . Smokeless tobacco: Never Used  Substance and Sexual Activity  . Alcohol use: Yes    Alcohol/week: 1.0 standard drink    Types: 1 Glasses of wine per week  . Drug use: No  . Sexual activity: Not on file  Other Topics Concern  . Not on file  Social History Narrative   Patient lives at home alone and she is widowed. Retired.   College education    Caffeine one cup daily.  Social Determinants of Health   Financial Resource Strain:   . Difficulty of Paying Living Expenses: Not on file  Food Insecurity:   . Worried About Programme researcher, broadcasting/film/video in the Last Year: Not on file  . Ran Out of Food in the Last Year: Not on file  Transportation Needs:   . Lack of Transportation (Medical): Not on file  . Lack of Transportation (Non-Medical): Not on file  Physical Activity:   . Days of Exercise per Week: Not on file  . Minutes of Exercise per Session: Not on file  Stress:   . Feeling of Stress : Not on file  Social Connections:   . Frequency of Communication with Friends and Family: Not on file  . Frequency of Social Gatherings with Friends and Family: Not on file  . Attends Religious Services: Not on file  . Active Member of Clubs or Organizations: Not on file  . Attends Banker Meetings: Not on file  . Marital Status: Not on file  Intimate Partner Violence:   . Fear of Current or Ex-Partner: Not on file  . Emotionally Abused: Not on file  . Physically Abused: Not on file  . Sexually Abused: Not on file   PHYSICAL EXAM  Vitals:   12/04/19 0809  BP: 132/80  Pulse: 76  Weight: 100 lb 12.8 oz (45.7 kg)   Height: 5' (1.524 m)   Body mass index is 19.69 kg/m.  Generalized: Well developed, in no acute distress  MMSE - Mini Mental State Exam 12/04/2019 04/01/2019 10/31/2018  Orientation to time 5 3 5   Orientation to Place 5 5 5   Registration 3 3 3   Attention/ Calculation 5 5 5   Recall 3 2 2   Language- name 2 objects 2 2 2   Language- repeat 1 1 1   Language- follow 3 step command 3 3 3   Language- read & follow direction 1 1 1   Write a sentence 1 1 1   Copy design 0 0 1  Total score 29 26 29     Neurological examination  Mentation: Alert oriented to time, place, history taking. Follows all commands speech and language fluent Cranial nerve II-XII: Pupils were equal round reactive to light. Extraocular movements were full, visual field were full on confrontational test. Facial sensation and strength were normal.  Head turning and shoulder shrug  were normal and symmetric. Motor: The motor testing reveals 5 over 5 strength of all 4 extremities. Good symmetric motor tone is noted throughout.  Sensory: Sensory testing is intact to soft touch on all 4 extremities. No evidence of extinction is noted.  Coordination: Cerebellar testing reveals good finger-nose-finger and heel-to-shin bilaterally.  Gait and station: Gait is normal, no assistive device. Reflexes: Deep tendon reflexes are symmetric but increased throughout.  DIAGNOSTIC DATA (LABS, IMAGING, TESTING) - I reviewed patient records, labs, notes, testing and imaging myself where available.  Lab Results  Component Value Date   WBC 12.4 (H) 03/03/2019   HGB 11.3 (L) 03/03/2019   HCT 35.5 (L) 03/03/2019   MCV 88.8 03/03/2019   PLT 356 03/03/2019      Component Value Date/Time   NA 137 03/03/2019 0916   NA 139 07/12/2018 1153   K 3.4 (L) 03/03/2019 0916   CL 102 03/03/2019 0916   CO2 25 03/03/2019 0916   GLUCOSE 122 (H) 03/03/2019 0916   BUN 9 03/03/2019 0916   BUN 14 07/12/2018 1153   CREATININE 0.88 03/03/2019 0916   CALCIUM  8.8 (L) 03/03/2019  0916   PROT 6.7 03/03/2019 0916   PROT 6.7 07/12/2018 1153   ALBUMIN 3.4 (L) 03/03/2019 0916   ALBUMIN 4.0 07/12/2018 1153   AST 27 03/03/2019 0916   ALT 19 03/03/2019 0916   ALKPHOS 92 03/03/2019 0916   BILITOT 0.3 03/03/2019 0916   BILITOT <0.2 07/12/2018 1153   GFRNONAA >60 03/03/2019 0916   GFRAA >60 03/03/2019 0916   No results found for: CHOL, HDL, LDLCALC, LDLDIRECT, TRIG, CHOLHDL No results found for: WUJW1X Lab Results  Component Value Date   VITAMINB12 865 07/05/2017   Lab Results  Component Value Date   TSH 1.900 07/05/2017      ASSESSMENT AND PLAN 74 y.o. year old female  has a past medical history of Memory loss and Seizure (HCC). here with:  1.  Complex partial seizure with secondary generalization, recent seizure 11/30/19 2.  Polypharmacy treatment 3.  Depression 4.  Mild cognitive impairment -Recurrent seizure 11/30/19 on Lamictal ER 200 mg QHS went to [facility] ER -Check routine blood work, Lamictal level, urinalysis (previous seizures triggered by UTI, UTI negative in ER, but concerns collected in bedpan) -Continue higher dose Lamictal ER 250 mg at bedtime -MMSE looks good at 29/30, reportedly memory improving -No driving until seizure-free for 6 months -Follow-up in 6 months or sooner if needed, call for seizure activity  I spent 30 minutes of face-to-face and non-face-to-face time with patient.  This included previsit chart review, lab review, study review, order entry, electronic health record documentation, patient education.  Margie Ege, AGNP-C, DNP 12/04/2019, 8:43 AM [facility] [address], [address] [place], [place] [address] [phone]

## 2019-12-04 ENCOUNTER — Ambulatory Visit: Payer: PPO | Admitting: Neurology

## 2019-12-04 ENCOUNTER — Other Ambulatory Visit: Payer: Self-pay

## 2019-12-04 ENCOUNTER — Encounter: Payer: Self-pay | Admitting: Neurology

## 2019-12-04 VITALS — BP 132/80 | HR 76 | Ht 60.0 in | Wt 100.8 lb

## 2019-12-04 DIAGNOSIS — R569 Unspecified convulsions: Secondary | ICD-10-CM

## 2019-12-04 DIAGNOSIS — G3184 Mild cognitive impairment, so stated: Secondary | ICD-10-CM

## 2019-12-04 MED ORDER — MEMANTINE HCL 10 MG PO TABS: 10.0000 mg | ORAL_TABLET | Freq: Two times a day (BID) | ORAL | 3 refills | Status: DC

## 2019-12-04 NOTE — Patient Instructions (Addendum)
Continue current dosing of Lamictal  Check blood work today and urine  No driving until seizure free for 6 months Return in 6 months or sooner if needed

## 2019-12-08 LAB — UA/M W/RFLX CULTURE, ROUTINE
Bilirubin, UA: NEGATIVE
Glucose, UA: NEGATIVE
Ketones, UA: NEGATIVE
Nitrite, UA: NEGATIVE
Protein,UA: NEGATIVE
RBC, UA: NEGATIVE
Specific Gravity, UA: 1.01 (ref 1.005–1.030)
Urobilinogen, Ur: 0.2 mg/dL (ref 0.2–1.0)
pH, UA: 6.5 (ref 5.0–7.5)

## 2019-12-08 LAB — COMPREHENSIVE METABOLIC PANEL
ALT: 23 IU/L (ref 0–32)
AST: 33 IU/L (ref 0–40)
Albumin/Globulin Ratio: 1.5 (ref 1.2–2.2)
Albumin: 4.3 g/dL (ref 3.7–4.7)
Alkaline Phosphatase: 123 IU/L — ABNORMAL HIGH (ref 44–121)
BUN/Creatinine Ratio: 11 — ABNORMAL LOW (ref 12–28)
BUN: 12 mg/dL (ref 8–27)
Bilirubin Total: 0.2 mg/dL (ref 0.0–1.2)
CO2: 24 mmol/L (ref 20–29)
Calcium: 9.6 mg/dL (ref 8.7–10.3)
Chloride: 101 mmol/L (ref 96–106)
Creatinine, Ser: 1.05 mg/dL — ABNORMAL HIGH (ref 0.57–1.00)
GFR calc Af Amer: 61 mL/min/{1.73_m2} (ref >59)
GFR calc non Af Amer: 53 mL/min/{1.73_m2} — ABNORMAL LOW (ref >59)
Globulin, Total: 2.8 g/dL (ref 1.5–4.5)
Glucose: 94 mg/dL (ref 65–99)
Potassium: 5.3 mmol/L — ABNORMAL HIGH (ref 3.5–5.2)
Sodium: 137 mmol/L (ref 134–144)
Total Protein: 7.1 g/dL (ref 6.0–8.5)

## 2019-12-08 LAB — CBC WITH DIFFERENTIAL/PLATELET
Basophils Absolute: 0.1 10*3/uL (ref 0.0–0.2)
Basos: 1 %
EOS (ABSOLUTE): 0.1 10*3/uL (ref 0.0–0.4)
Eos: 1 %
Hematocrit: 34.5 % (ref 34.0–46.6)
Hemoglobin: 11.4 g/dL (ref 11.1–15.9)
Immature Grans (Abs): 0.1 10*3/uL (ref 0.0–0.1)
Immature Granulocytes: 1 %
Lymphocytes Absolute: 1.3 10*3/uL (ref 0.7–3.1)
Lymphs: 14 %
MCH: 28.6 pg (ref 26.6–33.0)
MCHC: 33 g/dL (ref 31.5–35.7)
MCV: 87 fL (ref 79–97)
Monocytes Absolute: 0.8 10*3/uL (ref 0.1–0.9)
Monocytes: 9 %
Neutrophils Absolute: 6.7 10*3/uL (ref 1.4–7.0)
Neutrophils: 74 %
Platelets: 307 10*3/uL (ref 150–450)
RBC: 3.99 x10E6/uL (ref 3.77–5.28)
RDW: 12.7 % (ref 11.7–15.4)
WBC: 9.1 10*3/uL (ref 3.4–10.8)

## 2019-12-08 LAB — URINE CULTURE, REFLEX

## 2019-12-08 LAB — MICROSCOPIC EXAMINATION
Bacteria, UA: NONE SEEN
Casts: NONE SEEN /lpf
WBC, UA: NONE SEEN /hpf (ref 0–5)

## 2019-12-08 LAB — LAMOTRIGINE LEVEL: Lamotrigine Lvl: 17.6 ug/mL (ref 2.0–20.0)

## 2019-12-09 NOTE — Progress Notes (Signed)
Pt verified by name and DOB,  results to given to sister, per provider,  voiced understanding all question answered.  Results faxed to PCP

## 2019-12-16 DIAGNOSIS — R569 Unspecified convulsions: Secondary | ICD-10-CM | POA: Diagnosis not present

## 2019-12-16 DIAGNOSIS — M4696 Unspecified inflammatory spondylopathy, lumbar region: Secondary | ICD-10-CM | POA: Diagnosis not present

## 2019-12-16 DIAGNOSIS — I10 Essential (primary) hypertension: Secondary | ICD-10-CM | POA: Diagnosis not present

## 2019-12-16 DIAGNOSIS — Z299 Encounter for prophylactic measures, unspecified: Secondary | ICD-10-CM | POA: Diagnosis not present

## 2019-12-16 DIAGNOSIS — B952 Enterococcus as the cause of diseases classified elsewhere: Secondary | ICD-10-CM | POA: Diagnosis not present

## 2019-12-19 ENCOUNTER — Telehealth: Payer: Self-pay | Admitting: *Deleted

## 2019-12-19 NOTE — Telephone Encounter (Signed)
Left voicemail. Pt sister need update on form.

## 2019-12-30 ENCOUNTER — Ambulatory Visit: Payer: PPO | Admitting: Neurology

## 2019-12-30 ENCOUNTER — Telehealth: Payer: Self-pay | Admitting: Neurology

## 2019-12-30 MED ORDER — LEVETIRACETAM 250 MG PO TABS: 250.0000 mg | ORAL_TABLET | Freq: Two times a day (BID) | ORAL | 11 refills | Status: DC

## 2019-12-30 NOTE — Telephone Encounter (Signed)
I called pt and she is concerned that she continues with aura (feelings of nausea, feeling of blacking out) 5-6/ 14 days. She has not seizures, just the feelings like she may have one.  These usually hit about mid day.  She asking if can have more lamictal during the day to Riling off feelings.  Please advise.  I relayed that lamictal is 250mg  24hr   She understands.

## 2019-12-30 NOTE — Addendum Note (Signed)
Addended by: Glean Salvo on: 12/30/2019 04:27 PM   Modules accepted: Orders

## 2019-12-30 NOTE — Telephone Encounter (Signed)
I am inclined to not increase Lamictal further, on Lamictal XR 250 mg daily, recent blood level was 17.6.  I think she was previously on Keppra, was this stopped because of medication intolerance, or due to good seizure control?  If due to the latter, we could reintroduce low-dose even 250 mg twice daily Keppra.

## 2019-12-30 NOTE — Telephone Encounter (Signed)
Pt called, two weeks ago starting having a sensation I may have a seizure. Can you add another Lamotrigine, because of the sensation to have a seizure. Would like a call from the nurse.

## 2019-12-30 NOTE — Telephone Encounter (Signed)
I called pt and relayed that I spoke to SS/NP.  She states that she can  reintroduce keppra, (I see that she 07-11-2018 with Dr. Terrace Arabia pt underweight and had no sz taper off).   She could not recall. I stated she could given her 250mg  po bid and pt was ok with this.  [facility] pharmacy

## 2019-12-30 NOTE — Telephone Encounter (Addendum)
I sent in Keppra 250 mg BID as add on with Lamictal. Watch out for anxiety, irritability that is more than normal.

## 2019-12-31 NOTE — Telephone Encounter (Signed)
I called pt and relayed that prescription was called in for her to her pharmacy.  I relayed that keppra 250mg  po bid to add to lamictal regimen, watch for increased anxiety or irritability. (more than normal).  She verbalized understanding. Sister she said was made aware, as she speaks to her on daily basis.

## 2020-01-03 DIAGNOSIS — E7800 Pure hypercholesterolemia, unspecified: Secondary | ICD-10-CM | POA: Diagnosis not present

## 2020-01-03 DIAGNOSIS — F0390 Unspecified dementia without behavioral disturbance: Secondary | ICD-10-CM | POA: Diagnosis not present

## 2020-01-03 DIAGNOSIS — I10 Essential (primary) hypertension: Secondary | ICD-10-CM | POA: Diagnosis not present

## 2020-01-03 DIAGNOSIS — F329 Major depressive disorder, single episode, unspecified: Secondary | ICD-10-CM | POA: Diagnosis not present

## 2020-01-17 ENCOUNTER — Telehealth: Payer: Self-pay | Admitting: Neurology

## 2020-01-17 NOTE — Telephone Encounter (Signed)
Pt.'s sister [name] is on [place]. She states she left a medical form here about a month ago to be filled out for sister for her insurance for long term care. Please advise.

## 2020-02-04 DIAGNOSIS — F329 Major depressive disorder, single episode, unspecified: Secondary | ICD-10-CM | POA: Diagnosis not present

## 2020-02-04 DIAGNOSIS — E7800 Pure hypercholesterolemia, unspecified: Secondary | ICD-10-CM | POA: Diagnosis not present

## 2020-02-04 DIAGNOSIS — F0390 Unspecified dementia without behavioral disturbance: Secondary | ICD-10-CM | POA: Diagnosis not present

## 2020-02-04 DIAGNOSIS — I10 Essential (primary) hypertension: Secondary | ICD-10-CM | POA: Diagnosis not present

## 2020-02-12 DIAGNOSIS — E7800 Pure hypercholesterolemia, unspecified: Secondary | ICD-10-CM | POA: Diagnosis not present

## 2020-02-12 DIAGNOSIS — Z1331 Encounter for screening for depression: Secondary | ICD-10-CM | POA: Diagnosis not present

## 2020-02-12 DIAGNOSIS — Z7189 Other specified counseling: Secondary | ICD-10-CM | POA: Diagnosis not present

## 2020-02-12 DIAGNOSIS — R5383 Other fatigue: Secondary | ICD-10-CM | POA: Diagnosis not present

## 2020-02-12 DIAGNOSIS — Z299 Encounter for prophylactic measures, unspecified: Secondary | ICD-10-CM | POA: Diagnosis not present

## 2020-02-12 DIAGNOSIS — Z0000 Encounter for general adult medical examination without abnormal findings: Secondary | ICD-10-CM | POA: Diagnosis not present

## 2020-02-12 DIAGNOSIS — Z79899 Other long term (current) drug therapy: Secondary | ICD-10-CM | POA: Diagnosis not present

## 2020-02-12 DIAGNOSIS — Z1339 Encounter for screening examination for other mental health and behavioral disorders: Secondary | ICD-10-CM | POA: Diagnosis not present

## 2020-02-18 ENCOUNTER — Telehealth: Payer: Self-pay | Admitting: Neurology

## 2020-02-18 NOTE — Telephone Encounter (Signed)
Fax confirmation received for Dr. [name] [phone] Nilda Simmer notes January tru 12-04-19 and letter from 05/2019.

## 2020-02-18 NOTE — Telephone Encounter (Signed)
I relayed that if any p/w given you would note in your note.  I think she may mean the pcp.

## 2020-02-18 NOTE — Telephone Encounter (Signed)
I called sister of pt.  Pt asking about form that we do not have relating to LTC insurance.  This was done by pcp, Dr. [name] initially when pt was placed in [place] in April 2021.  After being at [facility] 03-15-19 and then home but not improving thru PT thought would be beneficial for inpt at [facility].  I relayed that I have no form.  Since pcp filled out initially it would fall to them to fill out.  I would fax oour [facility] notes since Jan 2021 and letter done in 05/2019 to assist if needed.  She may fax form to me and I relayed that this may be done depending on provider approval.

## 2020-02-18 NOTE — Telephone Encounter (Signed)
Pt.'s sister [name] is on [place]. She is asking if paperwork was found & does she need to send more as there is a deadline so her sister's assisted living care will be paid for. She asks for a call back.

## 2020-02-20 DIAGNOSIS — I10 Essential (primary) hypertension: Secondary | ICD-10-CM | POA: Diagnosis not present

## 2020-02-20 DIAGNOSIS — F0280 Dementia in other diseases classified elsewhere without behavioral disturbance: Secondary | ICD-10-CM | POA: Diagnosis not present

## 2020-02-20 DIAGNOSIS — Z23 Encounter for immunization: Secondary | ICD-10-CM | POA: Diagnosis not present

## 2020-02-20 DIAGNOSIS — G309 Alzheimer's disease, unspecified: Secondary | ICD-10-CM | POA: Diagnosis not present

## 2020-02-20 DIAGNOSIS — Z299 Encounter for prophylactic measures, unspecified: Secondary | ICD-10-CM | POA: Diagnosis not present

## 2020-02-20 DIAGNOSIS — Z681 Body mass index (BMI) 19 or less, adult: Secondary | ICD-10-CM | POA: Diagnosis not present

## 2020-03-05 DIAGNOSIS — F329 Major depressive disorder, single episode, unspecified: Secondary | ICD-10-CM | POA: Diagnosis not present

## 2020-03-05 DIAGNOSIS — I10 Essential (primary) hypertension: Secondary | ICD-10-CM | POA: Diagnosis not present

## 2020-03-05 DIAGNOSIS — F0390 Unspecified dementia without behavioral disturbance: Secondary | ICD-10-CM | POA: Diagnosis not present

## 2020-03-05 DIAGNOSIS — E7800 Pure hypercholesterolemia, unspecified: Secondary | ICD-10-CM | POA: Diagnosis not present

## 2020-03-16 DIAGNOSIS — E2839 Other primary ovarian failure: Secondary | ICD-10-CM | POA: Diagnosis not present

## 2020-03-16 DIAGNOSIS — M818 Other osteoporosis without current pathological fracture: Secondary | ICD-10-CM | POA: Diagnosis not present

## 2020-05-02 DIAGNOSIS — R29818 Other symptoms and signs involving the nervous system: Secondary | ICD-10-CM | POA: Diagnosis not present

## 2020-05-02 DIAGNOSIS — G40909 Epilepsy, unspecified, not intractable, without status epilepticus: Secondary | ICD-10-CM | POA: Diagnosis not present

## 2020-05-02 DIAGNOSIS — R4182 Altered mental status, unspecified: Secondary | ICD-10-CM | POA: Insufficient documentation

## 2020-05-02 DIAGNOSIS — R29810 Facial weakness: Secondary | ICD-10-CM | POA: Diagnosis not present

## 2020-05-02 DIAGNOSIS — I6501 Occlusion and stenosis of right vertebral artery: Secondary | ICD-10-CM | POA: Diagnosis not present

## 2020-05-02 DIAGNOSIS — Z8679 Personal history of other diseases of the circulatory system: Secondary | ICD-10-CM | POA: Insufficient documentation

## 2020-05-02 DIAGNOSIS — E785 Hyperlipidemia, unspecified: Secondary | ICD-10-CM | POA: Diagnosis not present

## 2020-05-02 DIAGNOSIS — R011 Cardiac murmur, unspecified: Secondary | ICD-10-CM | POA: Diagnosis not present

## 2020-05-02 DIAGNOSIS — R0902 Hypoxemia: Secondary | ICD-10-CM | POA: Diagnosis not present

## 2020-05-02 DIAGNOSIS — N3000 Acute cystitis without hematuria: Secondary | ICD-10-CM | POA: Diagnosis not present

## 2020-05-02 DIAGNOSIS — I10 Essential (primary) hypertension: Secondary | ICD-10-CM | POA: Diagnosis not present

## 2020-05-02 DIAGNOSIS — Z79899 Other long term (current) drug therapy: Secondary | ICD-10-CM | POA: Diagnosis not present

## 2020-05-02 DIAGNOSIS — M79601 Pain in right arm: Secondary | ICD-10-CM | POA: Diagnosis not present

## 2020-05-02 DIAGNOSIS — I6523 Occlusion and stenosis of bilateral carotid arteries: Secondary | ICD-10-CM | POA: Diagnosis not present

## 2020-05-02 DIAGNOSIS — R9431 Abnormal electrocardiogram [ECG] [EKG]: Secondary | ICD-10-CM | POA: Diagnosis not present

## 2020-05-02 DIAGNOSIS — R569 Unspecified convulsions: Secondary | ICD-10-CM | POA: Diagnosis not present

## 2020-05-02 DIAGNOSIS — R414 Neurologic neglect syndrome: Secondary | ICD-10-CM | POA: Diagnosis not present

## 2020-05-02 DIAGNOSIS — Z7982 Long term (current) use of aspirin: Secondary | ICD-10-CM | POA: Diagnosis not present

## 2020-05-02 DIAGNOSIS — R404 Transient alteration of awareness: Secondary | ICD-10-CM | POA: Diagnosis not present

## 2020-05-02 DIAGNOSIS — R4020 Unspecified coma: Secondary | ICD-10-CM | POA: Diagnosis not present

## 2020-05-02 DIAGNOSIS — Z20822 Contact with and (suspected) exposure to covid-19: Secondary | ICD-10-CM | POA: Diagnosis not present

## 2020-05-03 DIAGNOSIS — Z79899 Other long term (current) drug therapy: Secondary | ICD-10-CM | POA: Diagnosis not present

## 2020-05-03 DIAGNOSIS — R748 Abnormal levels of other serum enzymes: Secondary | ICD-10-CM | POA: Diagnosis not present

## 2020-05-03 DIAGNOSIS — R011 Cardiac murmur, unspecified: Secondary | ICD-10-CM | POA: Diagnosis not present

## 2020-05-03 DIAGNOSIS — G40909 Epilepsy, unspecified, not intractable, without status epilepticus: Secondary | ICD-10-CM | POA: Diagnosis not present

## 2020-05-04 ENCOUNTER — Telehealth: Payer: Self-pay | Admitting: Neurology

## 2020-05-04 DIAGNOSIS — I5189 Other ill-defined heart diseases: Secondary | ICD-10-CM | POA: Diagnosis not present

## 2020-05-04 DIAGNOSIS — R4182 Altered mental status, unspecified: Secondary | ICD-10-CM | POA: Diagnosis not present

## 2020-05-04 DIAGNOSIS — N3000 Acute cystitis without hematuria: Secondary | ICD-10-CM | POA: Diagnosis not present

## 2020-05-04 DIAGNOSIS — R29818 Other symptoms and signs involving the nervous system: Secondary | ICD-10-CM | POA: Diagnosis not present

## 2020-05-04 DIAGNOSIS — R569 Unspecified convulsions: Secondary | ICD-10-CM | POA: Diagnosis not present

## 2020-05-04 NOTE — Telephone Encounter (Signed)
Pt's sister, [name] (on [place]) called, Saturday at 6 pm she had a seizure. She was taken by EMS to [facility] in [place]. Would like a call from the nurse.

## 2020-05-04 NOTE — Telephone Encounter (Signed)
I called sister of the patient she stated patient had a seizure was seen at [facility].  They increased her medication to Keppra 500 mg twice a day.  I made appointment for her this Thursday at 915.  Please discuss with patient no driving per [facility] law for 6 months from last seizure.

## 2020-05-06 NOTE — Progress Notes (Signed)
PATIENT: Sheri Valencia DOB: [birth year]  REASON FOR VISIT: follow up HISTORY FROM: patient  HISTORY OF PRESENT ILLNESS: Today 05/07/20  HISTORY  Sheri Valencia is a 75 years old right-handed Caucasian female,followed for complex partial seizure with secondary generalization. 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 Other levels alsodemonstrate significant stenosis.. She denies significant neck pain She has mild unsteady gait, always contributed to her left hip problem,  She was evaluated recently  by Dr. Channing Mutters, neurosurgeon, she follows up yearly   She returns for reevaluation and refills. She is on Aricept by PCP Dr. [name].   UPDATE Jul 05 2017:  I was able to review the letter from her  Sister Sheri Valencia, she is accompanied by her friend Sheri Valencia at today's clinical visit,  "From her sister's letter: [name] continue has memory problem, easily gets confused, gets frustrated easily, but deny her problem, there was also description of paranoid, emotional incontinence, forgetful, poor decision-making process for her health, irrational spell, recent  purchase of a new car, but difficult to operating her new vehicle, impulsive action, got lost while driving, difficulty working with her computer, loose piece constantly, difficulty managing her bills, ongoing since 2017, progressively getting worse"  Sheri Valencia drove her here today, she has lived [place] for 50 years, 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   She likes to walk, reading.  She lives in a codominum.   She has not have seizure for few years. 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  UPDATE October 26 2017: She was able to taper off Keppra 500 mg twice daily, only on lamotrigine 200 mg twice a day, doing better, along with Namenda 10 mg twice a day, her memory seems to improve will stabilize, care of her elderly aunt,  We personally reviewed MRI of the brain in May 2019, mild generalized atrophy no acute abnormality,  EEG was normal   Laboratory evaluation in May 2019 showed normal TSH, B12, RPR, CBC, CMP showed mild elevated 1.25, with GFR of 44, Keppra level was 66, lamotrigine level was 22, she has no signs of toxicity, the level was not trough level  Virtual Visit via Video on Jul 11, 2018  She is with her sister Sheri Valencia at visit.  She had no recurrent seizure, taking lamotrigine 200 mg twice a day, Keppra 500  mg twice a day, he complains of increased confusion, sometimes unsteady gait, fell few times, sister also concerned about worsening memory loss, had few incident of mis- handling her debit card,  Previously we have tried to taper off Keppra 500 mg 2 tablets twice a day, because her underweight, has not had recurrent seizure for many years, but she is concerned about potential recurrent seizure, put herself back on Keppra 1 tablets twice a day,  Laboratory evaluations on Jul 05, 2017, normal TSH 1.9, lamotrigine 22.7, Keppra 66.5, normal B12, CBC hemoglobin of 11.1, creatinine of 1.25, potassium of 5.4  Update October 31, 2018: She is with her sister Sheri Valencia at today's visit, she continue to take lamotrigine 200 mg twice a day, has no recurrent seizure, she has worsening memory loss, excessive daytime sleepiness, fatigue, continue have depression, taking Zoloft 100 mg daily, she complains of change of taste, has decreased appetite, with some weight loss,  Laboratory evaluations on Jul 12, 2018 showed lamotrigine level was 1,  UPDATE Apr 05 2019: She is accompanied by her Sister Sheri Valencia at today's clinical visit, before Christmas, she complains of few days history of poor appetite, frequent diarrhea, on March 03, 2019, she woke up on the floor, has difficulty getting up from the floor, had a transient loss of consciousness," but felt like it is not a seizure", she called ambulance, was brought to the emergency room at [facility], lamotrigine level was 30, I personally reviewed CT head without contrast showed no acute findings UA showed evidence of UTI, her symptoms improved with hydration,  However, she remained confused, weak, difficulty walking was admitted to [facility] on March 08, 2019, personally reviewed record, CT head showed no acute abnormality, CT of cervical spine showed multilevel degenerative changes, most severe at C5-6, C6 and 7,  She was treated for UTI, planning on to take  Macrobid 100 mg daily for 1 month, there was no recurrent seizure activity, she is back on her home regimen of lamotrigine ER 200 mg 2 tablets every night  She does complains of low back pain, continue to feel generalized weakness, gait abnormality, "I have to think about how to walk my leg", also worsening urinary urgency, incontinence  Laboratory evaluation showed mild anemia hemoglobin of 10.8 CMP showed mildly low potassium 3.4, decreased albumin 3.4  She now lives with her sister temporarily, planning on for short-term assisted living placement due to her continued gait abnormality, worsening confusion,   UPDATE May 14 2019: She is with her sister at today's clinical visit, she is currently lives at at [place], she had a significant improvement, no gait abnormality, no recurrent seizure, is planning on going home soon.  Acute onset of confusion in December 2020, is most likely related to her UTI, dehydration, polypharmacy treatment, depression anxiety, Lexapro 10 mg daily has been helpful.  We personally reviewed MRI of cervical spine on April 29, 2019: Level degenerative changes, most severe at C5-6, with rightward disc protrusion, borderline mild spinal stenosis, no cord signal changes, severe right foraminal stenosis,  Update December 04, 2019 SS: Reportedly had breakthrough seizure on 11/30/19, was in the car with her sister, became unresponsive, her sister reported grand mal seizure, she drove her to ED at [facility] was noted to be actively seizing tonic-clonic activity upper and lower extremities with right eye gazing to the  right, on lamotrigine ER 200 mg at bedtime, we have increased to 250 mg at bedtime since.  In the ER, was given IV Keppra (she incorrectly reported on Keppra), CT head was unremarkable, labs showed WBC 16.3, creatinine 1.36, no UTI.  Earlier this year was taking lamotrigine ER 400 mg daily, had side effect, elevated blood level, dose was subsequently reduced,  patient had lost weight, down to 85 pounds, is back up to 100 pounds.  No reason for recent seizure, one of more significant seizures of recent.  Lives alone, her sister is close by, she has a lot of friends.  Concern for UTI, triggered previous seizures.  Presents today accompanied by her sister, Sheri Valencia. MMSE 29/30 today. Is feeling overall well, some vertigo yesterday but has been chronic.  Update May 07, 2020 SS: Was in the ER May 02, 2020 following tonic-clonic seizure activity for 10 minutes, given Versed by EMS. Seizure was witnessed by a friend. She lives alone. CT head showed no acute abnormality.  Lamictal level is not available, Keppra level was 11.3.  Creatinine was 1.46 (recheck 05/01/20 1.18)  CBC unremarkable.  At the time of the seizure was taking Keppra 250 mg BID, Lamictal XR 250 mg daily.  Not treated for UTI, urine showed moderate blood, small leuks, negative nitrites. In ER Keppra was increased to 500 mg twice daily. Other than this seizure, last was in September 2021. Does drive but not right now. No missed doses of medications, no triggers known. When seen in May 2019, no seizure in 15 years, weaned down Keppra (in May 2019 on Keppra 1000 mg BID, Lamictal 200 mg twice daily). Recent seizure, had not eaten much, blood sugar was low reportedly. Since seizure, at baseline, but does feel slightly dizzy. Here today with Sheri Valencia, her sister.  REVIEW OF SYSTEMS: Out of a complete 14 system review of symptoms, the patient complains only of the following symptoms, and all other reviewed systems are negative.  Seizure  ALLERGIES: Allergies  Allergen Reactions  . Ativan [Lorazepam]     confusion    HOME MEDICATIONS: Outpatient Medications Prior to Visit  Medication Sig Dispense Refill  . aspirin EC 81 MG tablet Take 81 mg by mouth daily.    . LamoTRIgine 250 MG TB24 24 hour tablet Take 250 mg by mouth at bedtime. 90 tablet 1  . levETIRAcetam (KEPPRA) 250 MG tablet Take 1 tablet (250 mg  total) by mouth 2 (two) times daily. 60 tablet 11  . memantine (NAMENDA) 10 MG tablet Take 1 tablet (10 mg total) by mouth 2 (two) times daily. 180 tablet 3  . polyethylene glycol (MIRALAX / GLYCOLAX) 17 g packet Take 17 g by mouth daily as needed for mild constipation.    . triamcinolone cream (KENALOG) 0.1 % Apply 1 application topically 2 (two) times daily as needed (rash).     No facility-administered medications prior to visit.    PAST MEDICAL HISTORY: Past Medical History:  Diagnosis Date  . Memory loss   . Seizure (HCC)     PAST SURGICAL HISTORY: Past Surgical History:  Procedure Laterality Date  . APPENDECTOMY    . TUBAL LIGATION      FAMILY HISTORY: Family History  Problem Relation Age of Onset  . Pneumonia Mother   . Heart attack Maternal Grandmother   . Stroke Maternal Grandmother   . Suicidality Brother   . Aneurysm Maternal Aunt   . Breast cancer Sister        04/2015  Stage I    SOCIAL HISTORY: Social History   Socioeconomic History  . Marital status: Widowed    Spouse name: Not on file  . Number of children: 0  . Years of education: college  . Highest education level: Not on file  Occupational History    Employer: RETIRED    Comment: retired  Tobacco Use  . Smoking status: Never Smoker  . Smokeless tobacco: Never Used  Substance and Sexual Activity  . Alcohol use: Yes    Alcohol/week: 1.0 standard drink    Types: 1 Glasses of wine per week  . Drug use: No  . Sexual activity: Not on file  Other Topics Concern  . Not on file  Social History Narrative   Patient lives at home alone and she is widowed. Retired.   College education    Caffeine one cup daily.         Social Determinants of Health   Financial Resource Strain: Not on file  Food Insecurity: Not on file  Transportation Needs: Not on file  Physical Activity: Not on file  Stress: Not on file  Social Connections: Not on file  Intimate Partner Violence: Not on file   PHYSICAL  EXAM  There were no vitals filed for this visit. There is no height or weight on file to calculate BMI.  Generalized: Well developed, in no acute distress  MMSE - Mini Mental State Exam 12/04/2019 04/01/2019 10/31/2018  Orientation to time 5 3 5   Orientation to Place 5 5 5   Registration 3 3 3   Attention/ Calculation 5 5 5   Recall 3 2 2   Language- name 2 objects 2 2 2   Language- repeat 1 1 1   Language- follow 3 step command 3 3 3   Language- read & follow direction 1 1 1   Write a sentence 1 1 1   Copy design 0 0 1  Total score 29 26 29     Neurological examination  Mentation: Alert oriented to time, place, history taking. Follows all commands speech and language fluent Cranial nerve II-XII: Pupils were equal round reactive to light. Extraocular movements were full, visual field were full on confrontational test. Facial sensation and strength were normal.  Head turning and shoulder shrug  were normal and symmetric. Motor: The motor testing reveals 5 over 5 strength of all 4 extremities. Good symmetric motor tone is noted throughout.  Sensory: Sensory testing is intact to soft touch on all 4 extremities. No evidence of extinction is noted.  Coordination: Cerebellar testing reveals good finger-nose-finger and heel-to-shin bilaterally.  Gait and station: Gait is normal, no assistive device. Tandem gait is normal. Romberg is steady.  Reflexes: Deep tendon reflexes are symmetric but increased throughout.  DIAGNOSTIC DATA (LABS, IMAGING, TESTING) - I reviewed patient records, labs, notes, testing and imaging myself where available.  Lab Results  Component Value Date   WBC 9.1 12/04/2019   HGB 11.4 12/04/2019   HCT 34.5 12/04/2019   MCV 87 12/04/2019   PLT 307 12/04/2019      Component Value Date/Time   NA 137 12/04/2019 0849   K 5.3 (H) 12/04/2019 0849   CL 101 12/04/2019 0849   CO2 24 12/04/2019 0849   GLUCOSE 94 12/04/2019 0849   GLUCOSE 122 (H) 03/03/2019 0916   BUN 12  12/04/2019 0849   CREATININE 1.05 (H) 12/04/2019 0849   CALCIUM 9.6 12/04/2019 0849   PROT 7.1 12/04/2019 0849   ALBUMIN 4.3 12/04/2019 0849   AST 33 12/04/2019 0849  ALT 23 12/04/2019 0849   ALKPHOS 123 (H) 12/04/2019 0849   BILITOT 0.2 12/04/2019 0849   GFRNONAA 53 (L) 12/04/2019 0849   GFRAA 61 12/04/2019 0849   No results found for: CHOL, HDL, LDLCALC, LDLDIRECT, TRIG, CHOLHDL No results found for: GEXB2W Lab Results  Component Value Date   VITAMINB12 865 07/05/2017   Lab Results  Component Value Date   TSH 1.900 07/05/2017   ASSESSMENT AND PLAN 75 y.o. year old female  has a past medical history of Memory loss and Seizure (HCC). here with:  1.  Complex partial seizure with secondary generalization, recent seizure 05/02/2020, previous September 2021  2.  Polypharmacy treatment  3.  Depression  4.  Mild cognitive impairment  -Recurrent seizure 05/02/20 on Lamictal ER 250 mg QHS, Keppra 250 mg BID, was transported to the ER, no known triggers for seizure -Check Lamictal level today; on 12/01/19 was 17.6 on Lamictal XR 250 mg daily; Keppra level was 11.3 on 05/02/20 -Increase Keppra 500 mg twice daily, continue Lamictal XR 250 mg daily (when under good seizure control for 15 years on Keppra 1000 mg BID, Lamictal 200 mg BID) -Will order EEG to be done -Have PCP recheck urine, historically UTI has been trigger for seizure, in ER was not treated for UTI, but showed small leuks, moderate blood, negative nitrites  -We have to be careful with Lamictal, has previously been toxic -No driving until seizure-free for 6 months -Follow-up in 6 months or sooner if needed, call for seizure activity  I spent 30 minutes of face-to-face and non-face-to-face time with patient.  This included previsit chart review, lab review, study review, order entry, electronic health record documentation, patient education.  Margie Ege, AGNP-C, DNP 05/07/2020, 9:20 AM [facility] [address] [place], [place] [address] [phone]

## 2020-05-07 ENCOUNTER — Ambulatory Visit: Payer: PPO | Admitting: Neurology

## 2020-05-07 ENCOUNTER — Encounter: Payer: Self-pay | Admitting: Neurology

## 2020-05-07 ENCOUNTER — Other Ambulatory Visit: Payer: Self-pay

## 2020-05-07 VITALS — BP 145/70 | HR 82 | Ht 61.0 in | Wt 100.0 lb

## 2020-05-07 DIAGNOSIS — R569 Unspecified convulsions: Secondary | ICD-10-CM

## 2020-05-07 DIAGNOSIS — G3184 Mild cognitive impairment, so stated: Secondary | ICD-10-CM

## 2020-05-07 MED ORDER — LAMOTRIGINE ER 250 MG PO TB24: 250.0000 mg | ORAL_TABLET | Freq: Every day | ORAL | 1 refills | Status: DC

## 2020-05-07 MED ORDER — LEVETIRACETAM 500 MG PO TABS: 500.0000 mg | ORAL_TABLET | Freq: Two times a day (BID) | ORAL | 3 refills | Status: DC

## 2020-05-07 NOTE — Patient Instructions (Signed)
Increase Keppra 500 mg twice daily Keep Lamictal dosing same for now Check Lamictal level  Have PCP recheck urine same No driving until seizure free 6 months See you back in 6 months

## 2020-05-08 LAB — LAMOTRIGINE LEVEL: Lamotrigine Lvl: 12.7 ug/mL (ref 2.0–20.0)

## 2020-05-11 ENCOUNTER — Other Ambulatory Visit: Payer: Self-pay | Admitting: Neurology

## 2020-05-11 ENCOUNTER — Telehealth: Payer: Self-pay | Admitting: Neurology

## 2020-05-11 DIAGNOSIS — T148XXA Other injury of unspecified body region, initial encounter: Secondary | ICD-10-CM | POA: Diagnosis not present

## 2020-05-11 DIAGNOSIS — S52502A Unspecified fracture of the lower end of left radius, initial encounter for closed fracture: Secondary | ICD-10-CM | POA: Diagnosis not present

## 2020-05-11 DIAGNOSIS — Z681 Body mass index (BMI) 19 or less, adult: Secondary | ICD-10-CM | POA: Diagnosis not present

## 2020-05-11 DIAGNOSIS — Z789 Other specified health status: Secondary | ICD-10-CM | POA: Diagnosis not present

## 2020-05-11 DIAGNOSIS — R569 Unspecified convulsions: Secondary | ICD-10-CM | POA: Diagnosis not present

## 2020-05-11 DIAGNOSIS — I10 Essential (primary) hypertension: Secondary | ICD-10-CM | POA: Diagnosis not present

## 2020-05-11 DIAGNOSIS — G309 Alzheimer's disease, unspecified: Secondary | ICD-10-CM | POA: Diagnosis not present

## 2020-05-11 DIAGNOSIS — Z299 Encounter for prophylactic measures, unspecified: Secondary | ICD-10-CM | POA: Diagnosis not present

## 2020-05-11 NOTE — Telephone Encounter (Signed)
Eddie from [facility] called needing to clarify the prescription for the pt's levETIRAcetam (KEPPRA) 500 MG tablet with RN. Please advise.

## 2020-05-11 NOTE — Telephone Encounter (Signed)
I called Sheri Valencia with [name] pharmacy and reiterated that the levetiracetam 500mg  po bid #180 and 3 refills per --05-06-20 from last OV.  They did not get order.  I did VO to him. He confirmed.   [birth year]1993

## 2020-05-13 ENCOUNTER — Ambulatory Visit (INDEPENDENT_AMBULATORY_CARE_PROVIDER_SITE_OTHER): Payer: PPO | Admitting: Neurology

## 2020-05-13 ENCOUNTER — Telehealth: Payer: Self-pay

## 2020-05-13 DIAGNOSIS — R569 Unspecified convulsions: Secondary | ICD-10-CM

## 2020-05-13 NOTE — Telephone Encounter (Signed)
-----   Message from Glean Salvo, NP sent at 05/11/2020  5:53 AM EST ----- Lamictal level is within normal range, continue on higher dose of Keppra. Keep current dose of Lamictal. Next step is EEG.

## 2020-05-13 NOTE — Telephone Encounter (Signed)
Pt verified by name and DOB,  normal results given per provider, pt voiced understanding all question answered. °

## 2020-05-15 DIAGNOSIS — S52102D Unspecified fracture of upper end of left radius, subsequent encounter for closed fracture with routine healing: Secondary | ICD-10-CM | POA: Diagnosis not present

## 2020-05-22 NOTE — Procedures (Signed)
   HISTORY: 75 year old female, with history of complex partial seizure with secondary generalization  TECHNIQUE:  This is a routine 16 channel EEG recording with one channel devoted to a limited EKG recording.  It was performed during wakefulness, drowsiness and asleep.  Hyperventilation and photic stimulation were performed as activating procedures.  There are minimum muscle and movement artifact noted.  Upon maximum arousal, posterior dominant waking rhythm consistent of mildly dysrhythmic alpha range activity, with frequency of 10 hz. Activities are symmetric over the bilateral posterior derivations and attenuated with eye opening.  Hyperventilation produced mild/moderate buildup with higher amplitude and the slower activities noted.  Photic stimulation did not alter the tracing.  During EEG recording, patient developed drowsiness and entered no deeper stage of sleep was achieved  During EEG recording, there was no epileptiform discharge noted.  EKG demonstrate sinus rhythm, with heart rate of 76 bpm  CONCLUSION: This is a  normal awake EEG.  There is no electrodiagnostic evidence of epileptiform discharge.  Levert Feinstein, M.D. Ph.D.  [facility] [address] [place], [place] [address] Phone: [phone] Fax:      [phone]

## 2020-05-25 ENCOUNTER — Telehealth: Payer: Self-pay

## 2020-05-25 NOTE — Telephone Encounter (Signed)
Pt verified by name and DOB,  normal results given per provider, pt voiced understanding all question answered. °

## 2020-05-25 NOTE — Telephone Encounter (Signed)
-----   Message from Glean Salvo, NP sent at 05/25/2020  5:50 AM EDT ----- Please call the patient, EEG was normal.  CONCLUSION: This is a  normal awake EEG.  There is no electrodiagnostic evidence of epileptiform discharge.

## 2020-06-03 ENCOUNTER — Ambulatory Visit: Payer: PPO | Admitting: Neurology

## 2020-06-05 DIAGNOSIS — S52102D Unspecified fracture of upper end of left radius, subsequent encounter for closed fracture with routine healing: Secondary | ICD-10-CM | POA: Diagnosis not present

## 2020-06-26 DIAGNOSIS — S52102D Unspecified fracture of upper end of left radius, subsequent encounter for closed fracture with routine healing: Secondary | ICD-10-CM | POA: Diagnosis not present

## 2020-08-03 DIAGNOSIS — E7800 Pure hypercholesterolemia, unspecified: Secondary | ICD-10-CM | POA: Diagnosis not present

## 2020-08-03 DIAGNOSIS — I10 Essential (primary) hypertension: Secondary | ICD-10-CM | POA: Diagnosis not present

## 2020-08-12 DIAGNOSIS — Z681 Body mass index (BMI) 19 or less, adult: Secondary | ICD-10-CM | POA: Diagnosis not present

## 2020-08-12 DIAGNOSIS — E440 Moderate protein-calorie malnutrition: Secondary | ICD-10-CM | POA: Diagnosis not present

## 2020-08-12 DIAGNOSIS — Z713 Dietary counseling and surveillance: Secondary | ICD-10-CM | POA: Diagnosis not present

## 2020-08-12 DIAGNOSIS — I10 Essential (primary) hypertension: Secondary | ICD-10-CM | POA: Diagnosis not present

## 2020-08-12 DIAGNOSIS — Z299 Encounter for prophylactic measures, unspecified: Secondary | ICD-10-CM | POA: Diagnosis not present

## 2020-09-04 ENCOUNTER — Other Ambulatory Visit: Payer: Self-pay

## 2020-09-04 ENCOUNTER — Other Ambulatory Visit: Payer: Self-pay | Admitting: Internal Medicine

## 2020-09-04 ENCOUNTER — Ambulatory Visit
Admission: RE | Admit: 2020-09-04 | Discharge: 2020-09-04 | Disposition: A | Discharge status: Home / Self Care | Payer: PPO | Source: Ambulatory Visit | Attending: Internal Medicine | Admitting: Internal Medicine

## 2020-09-04 DIAGNOSIS — Z1231 Encounter for screening mammogram for malignant neoplasm of breast: Secondary | ICD-10-CM | POA: Diagnosis not present

## 2020-11-04 DIAGNOSIS — E7800 Pure hypercholesterolemia, unspecified: Secondary | ICD-10-CM | POA: Diagnosis not present

## 2020-11-04 DIAGNOSIS — I10 Essential (primary) hypertension: Secondary | ICD-10-CM | POA: Diagnosis not present

## 2020-11-17 ENCOUNTER — Encounter: Payer: Self-pay | Admitting: Neurology

## 2020-11-17 ENCOUNTER — Ambulatory Visit: Payer: PPO | Admitting: Neurology

## 2020-11-17 VITALS — BP 162/69 | HR 61 | Ht 61.0 in | Wt 106.0 lb

## 2020-11-17 DIAGNOSIS — R569 Unspecified convulsions: Secondary | ICD-10-CM

## 2020-11-17 MED ORDER — LAMOTRIGINE ER 250 MG PO TB24: 250.0000 mg | ORAL_TABLET | Freq: Every day | ORAL | 4 refills | Status: DC

## 2020-11-17 MED ORDER — LEVETIRACETAM 500 MG PO TABS: 500.0000 mg | ORAL_TABLET | Freq: Two times a day (BID) | ORAL | 4 refills | Status: DC

## 2020-11-17 NOTE — Progress Notes (Signed)
ASSESSMENT AND PLAN 75 y.o. year old female    Complex partial seizure with secondary generalization, recent seizure 05/02/2020, previous September 2021  Stable on lamotrigine XR 250 mg, and Keppra 500 mg twice daily  Check level for baseline today  She has been seizure-free for 6 months, will go back driving,   Depression  Improved Mild cognitive impairment  On Namenda 10 mg twice a day   DIAGNOSTIC DATA (LABS, IMAGING, TESTING) - I reviewed patient records, labs, notes, testing and imaging myself where available.  HISTORY OF PRESENT ILLNESS:    Sheri Valencia is a 75 years old right-handed Caucasian female,followed  for complex partial seizure with secondary generalization. She has a history of complex partial seizure with secondary generalization since age 33,  presented with rising sensation, then passed out followed by generalized motor seizure, last seizure was in 2002, she was treated with Lamictal 200 mg twice a day, continue to have recurrent seizure, Keppra 500 mg 2 tablets twice a day was added on, no recurrent seizure since. 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 Other levels alsodemonstrate significant stenosis.. She denies significant neck pain She has mild unsteady gait, always contributed to her left hip problem,  She was evaluated recently  by Dr. Channing Mutters, neurosurgeon, she follows up yearly   She returns for reevaluation and refills. She is on Aricept by PCP Dr. [name].    UPDATE Jul 05 2017:  I was able to review the letter from her  Sister Sheri Valencia, she is accompanied by her friend Sheri Valencia at today's clinical visit,   "From her sister's letter: Sheri Valencia continue has memory problem, easily gets confused, gets frustrated easily, but deny her problem, there was also description of paranoid, emotional incontinence, forgetful, poor decision-making process for her health, irrational spell, recent purchase of a new car, but difficult to operating her new vehicle, impulsive action, got lost while driving, difficulty working with her computer, loose piece constantly, difficulty managing her bills, ongoing since 2017, progressively getting worse"   Sheri Valencia drove her here today, she has lived [place] for 50 years, 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   She likes to walk, reading.  She lives in a codominum.    She has not have seizure for few years. 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   UPDATE October 26 2017: She was able to taper off Keppra 500 mg twice daily, only on lamotrigine 200 mg twice a day, doing better, along with Namenda 10 mg twice a day,  her memory seems to improve will stabilize, care of her elderly aunt,   We personally reviewed MRI of the brain in May 2019, mild generalized atrophy no acute  abnormality,   EEG was normal    Laboratory evaluation in May 2019 showed normal TSH, B12, RPR, CBC, CMP showed mild elevated 1.25, with GFR of 44, Keppra level was 66, lamotrigine level was 22, she has no signs of toxicity, the level was not trough level   Virtual Visit via Video on Jul 11, 2018   She is with her sister Sheri Valencia at visit.  She had no recurrent seizure, taking lamotrigine 200 mg twice a day, Keppra 500 mg twice a day, he complains of increased confusion, sometimes unsteady gait, fell few times, sister also concerned about worsening memory loss, had few incident of mis- handling her debit card,   Previously we have tried to taper off Keppra 500 mg 2 tablets twice a day, because her underweight, has not had recurrent seizure for many years, but she is concerned about potential recurrent seizure, put herself back on Keppra 1 tablets twice a day,   Laboratory evaluations on Jul 05, 2017, normal TSH 1.9, lamotrigine 22.7, Keppra 66.5, normal B12, CBC hemoglobin of 11.1, creatinine of 1.25, potassium of 5.4   Update October 31, 2018: She is with her sister Sheri Valencia at today's visit, she continue to take lamotrigine 200 mg twice a day, has no recurrent seizure, she has worsening memory loss, excessive daytime sleepiness, fatigue, continue have depression, taking Zoloft 100 mg daily, she complains of change of taste, has decreased appetite, with some weight loss,   Laboratory evaluations on Jul 12, 2018 showed lamotrigine level was 17,   UPDATE Apr 05 2019: She is accompanied by her Sister Sheri Valencia at today's clinical visit, before Christmas, she complains of few days history of poor appetite, frequent diarrhea, on March 03, 2019, she woke up on the floor, has difficulty getting up from the floor, had a transient loss of consciousness," but felt like it is not a seizure", she called ambulance, was brought to the emergency room at [facility], lamotrigine level was 30, I personally reviewed CT head  without contrast showed no acute findings UA showed evidence of UTI, her symptoms improved with hydration,   However, she remained confused, weak, difficulty walking was admitted to [facility] on March 08, 2019, personally reviewed record, CT head showed no acute abnormality, CT of cervical spine showed multilevel degenerative changes, most severe at C5-6, C6 and 7,   She was treated for UTI, planning on to take Macrobid 100 mg daily for 1 month, there was no recurrent seizure activity, she is back on her home regimen of lamotrigine ER 200 mg 2 tablets every night   She does complains of low back pain, continue to feel generalized weakness, gait abnormality, "I have to think about how to walk my leg", also worsening urinary urgency, incontinence   Laboratory evaluation showed mild anemia hemoglobin of 10.8 CMP showed mildly low potassium 3.4, decreased albumin 3.4   She now lives with her sister temporarily, planning on for short-term assisted living placement due to her continued gait abnormality, worsening confusion,   UPDATE May 14 2019: She is with her sister at today's clinical visit, she is currently lives at at [place], she had a significant improvement, no gait abnormality, no recurrent seizure, is planning on going home soon.   Acute onset of confusion in December 2020, is most likely related to her UTI,  dehydration, polypharmacy treatment, depression anxiety, Lexapro 10 mg daily has been helpful.   We personally reviewed MRI of cervical spine on April 29, 2019: Multilevel degenerative changes, most severe at C5-6, with rightward disc protrusion, borderline mild spinal stenosis, no cord signal changes, severe right foraminal stenosis,  UPDATE Sept 13 2022: She had a breakthrough seizure on November 30, 2019, and May 02, 2020, both seizure happened while her sister was driving, she suddenly become required, forceful head turning to the right, neck hyperextension, body  tonic-clonic movement,  She is now back on lamotrigine XR 250 mg every night, Keppra 500 mg twice a day was added on, she tolerated it well, there was no recurrent seizure   PHYSICAL EXAM  Vitals:   11/17/20 1001  BP: (!) 162/69  Pulse: 61  Weight: 106 lb (48.1 kg)  Height: 5\' 1"  (1.549 m)   Body mass index is 20.03 kg/m.  Generalized: Well developed, in no acute distress  MMSE - Mini Mental State Exam 05/07/2020 12/04/2019 04/01/2019  Orientation to time 5 5 3   Orientation to Place 5 5 5   Registration 3 3 3   Attention/ Calculation 5 5 5   Recall 2 3 2   Language- name 2 objects 2 2 2   Language- repeat 1 1 1   Language- follow 3 step command 3 3 3   Language- read & follow direction 1 1 1   Write a sentence 1 1 1   Copy design 1 0 0  Total score 29 29 26     PHYSICAL EXAMNIATION:  Gen: NAD, conversant, well nourised, well groomed                     Cardiovascular: Regular rate rhythm, no peripheral edema, warm, nontender. Eyes: Conjunctivae clear without exudates or hemorrhage Neck: Supple, no carotid bruits. Pulmonary: Clear to auscultation bilaterally   NEUROLOGICAL EXAM:  MENTAL STATUS: Speech/Cognition: Awake, alert, normal speech, oriented to history taking and casual conversation.  CRANIAL NERVES: CN II: Visual fields are full to confrontation.  Pupils are round equal and briskly reactive to light. CN III, IV, VI: extraocular movement are normal. No ptosis. CN V: Facial sensation is intact to light touch. CN VII: Face is symmetric with normal eye closure and smile. CN VIII: Hearing is normal to casual conversation CN IX, X: Palate elevates symmetrically. Phonation is normal. CN XI: Head turning and shoulder shrug are intact CN XII: Tongue is midline with normal movements and no atrophy.  MOTOR: Muscle bulk and tone are normal. Muscle strength is normal.  REFLEXES: Reflexes are 2  and symmetric at the biceps, triceps, knees and ankles. Plantar responses are  flexor.  SENSORY: Intact to light touch, pinprick, positional and vibratory sensation at fingers and toes.  COORDINATION: There is no trunk or limb ataxia.    GAIT/STANCE: Posture is normal. Gait is steady with normal steps, base, arm swing and turning.    REVIEW OF SYSTEMS: Out of a complete 14 system review of symptoms, the patient complains only of the following symptoms, and all other reviewed systems are negative.  Seizure  ALLERGIES: Allergies  Allergen Reactions   Ativan [Lorazepam]     confusion    HOME MEDICATIONS: Outpatient Medications Prior to Visit  Medication Sig Dispense Refill   alendronate (FOSAMAX) 70 MG tablet Take 70 mg by mouth once a week.     aspirin EC 81 MG tablet Take 81 mg by mouth daily.     LamoTRIgine 250 MG TB24 24 hour tablet Take  250 mg by mouth at bedtime. 90 tablet 1   levETIRAcetam (KEPPRA) 500 MG tablet Take 1 tablet (500 mg total) by mouth 2 (two) times daily. 180 tablet 3   memantine (NAMENDA) 10 MG tablet Take 1 tablet (10 mg total) by mouth 2 (two) times daily. 180 tablet 3   rosuvastatin (CRESTOR) 5 MG tablet Take 5 mg by mouth daily.     polyethylene glycol (MIRALAX / GLYCOLAX) 17 g packet Take 17 g by mouth daily as needed for mild constipation.     triamcinolone cream (KENALOG) 0.1 % Apply 1 application topically 2 (two) times daily as needed (rash).     No facility-administered medications prior to visit.    PAST MEDICAL HISTORY: Past Medical History:  Diagnosis Date   Memory loss    Seizure (HCC)     PAST SURGICAL HISTORY: Past Surgical History:  Procedure Laterality Date   APPENDECTOMY     TUBAL LIGATION      FAMILY HISTORY: Family History  Problem Relation Age of Onset   Pneumonia Mother    Heart attack Maternal Grandmother    Stroke Maternal Grandmother    Suicidality Brother    Aneurysm Maternal Aunt    Breast cancer Sister        04/2015  Stage I    SOCIAL HISTORY: Social History   Socioeconomic  History   Marital status: Widowed    Spouse name: Not on file   Number of children: 0   Years of education: college   Highest education level: Not on file  Occupational History    Employer: RETIRED    Comment: retired  Tobacco Use   Smoking status: Never   Smokeless tobacco: Never  Substance and Sexual Activity   Alcohol use: Yes    Alcohol/week: 1.0 standard drink    Types: 1 Glasses of wine per week   Drug use: No   Sexual activity: Not on file  Other Topics Concern   Not on file  Social History Narrative   Patient lives at home alone and she is widowed. Retired.   College education    Caffeine one cup daily.         Social Determinants of Health   Financial Resource Strain: Not on file  Food Insecurity: Not on file  Transportation Needs: Not on file  Physical Activity: Not on file  Stress: Not on file  Social Connections: Not on file  Intimate Partner Violence: Not on file   Levert Feinstein, M.D. Ph.D.  [facility] [address] [place], [place] [address] Phone: [phone] Fax:      [phone]

## 2020-11-18 ENCOUNTER — Other Ambulatory Visit: Payer: Self-pay | Admitting: Emergency Medicine

## 2020-11-18 ENCOUNTER — Other Ambulatory Visit: Payer: Self-pay | Admitting: Neurology

## 2020-11-18 MED ORDER — MEMANTINE HCL 10 MG PO TABS: 10.0000 mg | ORAL_TABLET | Freq: Two times a day (BID) | ORAL | 3 refills | Status: DC

## 2020-11-20 LAB — TSH: TSH: 3.36 u[IU]/mL (ref 0.450–4.500)

## 2020-11-20 LAB — LAMOTRIGINE LEVEL: Lamotrigine Lvl: 13.7 ug/mL (ref 2.0–20.0)

## 2020-11-20 LAB — LEVETIRACETAM LEVEL: Levetiracetam Lvl: 39.8 ug/mL (ref 10.0–40.0)

## 2020-12-18 DIAGNOSIS — S52102D Unspecified fracture of upper end of left radius, subsequent encounter for closed fracture with routine healing: Secondary | ICD-10-CM | POA: Diagnosis not present

## 2021-02-05 DIAGNOSIS — Z299 Encounter for prophylactic measures, unspecified: Secondary | ICD-10-CM | POA: Diagnosis not present

## 2021-02-05 DIAGNOSIS — Z789 Other specified health status: Secondary | ICD-10-CM | POA: Diagnosis not present

## 2021-02-05 DIAGNOSIS — F0280 Dementia in other diseases classified elsewhere without behavioral disturbance: Secondary | ICD-10-CM | POA: Diagnosis not present

## 2021-02-05 DIAGNOSIS — G309 Alzheimer's disease, unspecified: Secondary | ICD-10-CM | POA: Diagnosis not present

## 2021-02-05 DIAGNOSIS — I10 Essential (primary) hypertension: Secondary | ICD-10-CM | POA: Diagnosis not present

## 2021-02-05 DIAGNOSIS — R6889 Other general symptoms and signs: Secondary | ICD-10-CM | POA: Diagnosis not present

## 2021-02-12 DIAGNOSIS — E559 Vitamin D deficiency, unspecified: Secondary | ICD-10-CM | POA: Diagnosis not present

## 2021-02-12 DIAGNOSIS — I10 Essential (primary) hypertension: Secondary | ICD-10-CM | POA: Diagnosis not present

## 2021-02-12 DIAGNOSIS — Z7189 Other specified counseling: Secondary | ICD-10-CM | POA: Diagnosis not present

## 2021-02-12 DIAGNOSIS — Z6820 Body mass index (BMI) 20.0-20.9, adult: Secondary | ICD-10-CM | POA: Diagnosis not present

## 2021-02-12 DIAGNOSIS — Z0000 Encounter for general adult medical examination without abnormal findings: Secondary | ICD-10-CM | POA: Diagnosis not present

## 2021-02-12 DIAGNOSIS — Z1211 Encounter for screening for malignant neoplasm of colon: Secondary | ICD-10-CM | POA: Diagnosis not present

## 2021-02-12 DIAGNOSIS — R5383 Other fatigue: Secondary | ICD-10-CM | POA: Diagnosis not present

## 2021-02-12 DIAGNOSIS — Z1339 Encounter for screening examination for other mental health and behavioral disorders: Secondary | ICD-10-CM | POA: Diagnosis not present

## 2021-02-12 DIAGNOSIS — Z1331 Encounter for screening for depression: Secondary | ICD-10-CM | POA: Diagnosis not present

## 2021-02-12 DIAGNOSIS — Z299 Encounter for prophylactic measures, unspecified: Secondary | ICD-10-CM | POA: Diagnosis not present

## 2021-02-12 DIAGNOSIS — E7800 Pure hypercholesterolemia, unspecified: Secondary | ICD-10-CM | POA: Diagnosis not present

## 2021-02-12 DIAGNOSIS — Z79899 Other long term (current) drug therapy: Secondary | ICD-10-CM | POA: Diagnosis not present

## 2021-03-04 DIAGNOSIS — Z6820 Body mass index (BMI) 20.0-20.9, adult: Secondary | ICD-10-CM | POA: Diagnosis not present

## 2021-03-04 DIAGNOSIS — U071 COVID-19: Secondary | ICD-10-CM | POA: Diagnosis not present

## 2021-03-04 DIAGNOSIS — R5383 Other fatigue: Secondary | ICD-10-CM | POA: Diagnosis not present

## 2021-03-04 DIAGNOSIS — E7800 Pure hypercholesterolemia, unspecified: Secondary | ICD-10-CM | POA: Diagnosis not present

## 2021-03-04 DIAGNOSIS — Z299 Encounter for prophylactic measures, unspecified: Secondary | ICD-10-CM | POA: Diagnosis not present

## 2021-03-05 DIAGNOSIS — E785 Hyperlipidemia, unspecified: Secondary | ICD-10-CM | POA: Diagnosis not present

## 2021-05-20 ENCOUNTER — Ambulatory Visit: Payer: PPO | Admitting: Neurology

## 2021-05-20 ENCOUNTER — Encounter: Payer: Self-pay | Admitting: Neurology

## 2021-05-20 VITALS — Ht 61.0 in | Wt 104.0 lb

## 2021-05-20 DIAGNOSIS — R569 Unspecified convulsions: Secondary | ICD-10-CM

## 2021-05-20 MED ORDER — LEVETIRACETAM 500 MG PO TABS: 500.0000 mg | ORAL_TABLET | Freq: Two times a day (BID) | ORAL | 4 refills | Status: DC

## 2021-05-20 MED ORDER — LAMOTRIGINE ER 250 MG PO TB24: 250.0000 mg | ORAL_TABLET | Freq: Every day | ORAL | 4 refills | Status: DC

## 2021-05-20 NOTE — Patient Instructions (Signed)
You look great today ?Continue current medications ?Call for seizures  ?

## 2021-05-20 NOTE — Progress Notes (Signed)
? ? ?PATIENT: Sheri Valencia ?DOB: 1946/02/02 ? ?REASON FOR VISIT: Follow up for seizures ?HISTORY FROM: Patient ?PRIMARY NEUROLOGIST: Dr.Yan  ? ?ASSESSMENT AND PLAN ?76 y.o. year old female  ? ?1.  Complex partial seizure with secondary generalization ?-Under good control, most recent seizure February 2022 ?-Continue lamotrigine XR 250 mg daily, Keppra 500 mg twice daily  ?-Lamictal level 13.7, Keppra level 39.12 November 2020 ? ?2.  Depression ?-Doing well, living alone, back to driving, on Zoloft ? ?3.  Mild cognitive impairment ?-Stable, recheck memory testing next visit ?-Continue Namenda 10 mg twice a day ? ?HISTORY  ? Sheri Valencia is a 76 years old right-handed Caucasian female,followed  for complex partial seizure with secondary generalization. She has a history of complex partial seizure with secondary generalization since age 89,  presented with rising sensation, then passed out followed by generalized motor seizure, last seizure was in 2002, she was treated with Lamictal 200 mg twice a day, continue to have recurrent seizure, Keppra 500 mg 2 tablets twice a day was added on, no recurrent seizure since. 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 Other levels alsodemonstrate significant stenosis.. She denies significant neck pain ?She has mild unsteady gait, always contributed to her left hip problem,  She was evaluated recently  by Dr. Channing Mutters, neurosurgeon, she follows up yearly   She returns for reevaluation and refills.  She is on Aricept by PCP Dr. [name].  ?  ?UPDATE Jul 05 2017:  ?I was able to review the letter from her  Sister [name], she is accompanied by her friend Sheri Valencia at today's clinical visit, ?  ?"From her sister's letter: Sheri Valencia continue has memory problem, easily gets confused, gets frustrated easily, but deny her problem, there was also description of paranoid, emotional incontinence, forgetful, poor decision-making process for her health, irrational spell, recent purchase of a new car, but difficult to operating her new vehicle, impulsive action, got lost while driving, difficulty working with her computer, loose piece constantly, difficulty managing her bills, ongoing since 2017, progressively getting worse" ?  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   She likes to walk, reading.  She lives in a codominum.  ?  ?She has not have seizure for few years. She manage her own medications, but during the conversation, she was noted to have mild confusion, she did report short-term memory loss, has difficulty following conversations, ?  ?She was given a prescription of Aricept, complains of fatigue taking medications, has stopped it, her paternal grandmother suffered dementia ?  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 ?  ?UPDATE October 26 2017: ?She was able to taper off Keppra 500 mg twice daily, only on lamotrigine 200 mg twice a day, doing better, along with Namenda  10 mg twice a day, her memory seems to improve will stabilize, care of her elderly aunt, ?  ?We personally reviewed MRI of the brain in  May 2019, mild generalized atrophy no acute abnormality, ?  ?EEG was normal  ?  ?Laboratory evaluation in May 2019 showed normal TSH, B12, RPR, CBC, CMP showed mild elevated 1.25, with GFR of 44, Keppra level was 66, lamotrigine level was 22, she has no signs of toxicity, the level was not trough level ?  ?Virtual Visit via Video on Jul 11, 2018 ?  ?She is with her sister Sheri Valencia at visit.  She had no recurrent seizure, taking lamotrigine 200 mg twice a day, Keppra 500 mg twice a day, he complains of increased confusion, sometimes unsteady gait, fell few times, sister also concerned about worsening memory loss, had few incident of mis- handling her debit card, ?  ?Previously we have tried to taper off Keppra 500 mg 2 tablets twice a day, because her underweight, has not had recurrent seizure for many years, but she is concerned about potential recurrent seizure, put herself back on Keppra 1 tablets twice a day, ?  ?Laboratory evaluations on Jul 05, 2017, normal TSH 1.9, lamotrigine 22.7, Keppra 66.5, normal B12, CBC hemoglobin of 11.1, creatinine of 1.25, potassium of 5.4 ?  ?Update October 31, 2018: ?She is with her sister Sheri Valencia at today's visit, she continue to take lamotrigine 200 mg twice a day, has no recurrent seizure, she has worsening memory loss, excessive daytime sleepiness, fatigue, continue have depression, taking Zoloft 100 mg daily, she complains of change of taste, has decreased appetite, with some weight loss, ?  ?Laboratory evaluations on Jul 12, 2018 showed lamotrigine level was 70, ?  ?UPDATE Apr 05 2019: ?She is accompanied by her Sister Sheri Valencia at today's clinical visit, before Christmas, she complains of few days history of poor appetite, frequent diarrhea, on March 03, 2019, she woke up on the floor, has difficulty getting up from the floor, had a transient loss of consciousness," but felt like it is not a seizure", she called ambulance, was brought to the emergency room at [facility], lamotrigine  level was 30, I personally reviewed CT head without contrast showed no acute findings ?UA showed evidence of UTI, her symptoms improved with hydration, ?  ?However, she remained confused, weak, difficulty walking was admitted to [facility] on March 08, 2019, personally reviewed record, CT head showed no acute abnormality, CT of cervical spine showed multilevel degenerative changes, most severe at C5-6, C6 and 7, ?  ?She was treated for UTI, planning on to take Macrobid 100 mg daily for 1 month, there was no recurrent seizure activity, she is back on her home regimen of lamotrigine ER 200 mg 2 tablets every night ?  ?She does complains of low back pain, continue to feel generalized weakness, gait abnormality, "I have to think about how to walk my leg", also worsening urinary urgency, incontinence ?  ?Laboratory evaluation showed mild anemia hemoglobin of 10.8 CMP showed mildly low potassium 3.4, decreased albumin 3.4 ?  ?She now lives with her sister temporarily, planning on for short-term assisted living placement due to her continued gait abnormality, worsening confusion, ?  ?UPDATE May 14 2019: ?She is with her sister at today's clinical visit, she is currently lives at at [place], she had a significant improvement, no gait abnormality, no recurrent seizure, is planning on going home soon. ?  ?Acute onset of confusion in December 2020, is most  likely related to her UTI, dehydration, polypharmacy treatment, depression anxiety, Lexapro 10 mg daily has been helpful. ?  ?We personally reviewed MRI of cervical spine on April 29, 2019: Multilevel degenerative changes, most severe at C5-6, with rightward disc protrusion, borderline mild spinal stenosis, no cord signal changes, severe right foraminal stenosis, ?  ?UPDATE Sept 13 2022: ?She had a breakthrough seizure on November 30, 2019, and May 02, 2020, both seizure happened while her sister was driving, she suddenly become required, forceful head  turning to the right, neck hyperextension, body tonic-clonic movement, ? ?She is now back on lamotrigine XR 250 mg every night, Keppra 500 mg twice a day was added on, she tolerated it well, there was no recur

## 2021-06-16 DIAGNOSIS — E785 Hyperlipidemia, unspecified: Secondary | ICD-10-CM | POA: Diagnosis not present

## 2021-06-16 DIAGNOSIS — G309 Alzheimer's disease, unspecified: Secondary | ICD-10-CM | POA: Diagnosis not present

## 2021-06-16 DIAGNOSIS — F330 Major depressive disorder, recurrent, mild: Secondary | ICD-10-CM | POA: Diagnosis not present

## 2021-06-16 DIAGNOSIS — M810 Age-related osteoporosis without current pathological fracture: Secondary | ICD-10-CM | POA: Diagnosis not present

## 2021-06-16 DIAGNOSIS — F0280 Dementia in other diseases classified elsewhere without behavioral disturbance: Secondary | ICD-10-CM | POA: Diagnosis not present

## 2021-06-16 DIAGNOSIS — G40909 Epilepsy, unspecified, not intractable, without status epilepticus: Secondary | ICD-10-CM | POA: Diagnosis not present

## 2021-06-16 DIAGNOSIS — R32 Unspecified urinary incontinence: Secondary | ICD-10-CM | POA: Diagnosis not present

## 2021-06-16 DIAGNOSIS — I10 Essential (primary) hypertension: Secondary | ICD-10-CM | POA: Diagnosis not present

## 2021-07-11 IMAGING — MG MM DIGITAL SCREENING BILAT W/ TOMO AND CAD
8 series · 9 of 24 positions shown · non-contrast
Comparison: Previous exam(s).

CLINICAL DATA: Screening.

EXAM:
DIGITAL SCREENING BILATERAL MAMMOGRAM WITH TOMOSYNTHESIS AND CAD
TECHNIQUE: Bilateral screening digital craniocaudal and mediolateral oblique
mammograms were obtained. Bilateral screening digital breast
tomosynthesis was performed. The images were evaluated with
computer-aided detection.

[L CC synth-2D]
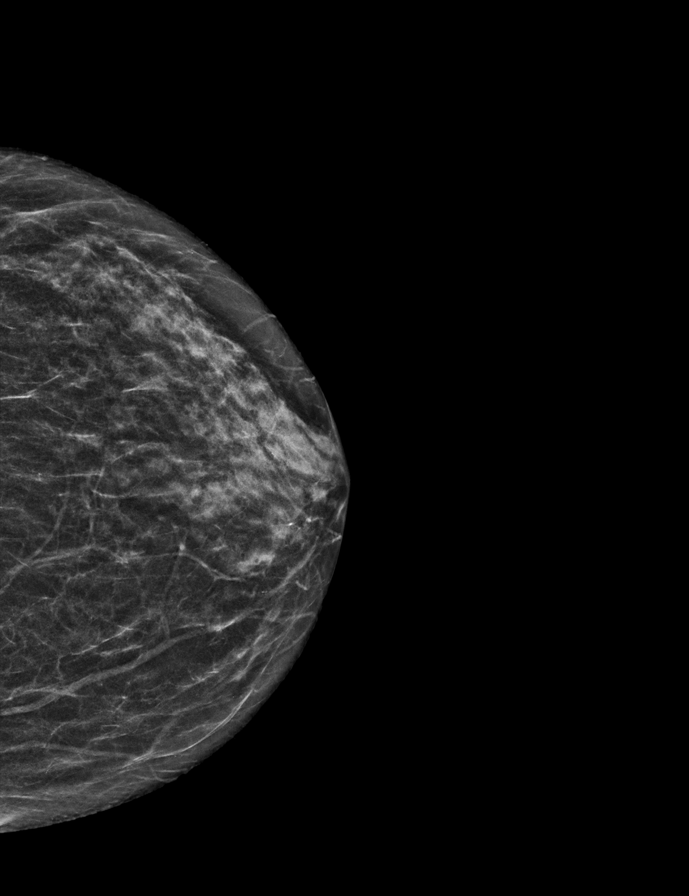

[L MLO synth-2D]
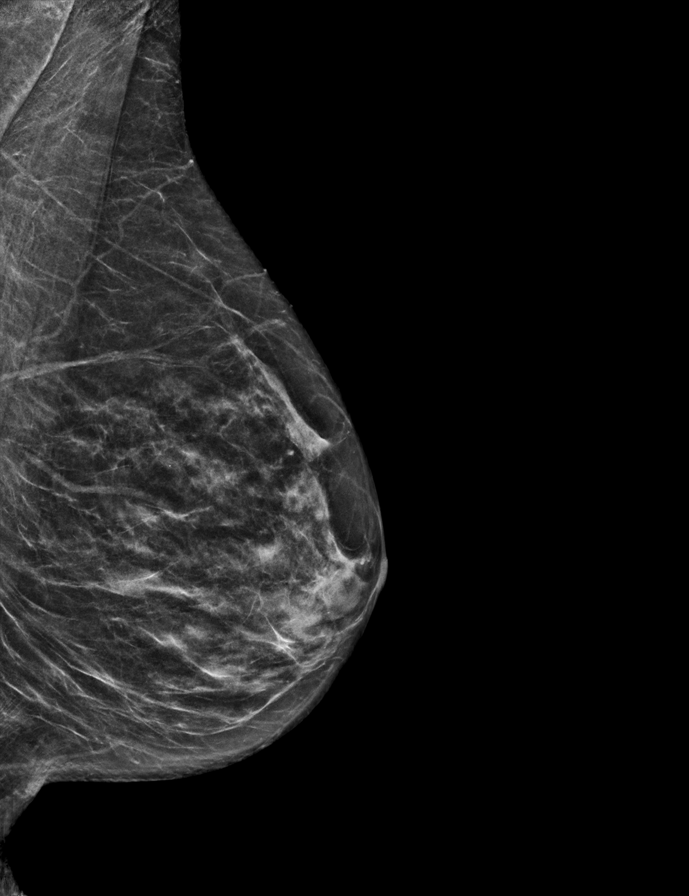

[R MLO synth-2D]
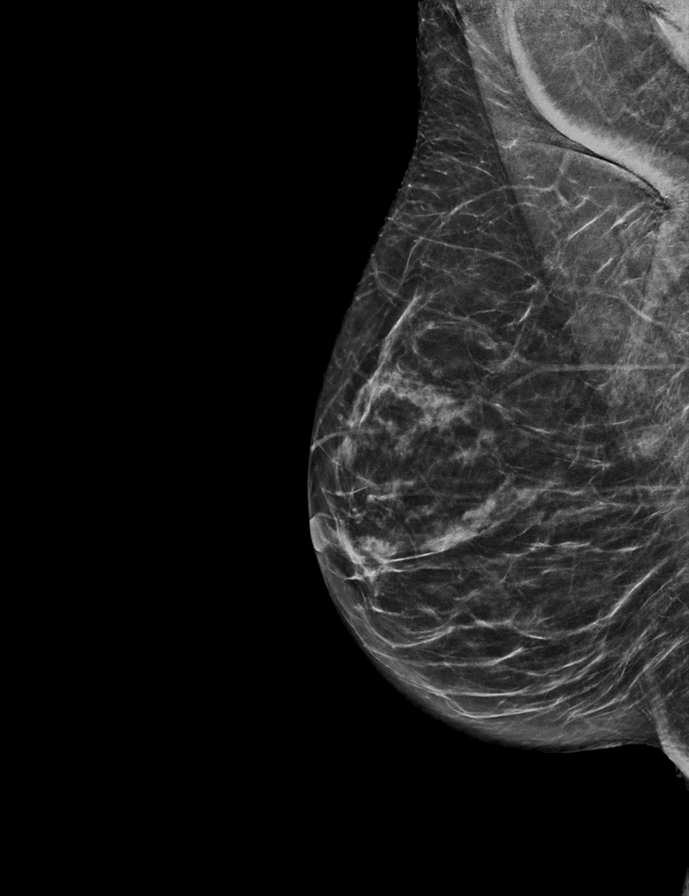

[R CC synth-2D]
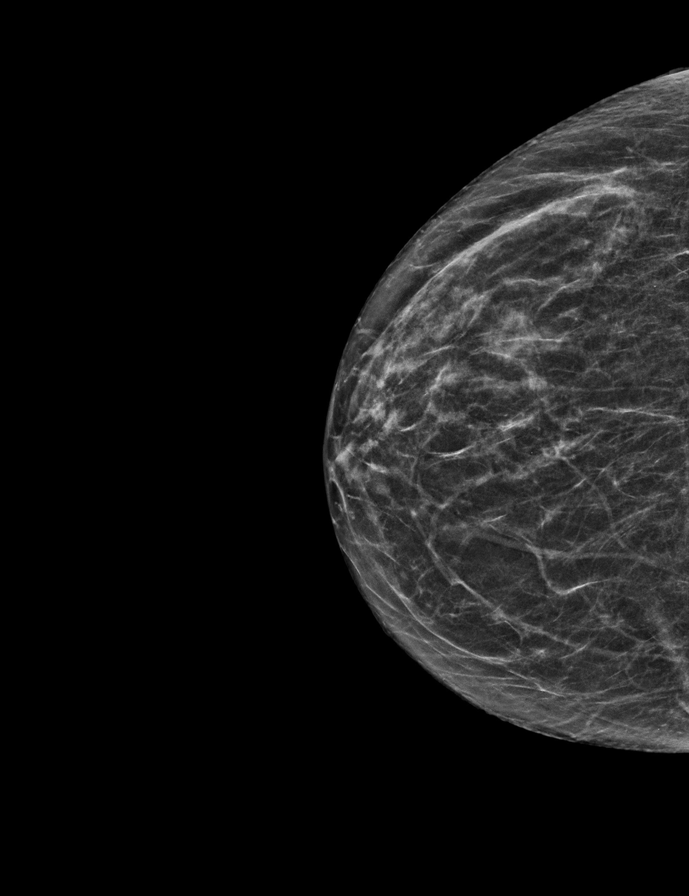

[L MLO tomo · 2 of 48 frames shown]
[frame 16/48]
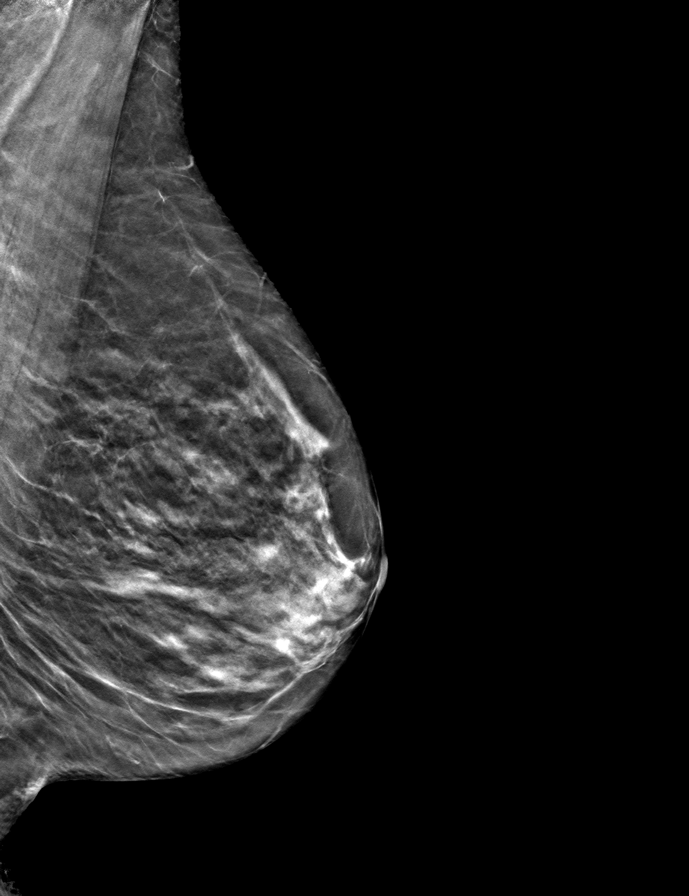
[frame 25/48]
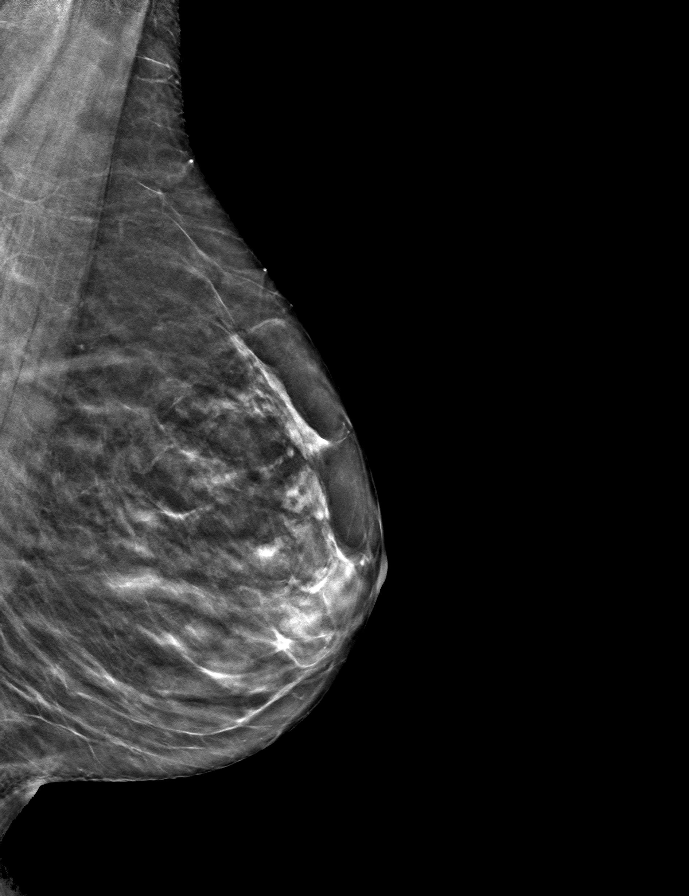

[L CC tomo · tomo slice 22/43.0]
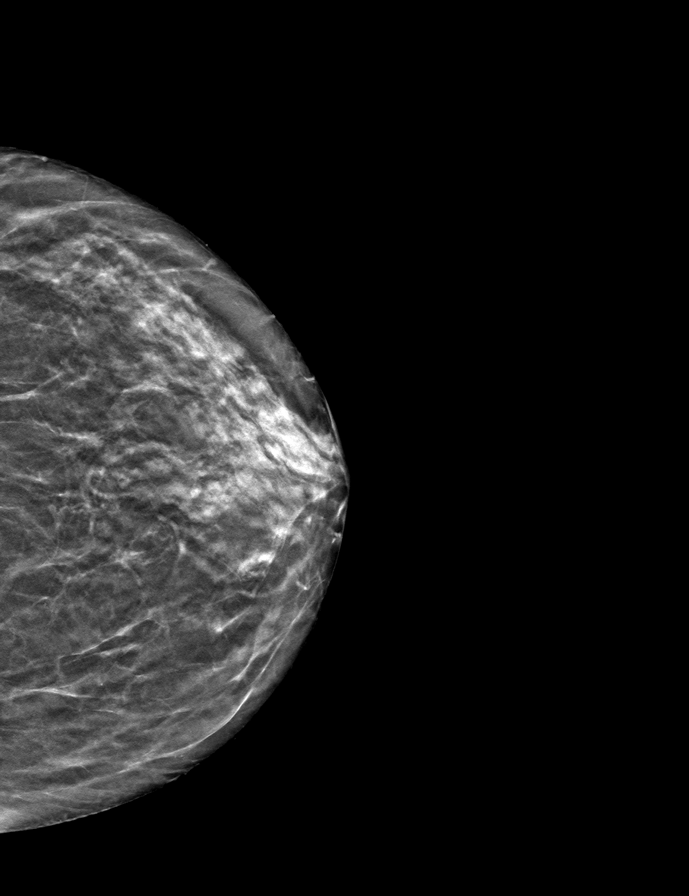

[R MLO tomo · tomo slice 26/51.0]
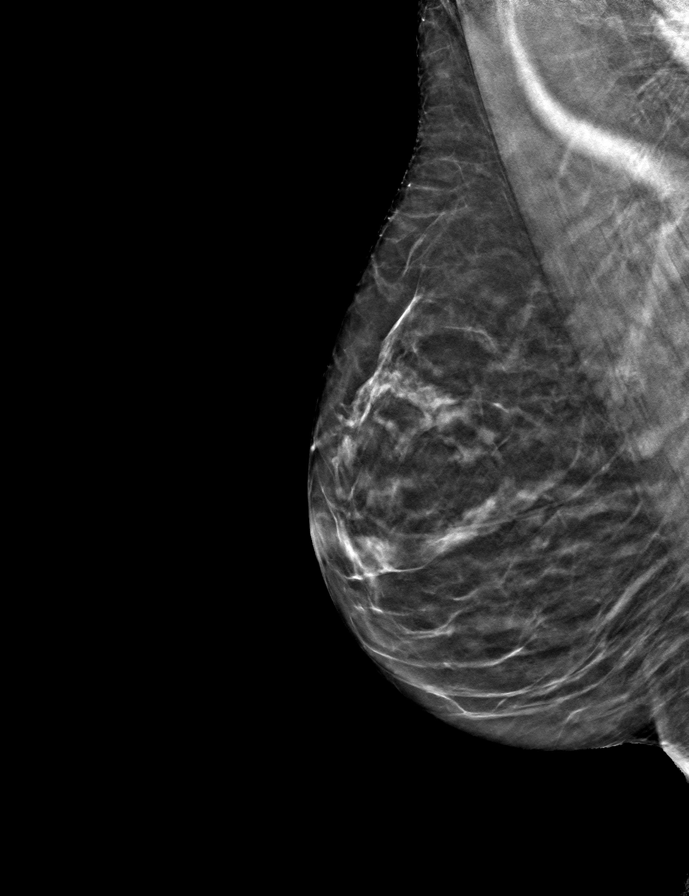

[R CC tomo · tomo slice 24/47.0]
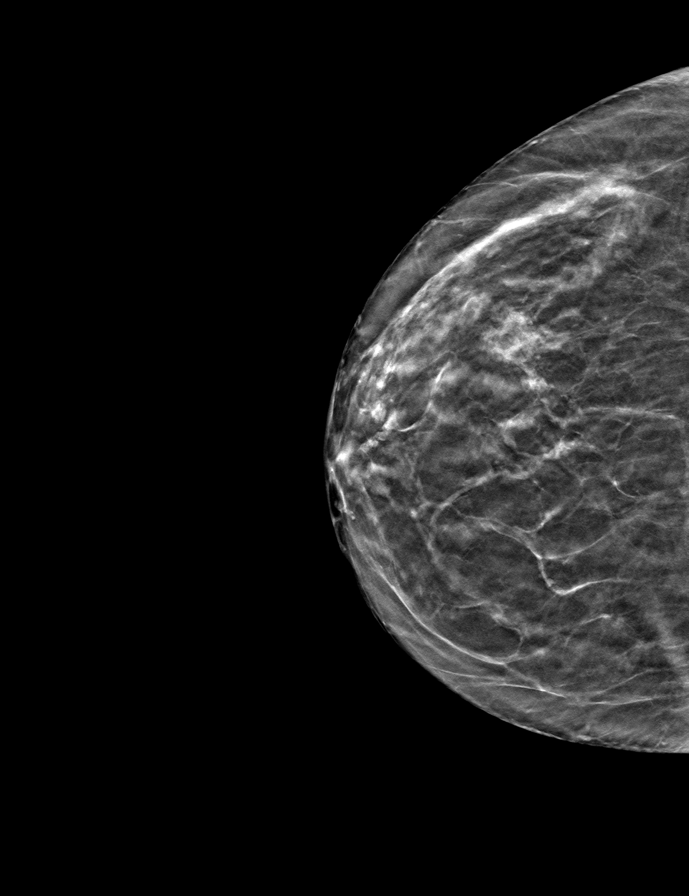

[9 of 24 positions shown; findings below may reference images not displayed]

ACR Breast Density Category b: There are scattered areas of
fibroglandular density.
FINDINGS: There are no findings suspicious for malignancy.
IMPRESSION: No mammographic evidence of malignancy. A result letter of this
screening mammogram will be mailed directly to the patient.

RECOMMENDATION:
Screening mammogram in one year. (Code:51-O-LD2)

BI-RADS CATEGORY  1: Negative.

## 2021-07-18 DIAGNOSIS — Z743 Need for continuous supervision: Secondary | ICD-10-CM | POA: Diagnosis not present

## 2021-07-18 DIAGNOSIS — M542 Cervicalgia: Secondary | ICD-10-CM | POA: Diagnosis not present

## 2021-07-18 DIAGNOSIS — R69 Illness, unspecified: Secondary | ICD-10-CM | POA: Diagnosis not present

## 2021-07-18 DIAGNOSIS — N390 Urinary tract infection, site not specified: Secondary | ICD-10-CM | POA: Diagnosis not present

## 2021-07-18 DIAGNOSIS — R9389 Abnormal findings on diagnostic imaging of other specified body structures: Secondary | ICD-10-CM | POA: Diagnosis not present

## 2021-07-18 DIAGNOSIS — R4182 Altered mental status, unspecified: Secondary | ICD-10-CM | POA: Diagnosis not present

## 2021-07-18 DIAGNOSIS — S199XXA Unspecified injury of neck, initial encounter: Secondary | ICD-10-CM | POA: Diagnosis not present

## 2021-07-18 DIAGNOSIS — G40909 Epilepsy, unspecified, not intractable, without status epilepticus: Secondary | ICD-10-CM | POA: Diagnosis not present

## 2021-07-18 DIAGNOSIS — R509 Fever, unspecified: Secondary | ICD-10-CM | POA: Diagnosis not present

## 2021-07-18 DIAGNOSIS — R569 Unspecified convulsions: Secondary | ICD-10-CM | POA: Diagnosis not present

## 2021-07-19 DIAGNOSIS — F0390 Unspecified dementia without behavioral disturbance: Secondary | ICD-10-CM | POA: Diagnosis not present

## 2021-07-19 DIAGNOSIS — G4089 Other seizures: Secondary | ICD-10-CM | POA: Diagnosis not present

## 2021-07-19 DIAGNOSIS — M6281 Muscle weakness (generalized): Secondary | ICD-10-CM | POA: Diagnosis not present

## 2021-07-21 DIAGNOSIS — Z789 Other specified health status: Secondary | ICD-10-CM | POA: Diagnosis not present

## 2021-07-21 DIAGNOSIS — Z6820 Body mass index (BMI) 20.0-20.9, adult: Secondary | ICD-10-CM | POA: Diagnosis not present

## 2021-07-21 DIAGNOSIS — Z299 Encounter for prophylactic measures, unspecified: Secondary | ICD-10-CM | POA: Diagnosis not present

## 2021-07-21 DIAGNOSIS — Z1331 Encounter for screening for depression: Secondary | ICD-10-CM | POA: Diagnosis not present

## 2021-07-21 DIAGNOSIS — E440 Moderate protein-calorie malnutrition: Secondary | ICD-10-CM | POA: Diagnosis not present

## 2021-07-21 DIAGNOSIS — R569 Unspecified convulsions: Secondary | ICD-10-CM | POA: Diagnosis not present

## 2021-07-21 DIAGNOSIS — I10 Essential (primary) hypertension: Secondary | ICD-10-CM | POA: Diagnosis not present

## 2021-07-21 DIAGNOSIS — F339 Major depressive disorder, recurrent, unspecified: Secondary | ICD-10-CM | POA: Diagnosis not present

## 2021-07-23 DIAGNOSIS — Z299 Encounter for prophylactic measures, unspecified: Secondary | ICD-10-CM | POA: Diagnosis not present

## 2021-07-23 DIAGNOSIS — N390 Urinary tract infection, site not specified: Secondary | ICD-10-CM | POA: Diagnosis not present

## 2021-07-23 DIAGNOSIS — I10 Essential (primary) hypertension: Secondary | ICD-10-CM | POA: Diagnosis not present

## 2021-07-23 DIAGNOSIS — E7800 Pure hypercholesterolemia, unspecified: Secondary | ICD-10-CM | POA: Diagnosis not present

## 2021-08-02 ENCOUNTER — Other Ambulatory Visit: Payer: Self-pay | Admitting: Neurology

## 2021-08-04 DIAGNOSIS — I10 Essential (primary) hypertension: Secondary | ICD-10-CM | POA: Diagnosis not present

## 2021-08-04 DIAGNOSIS — E785 Hyperlipidemia, unspecified: Secondary | ICD-10-CM | POA: Diagnosis not present

## 2021-08-16 ENCOUNTER — Telehealth: Payer: Self-pay | Admitting: Neurology

## 2021-08-16 NOTE — Telephone Encounter (Signed)
I spoke to her sister, [name]. Reports the patient had a breakthrough seizure [place]21[place] She was taking a shower and her sister heard her fall. Witnessed her arms/legs flailing. Event lasted about five minutes. EMS was called and she was transported to [facility] (ph: [phone]). No missed medications but had an active UTI. Her sister would like [place] to request the hospital records.   [name] on is on her DPR. However, she would prefer her sister not know about this call. She felt it better that the information came from the hospital at [facility].

## 2021-08-16 NOTE — Telephone Encounter (Signed)
Done

## 2021-08-16 NOTE — Telephone Encounter (Signed)
Pt's sister is asking for a call to discuss Recent seizure pt had on  [place]20 [place] beach

## 2021-08-19 ENCOUNTER — Encounter: Payer: Self-pay | Admitting: Neurology

## 2021-08-19 ENCOUNTER — Ambulatory Visit: Payer: PPO | Admitting: Neurology

## 2021-08-19 VITALS — BP 156/68 | HR 75 | Ht 61.0 in | Wt 105.0 lb

## 2021-08-19 DIAGNOSIS — R269 Unspecified abnormalities of gait and mobility: Secondary | ICD-10-CM

## 2021-08-19 DIAGNOSIS — E7800 Pure hypercholesterolemia, unspecified: Secondary | ICD-10-CM | POA: Insufficient documentation

## 2021-08-19 DIAGNOSIS — M47812 Spondylosis without myelopathy or radiculopathy, cervical region: Secondary | ICD-10-CM

## 2021-08-19 DIAGNOSIS — M5440 Lumbago with sciatica, unspecified side: Secondary | ICD-10-CM | POA: Insufficient documentation

## 2021-08-19 DIAGNOSIS — M7060 Trochanteric bursitis, unspecified hip: Secondary | ICD-10-CM | POA: Insufficient documentation

## 2021-08-19 DIAGNOSIS — J019 Acute sinusitis, unspecified: Secondary | ICD-10-CM | POA: Insufficient documentation

## 2021-08-19 DIAGNOSIS — R5383 Other fatigue: Secondary | ICD-10-CM | POA: Insufficient documentation

## 2021-08-19 DIAGNOSIS — E559 Vitamin D deficiency, unspecified: Secondary | ICD-10-CM | POA: Insufficient documentation

## 2021-08-19 DIAGNOSIS — Z87898 Personal history of other specified conditions: Secondary | ICD-10-CM | POA: Insufficient documentation

## 2021-08-19 DIAGNOSIS — A6920 Lyme disease, unspecified: Secondary | ICD-10-CM | POA: Insufficient documentation

## 2021-08-19 DIAGNOSIS — R569 Unspecified convulsions: Secondary | ICD-10-CM | POA: Diagnosis not present

## 2021-08-19 DIAGNOSIS — M549 Dorsalgia, unspecified: Secondary | ICD-10-CM | POA: Insufficient documentation

## 2021-08-19 DIAGNOSIS — F0280 Dementia in other diseases classified elsewhere without behavioral disturbance: Secondary | ICD-10-CM | POA: Insufficient documentation

## 2021-08-19 DIAGNOSIS — L821 Other seborrheic keratosis: Secondary | ICD-10-CM | POA: Insufficient documentation

## 2021-08-19 DIAGNOSIS — T148XXA Other injury of unspecified body region, initial encounter: Secondary | ICD-10-CM | POA: Insufficient documentation

## 2021-08-19 DIAGNOSIS — E2839 Other primary ovarian failure: Secondary | ICD-10-CM | POA: Insufficient documentation

## 2021-08-19 DIAGNOSIS — H209 Unspecified iridocyclitis: Secondary | ICD-10-CM | POA: Insufficient documentation

## 2021-08-19 DIAGNOSIS — Z8739 Personal history of other diseases of the musculoskeletal system and connective tissue: Secondary | ICD-10-CM | POA: Insufficient documentation

## 2021-08-19 DIAGNOSIS — R0989 Other specified symptoms and signs involving the circulatory and respiratory systems: Secondary | ICD-10-CM | POA: Insufficient documentation

## 2021-08-19 DIAGNOSIS — I10 Essential (primary) hypertension: Secondary | ICD-10-CM | POA: Insufficient documentation

## 2021-08-19 DIAGNOSIS — Z7189 Other specified counseling: Secondary | ICD-10-CM | POA: Insufficient documentation

## 2021-08-19 DIAGNOSIS — IMO0001 Reserved for inherently not codable concepts without codable children: Secondary | ICD-10-CM | POA: Insufficient documentation

## 2021-08-19 DIAGNOSIS — R002 Palpitations: Secondary | ICD-10-CM | POA: Insufficient documentation

## 2021-08-19 DIAGNOSIS — R059 Cough, unspecified: Secondary | ICD-10-CM | POA: Insufficient documentation

## 2021-08-19 MED ORDER — CLOBAZAM 2.5 MG/ML PO SUSP: 2.5000 mg | Freq: Every day | ORAL | 3 refills | Status: DC

## 2021-08-19 MED ORDER — LAMOTRIGINE ER 25 MG PO TB24: 50.0000 mg | ORAL_TABLET | Freq: Every day | ORAL | 11 refills | Status: DC

## 2021-08-19 NOTE — Progress Notes (Signed)
PATIENT: Sheri Valencia DOB: Apr 09, 1945  REASON FOR VISIT: Follow up for seizures HISTORY FROM: Patient PRIMARY NEUROLOGIST: Dr.Michal Callicott   ASSESSMENT AND PLAN 76 y.o. year old female   Complex partial seizure with secondary generalization  Most recurrent seizure Jul 18, 2021, while compliant with her lamotrigine ER 250 mg daily, Keppra 500 mg twice a day, previously level was within therapeutic range,  Complains of worsening depression anxiety, could not tolerate Wellbutrin, also worried about Wellbutrin we will lower seizure threshold, she is no longer taking it  Keep lamotrigine ER 250 mg every night, tapering off Keppra, add on lamotrigine ER 25 mg 2 tablets every morning,  Am concerned that lamotrigine single agent may not be enough for her seizure control, add on low-dose Onfi 2.5 mg every night  Repeat EEG   Known history of cervical spondylosis  Worsening neck pain, unbalanced gait since procedure in May  Hyperreflexia on examination, worried about worsening cervical spondylitic myelopathy  Repeat MRI of cervical spine  Return to clinic with nurse practitioner in 6 weeks   DIAGNOSTIC DATA (LABS, IMAGING, TESTING) - I reviewed patient records, labs, notes, testing and imaging myself where available.  HISTORY  Sheri Valencia is a 76 years old right-handed Caucasian female,followed  for complex partial seizure with secondary generalization. She has a history of complex partial seizure with secondary generalization since age 36,  presented with rising sensation, then passed out followed by generalized motor seizure, last seizure was in 2002, she was treated with Lamictal 200 mg twice a day, continue to have recurrent seizure, Keppra 500 mg 2 tablets twice a day was added on, no recurrent seizure since. 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 Other levels alsodemonstrate significant stenosis.. She denies significant neck pain She has mild unsteady gait, always contributed to her left hip problem,  She was evaluated recently  by Dr. Channing Valencia, neurosurgeon, she follows up yearly   She returns for reevaluation and refills. She is on Aricept by PCP Dr. Sherryll Valencia.    UPDATE Jul 05 2017:  I was able to review the letter from her  Sister Sheri Valencia, she is accompanied by her friend Sheri Valencia at today's clinical visit,   "From her sister's letter: Sheri Valencia continue has memory problem, easily gets confused, gets frustrated easily, but deny her problem, there was also description of paranoid, emotional incontinence, forgetful, poor decision-making process for her health, irrational spell, recent purchase of a new car, but difficult to operating her new vehicle, impulsive action, got lost while driving, difficulty working with her computer, loose piece constantly, difficulty managing her bills, ongoing since 2017, progressively getting worse"   Sheri Valencia drove her here today, she has lived [place] for 50 years, 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   She likes to walk, reading.  She lives in a codominum.    She has not have seizure for few years. 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   UPDATE October 26 2017: She was able to taper off Keppra 500 mg twice daily, only on lamotrigine 200 mg twice a day, doing better, along with Namenda 10 mg twice a day, her memory seems to improve will stabilize, care of her elderly aunt,   We personally reviewed MRI of the brain in May 2019, mild generalized atrophy no acute abnormality,   EEG was normal    Laboratory evaluation in May 2019 showed normal TSH, B12, RPR, CBC, CMP showed mild elevated 1.25, with GFR of 44, Keppra level was 66, lamotrigine level was 22, she has no signs of toxicity, the level was not trough level   Virtual Visit via Video on Jul 11, 2018   She is with her sister Sheri Valencia at visit.  She had no recurrent seizure, taking lamotrigine 200 mg twice a day, Keppra 500 mg twice a day, he complains of increased confusion, sometimes unsteady gait, fell few times, sister also concerned about worsening memory loss, had few incident of mis- handling her debit card,   Previously we have tried to taper off Keppra 500 mg 2 tablets twice a day, because her underweight, has not had recurrent seizure for many years, but she is concerned about potential recurrent seizure, put herself back on Keppra 1 tablets twice a day,   Laboratory evaluations on Jul 05, 2017, normal TSH 1.9, lamotrigine 22.7, Keppra 66.5, normal B12, CBC hemoglobin of 11.1, creatinine of 1.25, potassium of 5.4   Update October 31, 2018: She is with her sister Sheri Valencia at today's visit, she continue to take lamotrigine 200 mg twice a day, has no recurrent seizure, she has worsening memory loss, excessive daytime sleepiness, fatigue, continue have  depression, taking Zoloft 100 mg daily, she complains of change of taste, has decreased appetite, with some weight loss,   Laboratory evaluations on Jul 12, 2018 showed lamotrigine level was 74,   UPDATE Apr 05 2019: She is accompanied by her Sister Sheri Valencia at today's clinical visit, before Christmas, she complains of few days history of poor appetite, frequent diarrhea, on March 03, 2019, she woke up on the floor, has difficulty getting up from the floor, had a transient loss of consciousness," but felt like it is not a seizure", she called ambulance, was brought to the emergency room at [facility], lamotrigine level was 30, I personally reviewed CT head without contrast showed no acute findings UA showed evidence of UTI, her symptoms improved with hydration,   However, she remained confused, weak, difficulty walking was [facility] on March 08, 2019, personally reviewed record, CT head showed no acute abnormality, CT of cervical spine showed multilevel degenerative changes, most severe at C5-6, C6 and 7,   She was treated for UTI, planning on to take Macrobid 100 mg daily for 1 month, there was no recurrent seizure activity, she is back on her home regimen of lamotrigine ER 200 mg 2 tablets every night   She does complains of low back pain, continue to feel generalized weakness, gait abnormality, "I have to think about how to walk  my leg", also worsening urinary urgency, incontinence   Laboratory evaluation showed mild anemia hemoglobin of 10.8 CMP showed mildly low potassium 3.4, decreased albumin 3.4   She now lives with her sister temporarily, planning on for short-term assisted living placement due to her continued gait abnormality, worsening confusion,   UPDATE May 14 2019: She is with her sister at today's clinical visit, she is currently lives at at [place], she had a significant improvement, no gait abnormality, no recurrent seizure, is planning on going home soon.    Acute onset of confusion in December 2020, is most likely related to her UTI, dehydration, polypharmacy treatment, depression anxiety, Lexapro 10 mg daily has been helpful.   We personally reviewed MRI of cervical spine on April 29, 2019: Multilevel degenerative changes, most severe at C5-6, with rightward disc protrusion, borderline mild spinal stenosis, no cord signal changes, severe right foraminal stenosis,   UPDATE Sept 13 2022: She had a breakthrough seizure on November 30, 2019, and May 02, 2020, both seizure happened while her sister was driving, she suddenly become required, forceful head turning to the right, neck hyperextension, body tonic-clonic movement,  She is now back on lamotrigine XR 250 mg every night, Keppra 500 mg twice a day was added on, she tolerated it well, there was no recurrent seizure  UPDATE August 19 2021: [facility] for [facility], she had a seizure on Jul 18, 2021, she finished taking a shower, stepping out, and loss of consciousness  Her sister heard a thump, but could not open the door, she was able to pick through the opening, patient had body jerking movement,  Paramedic was called, was treated at local hospital,   I reviewed her hospital record, blood pressure 173/77, temperature 98.1 pulse 100, laboratory evaluation showed mild elevated WBC 10.4, hemoglobin 11.4, creatinine 1, glucose 134 otherwise normal CMP, urine showed WBC 26-50, moderate epithelial cells,  CT head showed no acute intracranial pathology, CT angiogram showed 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, otherwise patent neck vasculature, no intracranial large vessel occlusion  CT of cervical region showed no fracture,, multilevel level of degenerative disease, grade 1 anterolisthesis of C4 on 5,  EKG showed normal sinus rhythm  She was treated with azithromycin, ceftriaxone for UTI  Last antiepileptic level was in September 2022 with current dose,  Keppra level was 39.8, lamotrigine level was 13.7 She complains of worsening depression anxiety, crying spells since seizure, was given Wellbutrin, she worried about the side effect of lowered seizure threshold, is not taking it anymore,  In addition she complains of worsening gait abnormality, urinary frequency urgency since recurrent seizure and fall in May 2023  REVIEW OF SYSTEMS: Out of a complete 14 system review of symptoms, the patient complains only of the following symptoms, and all other reviewed systems are negative.  See HPI  PHYSICAL EXAM  Vitals:   08/19/21 1025  BP: (!) 156/68  Pulse: 75  Weight: 105 lb (47.6 kg)  Height: 5\' 1"  (1.549 m)   Body mass index is 19.84 kg/m.  PHYSICAL EXAMNIATION:  Gen: NAD, conversant, well nourised, well groomed                     Cardiovascular: Regular rate rhythm, no peripheral edema, warm, nontender. Eyes: Conjunctivae clear without exudates or hemorrhage Neck: Supple, no carotid bruits. Pulmonary: Clear to auscultation bilaterally   NEUROLOGICAL EXAM:  MENTAL STATUS: Speech/cognition: Awake, alert oriented to history taking and casual conversation  CRANIAL NERVES: CN II: Visual fields are full to confrontation.  Pupils are round equal and briskly reactive to light. CN III, IV, VI: extraocular movement are normal. No ptosis. CN V: Facial sensation is intact to pinprick in all 3 divisions bilaterally. Corneal responses are intact.  CN VII: Face is symmetric with normal eye closure and smile. CN VIII: Hearing is normal to casual conversation CN IX, X: Palate elevates symmetrically. Phonation is normal. CN XI: Head turning and shoulder shrug are intact CN XII: Tongue is midline with normal movements and no atrophy.  MOTOR: There is no pronator drift of out-stretched arms. Muscle bulk and tone are normal. Muscle strength is normal.  REFLEXES: Reflexes are 2+ and symmetric at the biceps, triceps, 3/3 knees, and ankles.  Plantar responses are extensor bilaterally  SENSORY: Intact to light touch, pinprick, positional and vibratory sensation are intact in fingers and toes.  COORDINATION: No truncal ataxia, no limb dysmetria  GAIT/STANCE: Need push-up to get up from seated position, cautious  ALLERGIES: Allergies  Allergen Reactions   Ativan [Lorazepam]     confusion    HOME MEDICATIONS: Outpatient Medications Prior to Visit  Medication Sig Dispense Refill   alendronate (FOSAMAX) 70 MG tablet Take 70 mg by mouth once a week.     aspirin EC 81 MG tablet Take 81 mg by mouth daily.     LamoTRIgine 250 MG TB24 24 hour tablet Take 250 mg by mouth at bedtime. 90 tablet 4   levETIRAcetam (KEPPRA) 500 MG tablet Take 1 tablet (500 mg total) by mouth 2 (two) times daily. 180 tablet 4   memantine (NAMENDA) 10 MG tablet Take 1 tablet (10 mg total) by mouth 2 (two) times daily. 180 tablet 3   rosuvastatin (CRESTOR) 5 MG tablet Take 5 mg by mouth daily.     sertraline (ZOLOFT) 100 MG tablet Take 100 mg by mouth daily.     No facility-administered medications prior to visit.    PAST MEDICAL HISTORY: Past Medical History:  Diagnosis Date   Memory loss    Seizure (HCC)     PAST SURGICAL HISTORY: Past Surgical History:  Procedure Laterality Date   APPENDECTOMY     TUBAL LIGATION      FAMILY HISTORY: Family History  Problem Relation Age of Onset   Pneumonia Mother    Heart attack Maternal Grandmother    Stroke Maternal Grandmother    Suicidality Brother    Aneurysm Maternal Aunt    Breast cancer Sister        04/2015  Stage I    SOCIAL HISTORY: Social History   Socioeconomic History   Marital status: Widowed    Spouse name: Not on file   Number of children: 0   Years of education: college   Highest education level: Not on file  Occupational History    Employer: RETIRED    Comment: retired  Tobacco Use   Smoking status: Never   Smokeless tobacco: Never  Substance and Sexual Activity    Alcohol use: Yes    Alcohol/week: 1.0 standard drink of alcohol    Types: 1 Glasses of wine per week   Drug use: No   Sexual activity: Not on file  Other Topics Concern   Not on file  Social History Narrative   Patient lives at home alone and she is widowed. Retired.   College education    Caffeine one cup daily.         Social Determinants of Health  Financial Resource Strain: Not on file  Food Insecurity: Not on file  Transportation Needs: Not on file  Physical Activity: Not on file  Stress: Not on file  Social Connections: Not on file  Intimate Partner Violence: Not on file     Levert Feinstein, M.D. Ph.D.  [facility] [address] [place], [place] [address] Phone: [phone] Fax:      [phone]

## 2021-08-19 NOTE — Patient Instructions (Signed)
Week Lamotrigine ER 250mg  every night  Lamotrigine ER 25mg  every morning Keppra 500mg  twice a day Onfi 2.5mg  at night  1st week 1 2  1/1 0  2nd week 1 2 0/1 1  3rd week 1 2 0/0 1  4th week  1. 2 0/0 1

## 2021-08-20 ENCOUNTER — Telehealth: Payer: Self-pay | Admitting: Neurology

## 2021-08-20 NOTE — Telephone Encounter (Signed)
[facility] adv [facility] sent to GI they will call the patient to schedule ?

## 2021-08-24 ENCOUNTER — Ambulatory Visit: Payer: PPO | Admitting: Neurology

## 2021-08-24 DIAGNOSIS — M47812 Spondylosis without myelopathy or radiculopathy, cervical region: Secondary | ICD-10-CM

## 2021-08-24 DIAGNOSIS — R569 Unspecified convulsions: Secondary | ICD-10-CM

## 2021-08-24 DIAGNOSIS — R269 Unspecified abnormalities of gait and mobility: Secondary | ICD-10-CM

## 2021-08-26 DIAGNOSIS — F0280 Dementia in other diseases classified elsewhere without behavioral disturbance: Secondary | ICD-10-CM | POA: Diagnosis not present

## 2021-08-26 DIAGNOSIS — Z6820 Body mass index (BMI) 20.0-20.9, adult: Secondary | ICD-10-CM | POA: Diagnosis not present

## 2021-08-26 DIAGNOSIS — Z299 Encounter for prophylactic measures, unspecified: Secondary | ICD-10-CM | POA: Diagnosis not present

## 2021-08-26 DIAGNOSIS — R569 Unspecified convulsions: Secondary | ICD-10-CM | POA: Diagnosis not present

## 2021-08-26 DIAGNOSIS — G309 Alzheimer's disease, unspecified: Secondary | ICD-10-CM | POA: Diagnosis not present

## 2021-08-26 DIAGNOSIS — I10 Essential (primary) hypertension: Secondary | ICD-10-CM | POA: Diagnosis not present

## 2021-08-27 NOTE — Procedures (Signed)
   HISTORY: 76 year old female with seizure, memory loss  TECHNIQUE:  This is a routine 16 channel EEG recording with one channel devoted to a limited EKG recording.  It was performed during wakefulness, drowsiness and asleep.  Hyperventilation and photic stimulation were performed as activating procedures.  There are minimum muscle and movement artifact noted.  Upon maximum arousal, posterior dominant waking rhythm consistent of low amplitude rhythmic alpha range activity. Activities are symmetric over the bilateral posterior derivations and attenuated with eye opening.  Hyperventilation produced mild/moderate buildup with higher amplitude and the slower activities noted.  Photic stimulation did not alter the tracing.  During EEG recording, patient developed drowsiness and no deeper stage of sleep was achieved  During EEG recording, there was no epileptiform discharge noted.  EKG demonstrate sinus rhythm, with heart rate of 72 bpm  CONCLUSION: This is a  normal awake EEG.  There is no electrodiagnostic evidence of epileptiform discharge.  Levert Feinstein, M.D. Ph.D.  [facility] [address] [place], [place] [address] Phone: [phone] Fax:      [phone]

## 2021-08-30 ENCOUNTER — Ambulatory Visit
Admission: RE | Admit: 2021-08-30 | Discharge: 2021-08-30 | Disposition: A | Discharge status: Home / Self Care | Payer: PPO | Source: Ambulatory Visit | Attending: Neurology | Admitting: Neurology

## 2021-08-30 DIAGNOSIS — R569 Unspecified convulsions: Secondary | ICD-10-CM | POA: Diagnosis not present

## 2021-08-30 DIAGNOSIS — R269 Unspecified abnormalities of gait and mobility: Secondary | ICD-10-CM

## 2021-08-30 DIAGNOSIS — M47812 Spondylosis without myelopathy or radiculopathy, cervical region: Secondary | ICD-10-CM | POA: Diagnosis not present

## 2021-08-31 ENCOUNTER — Telehealth: Payer: Self-pay | Admitting: Neurology

## 2021-08-31 NOTE — Telephone Encounter (Signed)
I spoke with the patient and provided results. She verbalized understanding of the findings and expressed appreciation for the call. All questions answered.

## 2021-08-31 NOTE — Telephone Encounter (Signed)
Please call patient, MRI of cervical spine showed multilevel degenerative changes, no evidence of spinal cord compression, but there is variable degree of foraminal narrowing, most noticeable at C5-6 right side, and C6-7     IMPRESSION: This MRI of the cervical spine without contrast shows the following: 1.  At C3-C4, there is mild spinal stenosis and moderate bilateral foraminal narrowing but no nerve root compression. 2.  At C4-C5, there is minimal anterolisthesis and other degenerative changes causing moderate left foraminal narrowing but no spinal stenosis or nerve root compression. 3.  At C5-C6, there is a right paramedian disc protrusion and other degenerative change causing mild spinal stenosis and severe right foraminal narrowing and moderate left foraminal narrowing.  There is potential for right C6 nerve root compression. 4.  At C6-C7, there are degenerative changes causing mild spinal stenosis and moderately severe right greater than left foraminal narrowing.  There is potential for C7 nerve root compression to either side. 5.  Degenerative changes are stable compared to the 04/29/2019 MRI.

## 2021-09-02 DIAGNOSIS — S81819A Laceration without foreign body, unspecified lower leg, initial encounter: Secondary | ICD-10-CM | POA: Diagnosis not present

## 2021-09-02 DIAGNOSIS — Z299 Encounter for prophylactic measures, unspecified: Secondary | ICD-10-CM | POA: Diagnosis not present

## 2021-09-02 DIAGNOSIS — I10 Essential (primary) hypertension: Secondary | ICD-10-CM | POA: Diagnosis not present

## 2021-09-09 ENCOUNTER — Telehealth: Payer: Self-pay | Admitting: Neurology

## 2021-09-09 NOTE — Telephone Encounter (Signed)
Pt is calling and have some question about her medication, Pt would like a call back from the nurse.

## 2021-09-13 ENCOUNTER — Other Ambulatory Visit: Payer: Self-pay | Admitting: *Deleted

## 2021-09-13 NOTE — Telephone Encounter (Signed)
I spoke to the patient. States is calling all her doctors just to confirm she is taking her medications correctly.   Note from 08/19/21 visit:   Keep lamotrigine ER 250 mg every night, tapering off Keppra, add on lamotrigine ER 25 mg 2 tablets every morning,  Am concerned that lamotrigine single agent may not be enough for her seizure control, add on low-dose Onfi 2.5 mg every night. ____________________________________ She had her bottles out. Confirmed the information above correctly. She is also taking memantine 10mg , one tab BID from our office.

## 2021-09-16 DIAGNOSIS — S81819A Laceration without foreign body, unspecified lower leg, initial encounter: Secondary | ICD-10-CM | POA: Diagnosis not present

## 2021-09-16 DIAGNOSIS — Z299 Encounter for prophylactic measures, unspecified: Secondary | ICD-10-CM | POA: Diagnosis not present

## 2021-09-16 DIAGNOSIS — I10 Essential (primary) hypertension: Secondary | ICD-10-CM | POA: Diagnosis not present

## 2021-09-28 NOTE — Progress Notes (Addendum)
Patient: Sheri Valencia Date of Birth: 1945/11/20  Reason for Visit: Follow up History from: Patient, sister Primary Neurologist: Dr. Terrace Arabia   ASSESSMENT AND PLAN 76 y.o. year old female   1.  Complex partial seizure with secondary generalization -No recent seizure since Jul 18, 2021 (on Keppra 500 mg twice a day, Lamictal XR 250 mg daily) -Continue Lamictal XR 50 mg a.m., 250 mg p.m., Onfi 2.5 mg at bedtime (taper off Keppra June 2023) -Check Lamictal level today -EEG was normal -No driving until seizure-free 6 months  2.  History of cervical spondylosis -Denies any worsening neck pain, gait or balance -MRI cervical spine 08/30/21 degenerative changes throughout, most at C5-C6 with severe right foraminal narrowing, potential for right C6 nerve root compression; also C6-C7 severe right greater than left foraminal narrowing, potential for C7 nerve root compression -Monitor symptoms, signs of worsening  Addendum 01/24/22 SS: RN called pharmacy, no refills left on Onfi, I sent new script in.   Meds ordered this encounter  Medications   cloBAZam (ONFI) 2.5 MG/ML solution    Sig: Take 1 mL (2.5 mg total) by mouth at bedtime.    Dispense:  30 mL    Refill:  5    HISTORY  Sheri Valencia is a 76 years old right-handed Caucasian female,followed  for complex partial seizure with secondary generalization. She has a history of complex partial seizure with secondary generalization since age 71,  presented with rising sensation, then passed out followed by generalized motor seizure, last seizure was in 2002, she was treated with Lamictal 200 mg twice a day, continue to have recurrent seizure, Keppra 500 mg 2 tablets twice a day was added on, no recurrent seizure since. 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 Other levels alsodemonstrate significant stenosis.. She denies significant neck pain She has mild unsteady gait, always contributed to her left hip problem,  She was evaluated recently  by Dr. Channing Mutters, neurosurgeon, she follows up yearly   She returns for reevaluation and refills. She is on Aricept by PCP Dr. Sherryll Valencia.    UPDATE Jul 05 2017:  I was able to review the letter from her  Sister Sheri Valencia, she is accompanied by her friend Sheri Valencia at today's clinical visit,   "From her sister's letter: Sheri Valencia continue has memory problem, easily gets confused, gets frustrated easily, but deny her problem, there was also description of paranoid, emotional incontinence, forgetful, poor decision-making process for her health, irrational spell, recent purchase of a new car, but difficult to operating her new vehicle, impulsive action, got lost while driving, difficulty working with her computer, loose piece constantly, difficulty managing her bills, ongoing since 2017, progressively getting worse"   Sheri Valencia drove her here today, she has lived [place] for 50 years, 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   She likes to walk, reading.  She lives in a codominum.    She has not have seizure for few years. 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   UPDATE October 26 2017: She was able to taper off Keppra 500 mg twice daily, only on lamotrigine 200 mg twice a day, doing better, along with Namenda 10 mg twice a day, her memory seems to improve will stabilize, care of her elderly aunt,   We personally reviewed MRI of the brain in May 2019, mild generalized atrophy no acute abnormality,   EEG was normal    Laboratory evaluation in May 2019 showed normal TSH, B12, RPR, CBC, CMP showed mild elevated 1.25, with GFR of 44, Keppra level was 66, lamotrigine level was 22, she has no signs of toxicity, the level was not trough level   Virtual Visit via Video on Jul 11, 2018   She is with her sister Sheri Valencia at visit.  She had no recurrent seizure, taking lamotrigine 200 mg twice a day, Keppra 500 mg twice a day, he complains of increased confusion, sometimes unsteady gait, fell few times, sister also concerned about worsening memory loss, had few incident of mis- handling her debit card,   Previously we have tried to taper off Keppra 500 mg 2 tablets twice a day, because her underweight, has not had recurrent seizure for many years, but she is concerned about potential recurrent seizure, put herself back on Keppra 1 tablets twice a day,   Laboratory evaluations on Jul 05, 2017, normal TSH 1.9, lamotrigine 22.7, Keppra 66.5, normal B12, CBC hemoglobin of 11.1, creatinine of 1.25, potassium of 5.4   Update October 31, 2018: She is with her sister Sheri Valencia at today's visit, she continue to take lamotrigine 200 mg twice a day, has no recurrent seizure, she has worsening memory loss, excessive daytime sleepiness, fatigue, continue have depression, taking Zoloft 100 mg daily,  she complains of change of taste, has decreased appetite, with some weight loss,   Laboratory evaluations on Jul 12, 2018 showed lamotrigine level was 71,   UPDATE Apr 05 2019: She is accompanied by her Sister [name] at today's clinical visit, before Christmas, she complains of few days history of poor appetite, frequent diarrhea, on March 03, 2019, she woke up on the floor, has difficulty getting up from the floor, had a transient loss of consciousness," but felt like it is not a seizure", she called ambulance, was brought to the emergency room at [facility], lamotrigine level was 30, I personally reviewed CT head without contrast showed no acute findings UA showed evidence of UTI, her symptoms improved with hydration,   However, she remained confused, weak, difficulty walking was admitted to [facility] on March 08, 2019, personally reviewed record, CT head showed no acute abnormality, CT of cervical spine showed multilevel degenerative changes, most severe at C5-6, C6 and 7,   She was treated for UTI, planning on to take Macrobid 100 mg daily for 1 month, there was no recurrent seizure activity, she is back on her home regimen of lamotrigine ER 200 mg 2 tablets every night   She does complains of low back pain, continue to feel generalized weakness,  gait abnormality, "I have to think about how to walk my leg", also worsening urinary urgency, incontinence   Laboratory evaluation showed mild anemia hemoglobin of 10.8 CMP showed mildly low potassium 3.4, decreased albumin 3.4   She now lives with her sister temporarily, planning on for short-term assisted living placement due to her continued gait abnormality, worsening confusion,   UPDATE May 14 2019: She is with her sister at today's clinical visit, she is currently lives at at [place], she had a significant improvement, no gait abnormality, no recurrent seizure, is planning on going home soon.   Acute onset of confusion in December  2020, is most likely related to her UTI, dehydration, polypharmacy treatment, depression anxiety, Lexapro 10 mg daily has been helpful.   We personally reviewed MRI of cervical spine on April 29, 2019: Multilevel degenerative changes, most severe at C5-6, with rightward disc protrusion, borderline mild spinal stenosis, no cord signal changes, severe right foraminal stenosis,   UPDATE Sept 13 2022: She had a breakthrough seizure on November 30, 2019, and May 02, 2020, both seizure happened while her sister was driving, she suddenly become required, forceful head turning to the right, neck hyperextension, body tonic-clonic movement,  She is now back on lamotrigine XR 250 mg every night, Keppra 500 mg twice a day was added on, she tolerated it well, there was no recurrent seizure   UPDATE August 19 2021: [facility] for vacation, she had a seizure on Jul 18, 2021, she finished taking a shower, stepping out, and loss of consciousness  Her sister heard a thump, but could not open the door, she was able to pick through the opening, patient had body jerking movement,  Paramedic was called, was treated at local hospital,   I reviewed her hospital record, blood pressure 173/77, temperature 98.1 pulse 100, laboratory evaluation showed mild elevated WBC 10.4, hemoglobin 11.4, creatinine 1, glucose 134 otherwise normal CMP, urine showed WBC 26-50, moderate epithelial cells,  CT head showed no acute intracranial pathology, CT angiogram showed 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, otherwise patent neck vasculature, no intracranial large vessel occlusion  CT of cervical region showed no fracture,, multilevel level of degenerative disease, grade 1 anterolisthesis of C4 on 5,  EKG showed normal sinus rhythm  She was treated with azithromycin, ceftriaxone for UTI   Last antiepileptic level was in September 2022 with current dose, Keppra level was 39.8, lamotrigine level  was 13.7 She complains of worsening depression anxiety, crying spells since seizure, was given Wellbutrin, she worried about the side effect of lowered seizure threshold, is not taking it anymore,  In addition she complains of worsening gait abnormality, urinary frequency urgency since recurrent seizure and fall in May 2023  Update 09/29/21 SS: Here today with her sister, no recurrent seizure, on Lamictal XR 50 mg AM/250 mg PM, Onfi 2.5 mg at bedtime.  Does have fatigue, takes a nap, not convinced medication related.  Sister thinks she is doing great.  Denies significant neck pain, has urinary urgency, no falls, or gait changes.  EEG was normal 08/24/21, MRI of cervical spine showed multilevel degenerative changes, at C5-6 severe right foraminal narrowing and moderate left, potential for right C6 nerve root compression, also C7 nerve root compression potential.  IMPRESSION: This MRI of the cervical spine without contrast shows the following: 1.  At C3-C4, there is mild spinal stenosis and moderate bilateral foraminal narrowing but no nerve root compression. 2.  At C4-C5, there is  minimal anterolisthesis and other degenerative changes causing moderate left foraminal narrowing but no spinal stenosis or nerve root compression. 3.  At C5-C6, there is a right paramedian disc protrusion and other degenerative change causing mild spinal stenosis and severe right foraminal narrowing and moderate left foraminal narrowing.  There is potential for right C6 nerve root compression. 4.  At C6-C7, there are degenerative changes causing mild spinal stenosis and moderately severe right greater than left foraminal narrowing.  There is potential for C7 nerve root compression to either side. 5.  Degenerative changes are stable compared to the 04/29/2019 MRI.   REVIEW OF SYSTEMS: Out of a complete 14 system review of symptoms, the patient complains only of the following symptoms, and all other reviewed systems are  negative.  See HPI  ALLERGIES: Allergies  Allergen Reactions   Ativan [Lorazepam]     confusion    HOME MEDICATIONS: Outpatient Medications Prior to Visit  Medication Sig Dispense Refill   alendronate (FOSAMAX) 70 MG tablet Take 70 mg by mouth once a week.     aspirin EC 81 MG tablet Take 81 mg by mouth daily.     cloBAZam (ONFI) 2.5 MG/ML solution Take 1 mL (2.5 mg total) by mouth at bedtime. 120 mL 3   LamoTRIgine (LAMICTAL XR) 25 MG TB24 24 hour tablet Take 2 tablets (50 mg total) by mouth daily. 60 tablet 11   LamoTRIgine 250 MG TB24 24 hour tablet Take 250 mg by mouth at bedtime. 90 tablet 4   memantine (NAMENDA) 10 MG tablet Take 1 tablet (10 mg total) by mouth 2 (two) times daily. 180 tablet 3   rosuvastatin (CRESTOR) 5 MG tablet Take 5 mg by mouth daily.     sertraline (ZOLOFT) 100 MG tablet Take 100 mg by mouth daily.     No facility-administered medications prior to visit.    PAST MEDICAL HISTORY: Past Medical History:  Diagnosis Date   Memory loss    Seizure (HCC)     PAST SURGICAL HISTORY: Past Surgical History:  Procedure Laterality Date   APPENDECTOMY     TUBAL LIGATION      FAMILY HISTORY: Family History  Problem Relation Age of Onset   Pneumonia Mother    Heart attack Maternal Grandmother    Stroke Maternal Grandmother    Suicidality Brother    Aneurysm Maternal Aunt    Breast cancer Sister        04/2015  Stage I    SOCIAL HISTORY: Social History   Socioeconomic History   Marital status: Widowed    Spouse name: Not on file   Number of children: 0   Years of education: college   Highest education level: Not on file  Occupational History    Employer: RETIRED    Comment: retired  Tobacco Use   Smoking status: Never   Smokeless tobacco: Never  Substance and Sexual Activity   Alcohol use: Yes    Alcohol/week: 1.0 standard drink of alcohol    Types: 1 Glasses of wine per week   Drug use: No   Sexual activity: Not on file  Other Topics  Concern   Not on file  Social History Narrative   Patient lives at home alone and she is widowed. Retired.   College education    Caffeine one cup daily.         Social Determinants of Health   Financial Resource Strain: Not on file  Food Insecurity: Not on file  Transportation Needs:  Not on file  Physical Activity: Not on file  Stress: Not on file  Social Connections: Not on file  Intimate Partner Violence: Not on file   PHYSICAL EXAM  Vitals:   09/29/21 1431  BP: (!) 145/72  Pulse: 79  Weight: 106 lb (48.1 kg)  Height: 5\' 1"  (1.549 m)   Body mass index is 20.03 kg/m.  Generalized: Well developed, in no acute distress  Neurological examination  Mentation: Alert oriented to time, place, history taking. Follows all commands speech and language fluent Cranial nerve II-XII: Pupils were equal round reactive to light. Extraocular movements were full, visual field were full on confrontational test. Facial sensation and strength were normal.  Head turning and shoulder shrug  were normal and symmetric. Motor: The motor testing reveals 5 over 5 strength of all 4 extremities. Good symmetric motor tone is noted throughout.  Sensory: Sensory testing is intact to soft touch on all 4 extremities. No evidence of extinction is noted.  Coordination: Cerebellar testing reveals good finger-nose-finger and heel-to-shin bilaterally.  Gait and station: Gait is normal.  Slightly wide-based. Reflexes: Deep tendon reflexes are symmetric increased at the knees  DIAGNOSTIC DATA (LABS, IMAGING, TESTING) - I reviewed patient records, labs, notes, testing and imaging myself where available.  Lab Results  Component Value Date   WBC 9.1 12/04/2019   HGB 11.4 12/04/2019   HCT 34.5 12/04/2019   MCV 87 12/04/2019   PLT 307 12/04/2019      Component Value Date/Time   NA 137 12/04/2019 0849   K 5.3 (H) 12/04/2019 0849   CL 101 12/04/2019 0849   CO2 24 12/04/2019 0849   GLUCOSE 94 12/04/2019 0849    GLUCOSE 122 (H) 03/03/2019 0916   BUN 12 12/04/2019 0849   CREATININE 1.05 (H) 12/04/2019 0849   CALCIUM 9.6 12/04/2019 0849   PROT 7.1 12/04/2019 0849   ALBUMIN 4.3 12/04/2019 0849   AST 33 12/04/2019 0849   ALT 23 12/04/2019 0849   ALKPHOS 123 (H) 12/04/2019 0849   BILITOT 0.2 12/04/2019 0849   GFRNONAA 53 (L) 12/04/2019 0849   GFRAA 61 12/04/2019 0849   No results found for: "CHOL", "HDL", "LDLCALC", "LDLDIRECT", "TRIG", "CHOLHDL" No results found for: "HGBA1C" Lab Results  Component Value Date   VITAMINB12 865 07/05/2017   Lab Results  Component Value Date   TSH 3.360 11/17/2020    Margie Ege, AGNP-C, DNP 09/29/2021, 4:21 PM [facility] [address] [place], [place] [address] [phone]

## 2021-09-29 ENCOUNTER — Ambulatory Visit: Payer: PPO | Admitting: Neurology

## 2021-09-29 ENCOUNTER — Encounter: Payer: Self-pay | Admitting: Neurology

## 2021-09-29 VITALS — BP 145/72 | HR 79 | Ht 61.0 in | Wt 106.0 lb

## 2021-09-29 DIAGNOSIS — M47812 Spondylosis without myelopathy or radiculopathy, cervical region: Secondary | ICD-10-CM | POA: Diagnosis not present

## 2021-09-29 DIAGNOSIS — R569 Unspecified convulsions: Secondary | ICD-10-CM

## 2021-09-29 NOTE — Patient Instructions (Signed)
Continue current seizure regimen  Check lamictal level  Call for any seizures See you back in 6 months

## 2021-10-01 LAB — LAMOTRIGINE LEVEL: Lamotrigine Lvl: 11.6 ug/mL (ref 2.0–20.0)

## 2021-10-05 ENCOUNTER — Telehealth: Payer: Self-pay | Admitting: *Deleted

## 2021-10-05 NOTE — Telephone Encounter (Signed)
Pt verified by name and DOB,  normal results given per provider, pt voiced understanding all question answered. °

## 2021-10-05 NOTE — Telephone Encounter (Signed)
-----   Message from Glean Salvo, NP sent at 10/05/2021  8:32 AM EDT ----- Please call, Lamictal level is therapeutic at 11.6.  Can continue current dosing.  I would like for her to monitor for any signs of cervical radiculopathy, including neck pain, numbness or weakness to the arms or legs, changes to gait.  The MRI that was ordered back in June shows the potential for right C6 nerve root compression, and C7 to either side.  In the event that this occurs, would consider referral to neurosurgery for opinion.

## 2021-10-05 NOTE — Telephone Encounter (Signed)
Pt would like for nurse to call her back. Pt said she has a question.

## 2021-10-13 DIAGNOSIS — Z299 Encounter for prophylactic measures, unspecified: Secondary | ICD-10-CM | POA: Diagnosis not present

## 2021-10-13 DIAGNOSIS — R5383 Other fatigue: Secondary | ICD-10-CM | POA: Diagnosis not present

## 2021-10-13 DIAGNOSIS — Z7189 Other specified counseling: Secondary | ICD-10-CM | POA: Diagnosis not present

## 2021-10-13 DIAGNOSIS — I10 Essential (primary) hypertension: Secondary | ICD-10-CM | POA: Diagnosis not present

## 2021-10-13 DIAGNOSIS — E7800 Pure hypercholesterolemia, unspecified: Secondary | ICD-10-CM | POA: Diagnosis not present

## 2021-10-13 DIAGNOSIS — Z0000 Encounter for general adult medical examination without abnormal findings: Secondary | ICD-10-CM | POA: Diagnosis not present

## 2021-10-13 DIAGNOSIS — R002 Palpitations: Secondary | ICD-10-CM | POA: Diagnosis not present

## 2021-10-13 DIAGNOSIS — E559 Vitamin D deficiency, unspecified: Secondary | ICD-10-CM | POA: Diagnosis not present

## 2021-10-13 DIAGNOSIS — Z6820 Body mass index (BMI) 20.0-20.9, adult: Secondary | ICD-10-CM | POA: Diagnosis not present

## 2021-10-13 DIAGNOSIS — Z1211 Encounter for screening for malignant neoplasm of colon: Secondary | ICD-10-CM | POA: Diagnosis not present

## 2021-10-13 DIAGNOSIS — Z1331 Encounter for screening for depression: Secondary | ICD-10-CM | POA: Diagnosis not present

## 2021-10-13 DIAGNOSIS — Z79899 Other long term (current) drug therapy: Secondary | ICD-10-CM | POA: Diagnosis not present

## 2021-11-01 ENCOUNTER — Encounter (INDEPENDENT_AMBULATORY_CARE_PROVIDER_SITE_OTHER): Payer: Self-pay | Admitting: *Deleted

## 2021-12-04 DIAGNOSIS — R569 Unspecified convulsions: Secondary | ICD-10-CM | POA: Diagnosis not present

## 2021-12-04 DIAGNOSIS — E7800 Pure hypercholesterolemia, unspecified: Secondary | ICD-10-CM | POA: Diagnosis not present

## 2021-12-04 DIAGNOSIS — I10 Essential (primary) hypertension: Secondary | ICD-10-CM | POA: Diagnosis not present

## 2021-12-04 DIAGNOSIS — F329 Major depressive disorder, single episode, unspecified: Secondary | ICD-10-CM | POA: Diagnosis not present

## 2021-12-14 ENCOUNTER — Other Ambulatory Visit: Payer: Self-pay | Admitting: Neurology

## 2021-12-16 ENCOUNTER — Other Ambulatory Visit: Payer: Self-pay | Admitting: *Deleted

## 2021-12-16 DIAGNOSIS — Z23 Encounter for immunization: Secondary | ICD-10-CM | POA: Diagnosis not present

## 2021-12-16 DIAGNOSIS — Z299 Encounter for prophylactic measures, unspecified: Secondary | ICD-10-CM | POA: Diagnosis not present

## 2021-12-16 DIAGNOSIS — Z0000 Encounter for general adult medical examination without abnormal findings: Secondary | ICD-10-CM | POA: Diagnosis not present

## 2021-12-16 DIAGNOSIS — Z6820 Body mass index (BMI) 20.0-20.9, adult: Secondary | ICD-10-CM | POA: Diagnosis not present

## 2021-12-16 DIAGNOSIS — I10 Essential (primary) hypertension: Secondary | ICD-10-CM | POA: Diagnosis not present

## 2021-12-16 DIAGNOSIS — F0280 Dementia in other diseases classified elsewhere without behavioral disturbance: Secondary | ICD-10-CM | POA: Diagnosis not present

## 2021-12-16 DIAGNOSIS — G309 Alzheimer's disease, unspecified: Secondary | ICD-10-CM | POA: Diagnosis not present

## 2021-12-16 DIAGNOSIS — E7800 Pure hypercholesterolemia, unspecified: Secondary | ICD-10-CM | POA: Diagnosis not present

## 2021-12-16 MED ORDER — MEMANTINE HCL 10 MG PO TABS: 10.0000 mg | ORAL_TABLET | Freq: Two times a day (BID) | ORAL | 3 refills | Status: DC

## 2022-01-12 ENCOUNTER — Other Ambulatory Visit: Payer: Self-pay | Admitting: Internal Medicine

## 2022-01-12 DIAGNOSIS — Z1231 Encounter for screening mammogram for malignant neoplasm of breast: Secondary | ICD-10-CM

## 2022-01-17 ENCOUNTER — Ambulatory Visit (INDEPENDENT_AMBULATORY_CARE_PROVIDER_SITE_OTHER): Payer: PPO | Admitting: Gastroenterology

## 2022-01-18 NOTE — Progress Notes (Unsigned)
Patient: Sheri Valencia Date of Birth: 1945-06-18  Reason for Visit: Follow up History from: Patient Primary Neurologist: Dr. Terrace Arabia   ASSESSMENT AND PLAN 76 y.o. year old female   1.  Complex partial seizure with secondary generalization -No recent seizure since Jul 18, 2021 (on Keppra 500 mg twice a day, Lamictal XR 250 mg daily) -Continue Lamictal XR 50 mg a.m., 250 mg p.m., Onfi 2.5 mg at bedtime (taper off Keppra June 2023), will need to check with pharmacy to see if refills are needed -Lamictal level was 11.6 in July 2023 -EEG was normal -No driving until seizure-free 6 months is  driving law, we has discussed continuing to refrain from driving until we determine her seizure stability long term   2.  History of cervical spondylosis -Denies any worsening neck pain, gait or balance -MRI cervical spine 08/30/21 degenerative changes throughout, most at C5-C6 with severe right foraminal narrowing, potential for right C6 nerve root compression; also C6-C7 severe right greater than left foraminal narrowing, potential for C7 nerve root compression -Monitor symptoms, signs of worsening  3.  Mild cognitive impairment -MMSE 29/30 today, continue Namenda 10 mg twice a day  HISTORY  Sheri Valencia is a 76 years old right-handed Caucasian female,followed  for complex partial seizure with secondary generalization. She has a history of complex partial seizure with secondary generalization since age 76,  presented with rising sensation, then passed out followed by generalized motor seizure, last seizure was in 2002, she was treated with Lamictal 200 mg twice a day, continue to have recurrent seizure, Keppra 500 mg 2 tablets twice a day was added on, no recurrent seizure since. 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 Other levels alsodemonstrate significant stenosis.. She denies significant neck pain She has mild unsteady gait, always contributed to her left hip problem,  She was evaluated recently  by Dr. Channing Mutters, neurosurgeon, she follows up yearly   She returns for reevaluation and refills. She is on Aricept by PCP Dr. [name].    UPDATE Jul 05 2017:  I was able to review the letter from her  Sister [name], she is accompanied by her friend [name] at today's clinical visit,   "From her sister's letter: Sheri Valencia continue has memory problem, easily gets confused, gets frustrated easily, but deny her problem, there was also description of paranoid, emotional incontinence, forgetful, poor decision-making process for her health, irrational spell, recent purchase of a new car, but difficult to operating her new vehicle, impulsive action, got lost while driving, difficulty working with her computer, loose piece constantly, difficulty managing her bills, ongoing since 2017, progressively getting worse"   [name] drove her here today, she has lived [place] for 50 years, 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   She likes to walk, reading.  She lives in a codominum.    She has not have seizure for few years. 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   UPDATE October 26 2017: She was able to taper off Keppra 500 mg twice daily, only on lamotrigine 200 mg twice a day, doing better, along with Namenda 10 mg twice a day, her memory seems to improve will stabilize, care of her elderly aunt,   We personally reviewed MRI of the brain in May 2019, mild generalized atrophy no acute abnormality,   EEG was normal    Laboratory evaluation in May 2019 showed normal TSH, B12, RPR, CBC, CMP showed mild elevated 1.25, with GFR of 44, Keppra level was 66, lamotrigine level was 22, she has no signs of toxicity, the level was not trough level   Virtual Visit via Video on Jul 11, 2018   She is with her sister [name] at visit.  She had no recurrent seizure, taking lamotrigine 200 mg twice a day, Keppra 500 mg twice a day, he complains of increased confusion, sometimes unsteady gait, fell few times, sister also concerned about worsening memory loss, had few incident of mis- handling her debit card,   Previously we have tried to taper off Keppra 500 mg 2 tablets twice a day, because her underweight, has not had recurrent seizure for many years, but she is concerned about potential recurrent seizure, put herself back on Keppra 1 tablets twice a day,   Laboratory evaluations on Jul 05, 2017, normal TSH 1.9, lamotrigine 22.7, Keppra 66.5, normal B12, CBC hemoglobin of 11.1, creatinine of 1.25, potassium of 5.4   Update October 31, 2018: She is with her sister [name] at today's visit, she continue to take lamotrigine 200 mg twice a day, has no recurrent seizure, she has worsening memory loss, excessive daytime sleepiness, fatigue, continue have depression, taking Zoloft 100 mg  daily, she complains of change of taste, has decreased appetite, with some weight loss,   Laboratory evaluations on Jul 12, 2018 showed lamotrigine level was 8,   UPDATE Apr 05 2019: She is accompanied by her Sister [name] at today's clinical visit, before Christmas, she complains of few days history of poor appetite, frequent diarrhea, on March 03, 2019, she woke up on the floor, has difficulty getting up from the floor, had a transient loss of consciousness," but felt like it is not a seizure", she called ambulance, was brought to the emergency room at [facility], lamotrigine level was 30, I personally reviewed CT head without contrast showed no acute findings UA showed evidence of UTI, her symptoms improved with hydration,   However, she remained confused, weak, difficulty walking was admitted to [facility] on March 08, 2019, personally reviewed record, CT head showed no acute abnormality, CT of cervical spine showed multilevel degenerative changes, most severe at C5-6, C6 and 7,   She was treated for UTI, planning on to take Macrobid 100 mg daily for 1 month, there was no recurrent seizure activity, she is back on her home regimen of lamotrigine ER 200 mg 2 tablets every night   She does complains of low back pain, continue to feel generalized weakness, gait abnormality, "I have to think about how to walk my  leg", also worsening urinary urgency, incontinence   Laboratory evaluation showed mild anemia hemoglobin of 10.8 CMP showed mildly low potassium 3.4, decreased albumin 3.4   She now lives with her sister temporarily, planning on for short-term assisted living placement due to her continued gait abnormality, worsening confusion,   UPDATE May 14 2019: She is with her sister at today's clinical visit, she is currently lives at at [facility], she had a significant improvement, no gait abnormality, no recurrent seizure, is planning on going home soon.   Acute onset of confusion in  December 2020, is most likely related to her UTI, dehydration, polypharmacy treatment, depression anxiety, Lexapro 10 mg daily has been helpful.   We personally reviewed MRI of cervical spine on April 29, 2019: Multilevel degenerative changes, most severe at C5-6, with rightward disc protrusion, borderline mild spinal stenosis, no cord signal changes, severe right foraminal stenosis,   UPDATE Sept 13 2022: She had a breakthrough seizure on November 30, 2019, and May 02, 2020, both seizure happened while her sister was driving, she suddenly become required, forceful head turning to the right, neck hyperextension, body tonic-clonic movement,  She is now back on lamotrigine XR 250 mg every night, Keppra 500 mg twice a day was added on, she tolerated it well, there was no recurrent seizure   UPDATE August 19 2021: [facility] for vacation, she had a seizure on Jul 18, 2021, she finished taking a shower, stepping out, and loss of consciousness  Her sister heard a thump, but could not open the door, she was able to pick through the opening, patient had body jerking movement,  Paramedic was called, was treated at local hospital,   I reviewed her hospital record, blood pressure 173/77, temperature 98.1 pulse 100, laboratory evaluation showed mild elevated WBC 10.4, hemoglobin 11.4, creatinine 1, glucose 134 otherwise normal CMP, urine showed WBC 26-50, moderate epithelial cells,  CT head showed no acute intracranial pathology, CT angiogram showed 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, otherwise patent neck vasculature, no intracranial large vessel occlusion  CT of cervical region showed no fracture,, multilevel level of degenerative disease, grade 1 anterolisthesis of C4 on 5,  EKG showed normal sinus rhythm  She was treated with azithromycin, ceftriaxone for UTI   Last antiepileptic level was in September 2022 with current dose, Keppra level was 39.8,  lamotrigine level was 13.7 She complains of worsening depression anxiety, crying spells since seizure, was given Wellbutrin, she worried about the side effect of lowered seizure threshold, is not taking it anymore,  In addition she complains of worsening gait abnormality, urinary frequency urgency since recurrent seizure and fall in May 2023  Update 09/29/21 SS: Here today with her sister, no recurrent seizure, on Lamictal XR 50 mg AM/250 mg PM, Onfi 2.5 mg at bedtime.  Does have fatigue, takes a nap, not convinced medication related.  Sister thinks she is doing great.  Denies significant neck pain, has urinary urgency, no falls, or gait changes.  EEG was normal 08/24/21, MRI of cervical spine showed multilevel degenerative changes, at C5-6 severe right foraminal narrowing and moderate left, potential for right C6 nerve root compression, also C7 nerve root compression potential.  IMPRESSION: This MRI of the cervical spine without contrast shows the following: 1.  At C3-C4, there is mild spinal stenosis and moderate bilateral foraminal narrowing but no nerve root compression. 2.  At C4-C5, there is minimal anterolisthesis and other degenerative changes causing moderate left foraminal narrowing  but no spinal stenosis or nerve root compression. 3.  At C5-C6, there is a right paramedian disc protrusion and other degenerative change causing mild spinal stenosis and severe right foraminal narrowing and moderate left foraminal narrowing.  There is potential for right C6 nerve root compression. 4.  At C6-C7, there are degenerative changes causing mild spinal stenosis and moderately severe right greater than left foraminal narrowing.  There is potential for C7 nerve root compression to either side. 5.  Degenerative changes are stable compared to the 04/29/2019 MRI.  Update January 19, 2022 SS: No seizures, living alone, her sister drove her, remains on Onfi 2.5 mg at bedtime, Lamictal XR 50 mg in AM, 250 mg PM.  Denies any problems or concerns. Saw PCP earlier today with recurrent UTI, started antibiotic. 09/29/21 Lamictal level was 11.6.    REVIEW OF SYSTEMS: Out of a complete 14 system review of symptoms, the patient complains only of the following symptoms, and all other reviewed systems are negative.  See HPI  ALLERGIES: Allergies  Allergen Reactions   Ativan [Lorazepam]     confusion    HOME MEDICATIONS: Outpatient Medications Prior to Visit  Medication Sig Dispense Refill   alendronate (FOSAMAX) 70 MG tablet Take 70 mg by mouth once a week.     aspirin EC 81 MG tablet Take 81 mg by mouth daily.     cloBAZam (ONFI) 2.5 MG/ML solution Take 1 mL (2.5 mg total) by mouth at bedtime. 120 mL 3   LamoTRIgine (LAMICTAL XR) 25 MG TB24 24 hour tablet Take 2 tablets (50 mg total) by mouth daily. 60 tablet 11   LamoTRIgine 250 MG TB24 24 hour tablet Take 250 mg by mouth at bedtime. 90 tablet 4   memantine (NAMENDA) 10 MG tablet Take 1 tablet (10 mg total) by mouth 2 (two) times daily. 180 tablet 3   rosuvastatin (CRESTOR) 5 MG tablet Take 5 mg by mouth daily.     sertraline (ZOLOFT) 100 MG tablet Take 100 mg by mouth daily.     No facility-administered medications prior to visit.    PAST MEDICAL HISTORY: Past Medical History:  Diagnosis Date   Memory loss    Seizure (HCC)     PAST SURGICAL HISTORY: Past Surgical History:  Procedure Laterality Date   APPENDECTOMY     TUBAL LIGATION      FAMILY HISTORY: Family History  Problem Relation Age of Onset   Pneumonia Mother    Heart attack Maternal Grandmother    Stroke Maternal Grandmother    Suicidality Brother    Aneurysm Maternal Aunt    Breast cancer Sister        04/2015  Stage I    SOCIAL HISTORY: Social History   Socioeconomic History   Marital status: Widowed    Spouse name: Not on file   Number of children: 0   Years of education: college   Highest education level: Not on file  Occupational History    Employer: RETIRED     Comment: retired  Tobacco Use   Smoking status: Never   Smokeless tobacco: Never  Substance and Sexual Activity   Alcohol use: Yes    Alcohol/week: 1.0 standard drink of alcohol    Types: 1 Glasses of wine per week   Drug use: No   Sexual activity: Not on file  Other Topics Concern   Not on file  Social History Narrative   Patient lives at home alone and she is widowed. Retired.  College education    Caffeine one cup daily.         Social Determinants of Health   Financial Resource Strain: Not on file  Food Insecurity: Not on file  Transportation Needs: Not on file  Physical Activity: Not on file  Stress: Not on file  Social Connections: Not on file  Intimate Partner Violence: Not on file   PHYSICAL EXAM  Vitals:   01/19/22 1035  BP: 136/62  Pulse: 89  Weight: 101 lb (45.8 kg)  Height: 5\' 1"  (1.549 m)    Body mass index is 19.08 kg/m.    01/19/2022   10:00 AM 05/07/2020    9:21 AM 12/04/2019    8:17 AM  MMSE - Mini Mental State Exam  Orientation to time 5 5 5   Orientation to Place 5 5 5   Registration 3 3 3   Attention/ Calculation 5 5 5   Recall 2 2 3   Language- name 2 objects 2 2 2   Language- repeat 1 1 1   Language- follow 3 step command 3 3 3   Language- read & follow direction 1 1 1   Write a sentence 1 1 1   Copy design 1 1 0  Total score 29 29 29     Generalized: Well developed, in no acute distress  Neurological examination  Mentation: Alert oriented to time, place, history taking. Follows all commands speech and language fluent Cranial nerve II-XII: Pupils were equal round reactive to light. Extraocular movements were full, visual field were full on confrontational test. Facial sensation and strength were normal.  Head turning and shoulder shrug  were normal and symmetric. Motor: The motor testing reveals 5 over 5 strength of all 4 extremities. Good symmetric motor tone is noted throughout.  Sensory: Sensory testing is intact to soft touch on all 4  extremities. No evidence of extinction is noted.  Coordination: Cerebellar testing reveals good finger-nose-finger and heel-to-shin bilaterally.  Gait and station: Gait is normal.  Slightly wide-based. Reflexes: Deep tendon reflexes are symmetric increased at the knees  DIAGNOSTIC DATA (LABS, IMAGING, TESTING) - I reviewed patient records, labs, notes, testing and imaging myself where available.  Lab Results  Component Value Date   WBC 9.1 12/04/2019   HGB 11.4 12/04/2019   HCT 34.5 12/04/2019   MCV 87 12/04/2019   PLT 307 12/04/2019      Component Value Date/Time   NA 137 12/04/2019 0849   K 5.3 (H) 12/04/2019 0849   CL 101 12/04/2019 0849   CO2 24 12/04/2019 0849   GLUCOSE 94 12/04/2019 0849   GLUCOSE 122 (H) 03/03/2019 0916   BUN 12 12/04/2019 0849   CREATININE 1.05 (H) 12/04/2019 0849   CALCIUM 9.6 12/04/2019 0849   PROT 7.1 12/04/2019 0849   ALBUMIN 4.3 12/04/2019 0849   AST 33 12/04/2019 0849   ALT 23 12/04/2019 0849   ALKPHOS 123 (H) 12/04/2019 0849   BILITOT 0.2 12/04/2019 0849   GFRNONAA 53 (L) 12/04/2019 0849   GFRAA 61 12/04/2019 0849   No results found for: "CHOL", "HDL", "LDLCALC", "LDLDIRECT", "TRIG", "CHOLHDL" No results found for: "HGBA1C" Lab Results  Component Value Date   VITAMINB12 865 07/05/2017   Lab Results  Component Value Date   TSH 3.360 11/17/2020    Margie Ege, AGNP-C, DNP 01/19/2022, 10:54 AM [facility] [address] [place], [place] [address] [phone]

## 2022-01-19 ENCOUNTER — Ambulatory Visit: Payer: PPO | Admitting: Neurology

## 2022-01-19 ENCOUNTER — Encounter: Payer: Self-pay | Admitting: Neurology

## 2022-01-19 VITALS — BP 136/62 | HR 89 | Ht 61.0 in | Wt 101.0 lb

## 2022-01-19 DIAGNOSIS — Z299 Encounter for prophylactic measures, unspecified: Secondary | ICD-10-CM | POA: Diagnosis not present

## 2022-01-19 DIAGNOSIS — R569 Unspecified convulsions: Secondary | ICD-10-CM

## 2022-01-19 DIAGNOSIS — G3184 Mild cognitive impairment, so stated: Secondary | ICD-10-CM

## 2022-01-19 DIAGNOSIS — E440 Moderate protein-calorie malnutrition: Secondary | ICD-10-CM | POA: Diagnosis not present

## 2022-01-19 DIAGNOSIS — Z681 Body mass index (BMI) 19 or less, adult: Secondary | ICD-10-CM | POA: Diagnosis not present

## 2022-01-19 DIAGNOSIS — I10 Essential (primary) hypertension: Secondary | ICD-10-CM | POA: Diagnosis not present

## 2022-01-19 DIAGNOSIS — M47812 Spondylosis without myelopathy or radiculopathy, cervical region: Secondary | ICD-10-CM

## 2022-01-19 DIAGNOSIS — R350 Frequency of micturition: Secondary | ICD-10-CM | POA: Diagnosis not present

## 2022-01-19 DIAGNOSIS — E7800 Pure hypercholesterolemia, unspecified: Secondary | ICD-10-CM | POA: Diagnosis not present

## 2022-01-19 MED ORDER — LAMOTRIGINE ER 250 MG PO TB24: 250.0000 mg | ORAL_TABLET | Freq: Every day | ORAL | 4 refills | Status: DC

## 2022-01-19 MED ORDER — LAMOTRIGINE ER 25 MG PO TB24: 50.0000 mg | ORAL_TABLET | Freq: Every day | ORAL | 11 refills | Status: DC

## 2022-01-19 NOTE — Patient Instructions (Signed)
[place] driving law is no driving until seizure free 6 months Continue current medications Call for any seizures  See you back in 6 months

## 2022-01-24 MED ORDER — CLOBAZAM 2.5 MG/ML PO SUSP: 2.5000 mg | Freq: Every day | ORAL | 5 refills | Status: DC

## 2022-01-24 NOTE — Addendum Note (Signed)
Addended by: Glean Salvo on: 01/24/2022 12:51 PM   Modules accepted: Orders

## 2022-02-02 DIAGNOSIS — Z299 Encounter for prophylactic measures, unspecified: Secondary | ICD-10-CM | POA: Diagnosis not present

## 2022-02-02 DIAGNOSIS — N3941 Urge incontinence: Secondary | ICD-10-CM | POA: Diagnosis not present

## 2022-02-02 DIAGNOSIS — I10 Essential (primary) hypertension: Secondary | ICD-10-CM | POA: Diagnosis not present

## 2022-02-02 DIAGNOSIS — N390 Urinary tract infection, site not specified: Secondary | ICD-10-CM | POA: Diagnosis not present

## 2022-02-03 DIAGNOSIS — I10 Essential (primary) hypertension: Secondary | ICD-10-CM | POA: Diagnosis not present

## 2022-02-03 DIAGNOSIS — F329 Major depressive disorder, single episode, unspecified: Secondary | ICD-10-CM | POA: Diagnosis not present

## 2022-02-03 DIAGNOSIS — E7800 Pure hypercholesterolemia, unspecified: Secondary | ICD-10-CM | POA: Diagnosis not present

## 2022-02-03 DIAGNOSIS — R569 Unspecified convulsions: Secondary | ICD-10-CM | POA: Diagnosis not present

## 2022-02-14 ENCOUNTER — Ambulatory Visit
Admission: RE | Admit: 2022-02-14 | Discharge: 2022-02-14 | Disposition: A | Discharge status: Home / Self Care | Payer: PPO | Source: Ambulatory Visit | Attending: Internal Medicine | Admitting: Internal Medicine

## 2022-02-14 DIAGNOSIS — Z1231 Encounter for screening mammogram for malignant neoplasm of breast: Secondary | ICD-10-CM

## 2022-03-24 ENCOUNTER — Ambulatory Visit (INDEPENDENT_AMBULATORY_CARE_PROVIDER_SITE_OTHER): Payer: PPO | Admitting: Gastroenterology

## 2022-03-24 ENCOUNTER — Encounter (INDEPENDENT_AMBULATORY_CARE_PROVIDER_SITE_OTHER): Payer: Self-pay | Admitting: Gastroenterology

## 2022-03-24 VITALS — BP 131/70 | HR 89 | Temp 98.4°F | Ht 61.0 in | Wt 98.6 lb

## 2022-03-24 DIAGNOSIS — R195 Other fecal abnormalities: Secondary | ICD-10-CM | POA: Diagnosis not present

## 2022-03-24 DIAGNOSIS — K5900 Constipation, unspecified: Secondary | ICD-10-CM | POA: Diagnosis not present

## 2022-03-24 NOTE — Progress Notes (Addendum)
Referring Provider: Kirstie Peri, MD Primary Care Physician:  Kirstie Peri, MD Primary GI Physician: new   Chief Complaint  Patient presents with   Colonoscopy    Heme positive stool.    HPI:   Sheri Valencia is a 77 y.o. female with past medical history of memory loss, seizures htn, high cholesterol, vitamin d deficiency, depression.   Patient presenting today for heme positive stools   Last labs with hgb 11.5 and heme positive stool in August 2023  TSH in 2022 3.36  Patient reports that she has not had a colonoscopy in many years. No rectal bleeding or melena. She notes that more recently she may go a few days without a BM and then will go a few days where she may have multiple stools that sometimes hard. Denies diarrhea. No abdominal pain. No weight loss, changes in appetite, nausea or vomiting. Feels that water intake is good. Does get a decent amount of fruits and veggies in her diet as well. She does not take anything for constipation.   History of seizures, last was maybe 1-1.5 years ago.   NSAID use: ibuprofen on occasion  Social hx: glass of wine maybe once per week. No tobacco  Fam hx:no crc or liver disease   Last Colonoscopy:>10 years ago, unsure of results but patient thinks normal  Last Endoscopy: never   Recommendations:    Past Medical History:  Diagnosis Date   Memory loss    Seizure (HCC)     Past Surgical History:  Procedure Laterality Date   APPENDECTOMY     TUBAL LIGATION      Current Outpatient Medications  Medication Sig Dispense Refill   alendronate (FOSAMAX) 70 MG tablet Take 70 mg by mouth once a week.     aspirin EC 81 MG tablet Take 81 mg by mouth daily.     buPROPion ER (WELLBUTRIN SR) 100 MG 12 hr tablet Take 100 mg by mouth every morning.     LamoTRIgine (LAMICTAL XR) 25 MG TB24 24 hour tablet Take 2 tablets (50 mg total) by mouth daily. 60 tablet 11   LamoTRIgine 250 MG TB24 24 hour tablet Take 250 mg by mouth at bedtime. 90 tablet 4    memantine (NAMENDA) 10 MG tablet Take 1 tablet (10 mg total) by mouth 2 (two) times daily. 180 tablet 3   rosuvastatin (CRESTOR) 5 MG tablet Take 10 mg by mouth daily.     sertraline (ZOLOFT) 100 MG tablet Take 100 mg by mouth daily.     No current facility-administered medications for this visit.    Allergies as of 03/24/2022 - Review Complete 03/24/2022  Allergen Reaction Noted   Ativan [lorazepam]  06/26/2014    Family History  Problem Relation Age of Onset   Pneumonia Mother    Heart attack Maternal Grandmother    Stroke Maternal Grandmother    Suicidality Brother    Aneurysm Maternal Aunt    Breast cancer Sister        04/2015  Stage I    Social History   Socioeconomic History   Marital status: Widowed    Spouse name: Not on file   Number of children: 0   Years of education: college   Highest education level: Not on file  Occupational History    Employer: RETIRED    Comment: retired  Tobacco Use   Smoking status: Never   Smokeless tobacco: Never  Substance and Sexual Activity   Alcohol use: Yes  Alcohol/week: 1.0 standard drink of alcohol    Types: 1 Glasses of wine per week   Drug use: No   Sexual activity: Not Currently  Other Topics Concern   Not on file  Social History Narrative   Patient lives at home alone and she is widowed. Retired.   College education    Caffeine one cup daily.         Social Determinants of Health   Financial Resource Strain: Not on file  Food Insecurity: Not on file  Transportation Needs: Not on file  Physical Activity: Not on file  Stress: Not on file  Social Connections: Not on file    Review of systems General: negative for malaise, night sweats, fever, chills, weight loss Neck: Negative for lumps, goiter, pain and significant neck swelling Resp: Negative for cough, wheezing, dyspnea at rest CV: Negative for chest pain, leg swelling, palpitations, orthopnea GI: denies melena, hematochezia, nausea, vomiting,  diarrhea, dysphagia, odyonophagia, early satiety or unintentional weight loss. +constipation  MSK: Negative for joint pain or swelling, back pain, and muscle pain. Derm: Negative for itching or rash Psych: Denies depression, anxiety, memory loss, confusion. No homicidal or suicidal ideation.  Heme: Negative for prolonged bleeding, bruising easily, and swollen nodes. Endocrine: Negative for cold or heat intolerance, polyuria, polydipsia and goiter. Neuro: negative for tremor, gait imbalance, syncope and seizures. The remainder of the review of systems is noncontributory.  Physical Exam: BP 131/70 (BP Location: Right Arm, Patient Position: Sitting, Cuff Size: Normal)   Pulse 89   Temp 98.4 F (36.9 C) (Oral)   Ht 5\' 1"  (1.549 m)   Wt 98 lb 9.6 oz (44.7 kg)   SpO2 95%   BMI 18.63 kg/m  General:   Alert and oriented. No distress noted. Pleasant and cooperative.  Head:  Normocephalic and atraumatic. Eyes:  Conjuctiva clear without scleral icterus. Mouth:  Oral mucosa pink and moist. Good dentition. No lesions. Heart: Normal rate and rhythm, s1 and s2 heart sounds present.  Lungs: Clear lung sounds in all lobes. Respirations equal and unlabored. Abdomen:  +BS, soft, non-tender and non-distended. No rebound or guarding. No HSM or masses noted. Derm: No palmar erythema or jaundice Msk:  Symmetrical without gross deformities. Normal posture. Extremities:  Without edema. Neurologic:  Alert and  oriented x4 Psych:  Alert and cooperative. Normal mood and affect.  Invalid input(s): "6 MONTHS"   ASSESSMENT: Sheri Valencia is a 77 y.o. female presenting today as a new patient for heme positive stool  Heme positive stool: no rectal bleeding or melena. Has some constipation, no weight loss, changes in appetite, nausea, vomiting, early satiety, dysphagia. Last TCS >10 years ago. She is having some constipation which I recommended she start benefiber for and continue with good water intake. Also  Recommend proceeding with Colonoscopy for further evaluation as we cannot rule out bleeding polyps, hemorrhoids, AVMs, or malignancy. Indications, risks and benefits of procedure discussed in detail with patient. Patient verbalized understanding and is in agreement to proceed with Colonoscopy.    PLAN:  Schedule colonoscopy ASA II ENDO 1 2. Continue with good water intake  3. Start benefiber 1T BID with meals   All questions were answered, patient verbalized understanding and is in agreement with plan as outlined above.   Follow Up: 3 months   Shanita Kanan L. Alver Sorrow, MSN, APRN, AGNP-C Adult-Gerontology Nurse Practitioner [facility]  I have reviewed the note and agree with the APP's assessment as described in this  progress note  Maylon Peppers, MD Gastroenterology and Hepatology [facility]

## 2022-03-24 NOTE — Patient Instructions (Signed)
It was nice to meet you! We will get you scheduled for colonoscopy for further evaluation of blood found in your stools Continue good water intake, aim for atleast 64 oz per day Increase fruits, veggies and whole grains, kiwi and prunes are especially good for constipation You can also start benefiber 1T twice daily with a meal, need to ensure you are drinking plenty of water with this  Follow up 3 months

## 2022-03-25 ENCOUNTER — Telehealth: Payer: Self-pay | Admitting: *Deleted

## 2022-03-25 ENCOUNTER — Telehealth: Payer: Self-pay | Admitting: Neurology

## 2022-03-25 NOTE — Telephone Encounter (Signed)
Pt is asking for a call to discuss the 25 mg of LamoTRIgine (LAMICTAL XR) 25 MG TB24 24 hour tablet  that she takes in the morning and the 250mg  she takes at bedtime

## 2022-03-25 NOTE — Telephone Encounter (Signed)
Called pt, no answer and not able to leave VM to schedule TCS with Dr. Jenetta Downer, ASA 2

## 2022-03-27 DIAGNOSIS — R194 Change in bowel habit: Secondary | ICD-10-CM | POA: Insufficient documentation

## 2022-03-27 DIAGNOSIS — K5900 Constipation, unspecified: Secondary | ICD-10-CM | POA: Insufficient documentation

## 2022-03-28 NOTE — Telephone Encounter (Signed)
I called the pt back and we discussed message. She wanted to confirm the dosage of her medication Lamictal. Reviewed last office note which states  -Continue Lamictal XR 50 mg a.m., 250 mg p.m.,   Pt verbalized understanding and agreement to the plan, nothing further needed at this time.

## 2022-03-29 DIAGNOSIS — Z0000 Encounter for general adult medical examination without abnormal findings: Secondary | ICD-10-CM | POA: Diagnosis not present

## 2022-03-29 DIAGNOSIS — E7800 Pure hypercholesterolemia, unspecified: Secondary | ICD-10-CM | POA: Diagnosis not present

## 2022-03-29 DIAGNOSIS — Z681 Body mass index (BMI) 19 or less, adult: Secondary | ICD-10-CM | POA: Diagnosis not present

## 2022-03-29 DIAGNOSIS — Z1339 Encounter for screening examination for other mental health and behavioral disorders: Secondary | ICD-10-CM | POA: Diagnosis not present

## 2022-03-29 DIAGNOSIS — Z1331 Encounter for screening for depression: Secondary | ICD-10-CM | POA: Diagnosis not present

## 2022-03-29 DIAGNOSIS — F339 Major depressive disorder, recurrent, unspecified: Secondary | ICD-10-CM | POA: Diagnosis not present

## 2022-03-29 DIAGNOSIS — I10 Essential (primary) hypertension: Secondary | ICD-10-CM | POA: Diagnosis not present

## 2022-03-29 DIAGNOSIS — Z7189 Other specified counseling: Secondary | ICD-10-CM | POA: Diagnosis not present

## 2022-03-29 DIAGNOSIS — Z299 Encounter for prophylactic measures, unspecified: Secondary | ICD-10-CM | POA: Diagnosis not present

## 2022-03-31 NOTE — Telephone Encounter (Signed)
Called pt. Not able to leave VM. I have mailed letter to pt to call to schedule.

## 2022-04-19 ENCOUNTER — Ambulatory Visit: Payer: PPO | Admitting: Neurology

## 2022-04-19 ENCOUNTER — Encounter: Payer: Self-pay | Admitting: Neurology

## 2022-04-19 VITALS — BP 140/80 | HR 80 | Ht 61.0 in | Wt 105.0 lb

## 2022-04-19 DIAGNOSIS — M47812 Spondylosis without myelopathy or radiculopathy, cervical region: Secondary | ICD-10-CM

## 2022-04-19 DIAGNOSIS — R569 Unspecified convulsions: Secondary | ICD-10-CM | POA: Diagnosis not present

## 2022-04-19 MED ORDER — CLOBAZAM 2.5 MG/ML PO SUSP: 2.5000 mg | Freq: Every day | ORAL | 0 refills | Status: DC

## 2022-04-19 NOTE — Progress Notes (Signed)
Patient: Sheri Valencia Date of Birth: 09/18/1945  Reason for Visit: Follow up History from: Patient Primary Neurologist: Dr. Terrace Valencia   ASSESSMENT AND PLAN 77 y.o. year old female   1.  Complex partial seizure with secondary generalization -No recent seizure since Jul 18, 2021 (on Keppra 500 mg twice a day, Lamictal XR 250 mg daily) -Continue Lamictal XR 50 mg a.m., 250 mg p.m., Onfi 2.5 mg at bedtime (taper off Keppra June 2023),  -Lamictal level was 11.6 in July 2023 -EEG was normal -I called the pharmacy, she is still taking Onfi, no refills left, I will send in -Check labs at next visit -Per PCP started her on Wellbutrin, I would not recommend given her seizure history, she reports side effects is going to stop, needs to discuss with PCP, on low dose, needs to clarify if taper is needed, call today  2.  History of cervical spondylosis -Denies any worsening neck pain, gait or balance -MRI cervical spine 08/30/21 degenerative changes throughout, most at C5-C6 with severe right foraminal narrowing, potential for right C6 nerve root compression; also C6-C7 severe right greater than left foraminal narrowing, potential for C7 nerve root compression -Monitor symptoms, signs of worsening  3.  Mild cognitive impairment -Recheck at next visit, last MMSE 29/30, continue Namenda 10 mg twice a day  Meds ordered this encounter  Medications   cloBAZam (ONFI) 2.5 MG/ML solution    Sig: Take 1 mL (2.5 mg total) by mouth at bedtime.    Dispense:  120 mL    Refill:  0    Refill on file   HISTORY  Sheri Valencia is a 77 years old right-handed Caucasian female,followed  for complex partial seizure with secondary generalization. She has a history of complex partial seizure with secondary generalization since age 76,  presented with rising sensation, then passed out followed by generalized motor seizure, last seizure was in 2002, she was treated with Lamictal 200 mg twice a day, continue to have recurrent  seizure, Keppra 500 mg 2 tablets twice a day was added on, no recurrent seizure since. 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 Other levels alsodemonstrate significant stenosis.. She denies significant neck pain She has mild unsteady gait, always contributed to her left hip problem,  She was evaluated recently  by Sheri Valencia, neurosurgeon, she follows up yearly   She returns for reevaluation and refills. She is on Aricept by PCP Dr. Sherryll Valencia.    UPDATE Jul 05 2017:  I was able to review the letter from her  Sister Sheri Valencia, she is accompanied by her friend Sheri Valencia at today's clinical visit,   "From her sister's letter: Sheri Valencia continue has memory problem, easily gets confused, gets frustrated easily, but deny her problem, there was also description of paranoid, emotional incontinence, forgetful, poor decision-making process for her health, irrational spell, recent purchase of a new car, but difficult to operating her new vehicle, impulsive action, got lost while driving, difficulty working with her computer, loose piece constantly, difficulty managing her bills, ongoing since 2017, progressively getting worse"   Sheri Valencia drove her  here today, she has lived [place] for 50 years, 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   She likes to walk, reading.  She lives in a codominum.    She has not have seizure for few years. 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   UPDATE October 26 2017: She was able to taper off Keppra 500 mg twice daily, only on lamotrigine 200 mg twice a day, doing better, along with Namenda 10 mg twice a day, her memory seems to improve will stabilize, care of her elderly aunt,   We personally reviewed MRI of the brain in May 2019, mild generalized atrophy no acute abnormality,   EEG was normal    Laboratory evaluation in May 2019 showed normal TSH, B12, RPR, CBC, CMP showed mild elevated 1.25, with GFR of 44, Keppra level was 66, lamotrigine level was 22, she has no signs of toxicity, the level was not trough level   Virtual Visit via Video on Jul 11, 2018   She is with her sister Sheri Valencia at visit.  She had no recurrent seizure, taking lamotrigine 200 mg twice a day, Keppra 500 mg twice a day, he complains of increased confusion, sometimes unsteady gait, fell few times, sister also concerned about worsening memory loss, had few incident of mis- handling her debit card,   Previously we have tried to taper off Keppra 500 mg 2 tablets twice a day, because her underweight, has not had recurrent seizure for many years, but she is concerned about potential recurrent seizure, put herself back on Keppra 1 tablets twice a day,   Laboratory evaluations on Jul 05, 2017, normal TSH 1.9, lamotrigine 22.7, Keppra 66.5, normal B12, CBC hemoglobin of  11.1, creatinine of 1.25, potassium of 5.4   Update October 31, 2018: She is with her sister Sheri Valencia at today's visit, she continue to take lamotrigine 200 mg twice a day, has no recurrent seizure, she has worsening memory loss, excessive daytime sleepiness, fatigue, continue have depression, taking Zoloft 100 mg daily, she complains of change of taste, has decreased appetite, with some weight loss,   Laboratory evaluations on Jul 12, 2018 showed lamotrigine level was 18,   UPDATE Apr 05 2019: She is accompanied by her Sister [name] at today's clinical visit, before Christmas, she complains of few days history of poor appetite, frequent diarrhea, on March 03, 2019, she woke up on the floor, has difficulty getting up from the floor, had a transient loss of consciousness," but felt like it is not a seizure", she called ambulance, was brought to the emergency room at [facility], lamotrigine level was 30, I personally reviewed CT head without contrast showed no acute findings UA showed evidence of UTI, her symptoms improved with hydration,   However, she remained confused, weak, difficulty walking was admitted to [facility] on March 08, 2019, personally reviewed record, CT head showed no acute abnormality, CT of cervical spine showed multilevel  degenerative changes, most severe at C5-6, C6 and 7,   She was treated for UTI, planning on to take Macrobid 100 mg daily for 1 month, there was no recurrent seizure activity, she is back on her home regimen of lamotrigine ER 200 mg 2 tablets every night   She does complains of low back pain, continue to feel generalized weakness, gait abnormality, "I have to think about how to walk my leg", also worsening urinary urgency, incontinence   Laboratory evaluation showed mild anemia hemoglobin of 10.8 CMP showed mildly low potassium 3.4, decreased albumin 3.4   She now lives with her sister temporarily, planning on for short-term assisted living placement due to  her continued gait abnormality, worsening confusion,   UPDATE May 14 2019: She is with her sister at today's clinical visit, she is currently lives at at [facility], she had a significant improvement, no gait abnormality, no recurrent seizure, is planning on going home soon.   Acute onset of confusion in December 2020, is most likely related to her UTI, dehydration, polypharmacy treatment, depression anxiety, Lexapro 10 mg daily has been helpful.   We personally reviewed MRI of cervical spine on April 29, 2019: Multilevel degenerative changes, most severe at C5-6, with rightward disc protrusion, borderline mild spinal stenosis, no cord signal changes, severe right foraminal stenosis,   UPDATE Sept 13 2022: She had a breakthrough seizure on November 30, 2019, and May 02, 2020, both seizure happened while her sister was driving, she suddenly become required, forceful head turning to the right, neck hyperextension, body tonic-clonic movement,  She is now back on lamotrigine XR 250 mg every night, Keppra 500 mg twice a day was added on, she tolerated it well, there was no recurrent seizure   UPDATE August 19 2021: [facility] for vacation, she had a seizure on Jul 18, 2021, she finished taking a shower, stepping out, and loss of consciousness  Her sister heard a thump, but could not open the door, she was able to pick through the opening, patient had body jerking movement,  Paramedic was called, was treated at local hospital,   I reviewed her hospital record, blood pressure 173/77, temperature 98.1 pulse 100, laboratory evaluation showed mild elevated WBC 10.4, hemoglobin 11.4, creatinine 1, glucose 134 otherwise normal CMP, urine showed WBC 26-50, moderate epithelial cells,  CT head showed no acute intracranial pathology, CT angiogram showed 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, otherwise patent neck vasculature, no intracranial large vessel occlusion  CT of  cervical region showed no fracture,, multilevel level of degenerative disease, grade 1 anterolisthesis of C4 on 5,  EKG showed normal sinus rhythm  She was treated with azithromycin, ceftriaxone for UTI   Last antiepileptic level was in September 2022 with current dose, Keppra level was 39.8, lamotrigine level was 13.7 She complains of worsening depression anxiety, crying spells since seizure, was given Wellbutrin, she worried about the side effect of lowered seizure threshold, is not taking it anymore,  In addition she complains of worsening gait abnormality, urinary frequency urgency since recurrent seizure and fall in May 2023  Update 09/29/21 SS: Here today with her sister, no recurrent seizure, on Lamictal XR 50 mg AM/250 mg PM, Onfi 2.5 mg at bedtime.  Does have fatigue, takes a nap, not convinced medication related.  Sister thinks she is doing great.  Denies significant neck pain, has urinary urgency, no falls, or gait changes.  EEG was normal 08/24/21, MRI of cervical spine showed  multilevel degenerative changes, at C5-6 severe right foraminal narrowing and moderate left, potential for right C6 nerve root compression, also C7 nerve root compression potential.  IMPRESSION: This MRI of the cervical spine without contrast shows the following: 1.  At C3-C4, there is mild spinal stenosis and moderate bilateral foraminal narrowing but no nerve root compression. 2.  At C4-C5, there is minimal anterolisthesis and other degenerative changes causing moderate left foraminal narrowing but no spinal stenosis or nerve root compression. 3.  At C5-C6, there is a right paramedian disc protrusion and other degenerative change causing mild spinal stenosis and severe right foraminal narrowing and moderate left foraminal narrowing.  There is potential for right C6 nerve root compression. 4.  At C6-C7, there are degenerative changes causing mild spinal stenosis and moderately severe right greater than left  foraminal narrowing.  There is potential for C7 nerve root compression to either side. 5.  Degenerative changes are stable compared to the 04/29/2019 MRI.  Update January 19, 2022 SS: No seizures, living alone, her sister drove her, remains on Onfi 2.5 mg at bedtime, Lamictal XR 50 mg in AM, 250 mg PM. Denies any problems or concerns. Saw PCP earlier today with recurrent UTI, started antibiotic. 09/29/21 Lamictal level was 11.6.   Update April 19, 2022 SS: Has been placed on Wellbutrin in the last month for depression, has made her sleepy, she mentions she read that she shouldn't take since seizures, I would agree. No seizures reports. She is living alone, Sheri Valencia brought her. Is still taking her Onfi 2.5 mg at bedtime. No falls, but is cautious.  I called the pharmacy, she picked up Onfi 2.5 mg 01/24/22 120 ml, she takes 1 ml at bedtime, should last 4 months, they need refills.   REVIEW OF SYSTEMS: Out of a complete 14 system review of symptoms, the patient complains only of the following symptoms, and all other reviewed systems are negative.  See HPI  ALLERGIES: Allergies  Allergen Reactions   Ativan [Lorazepam]     confusion    HOME MEDICATIONS: Outpatient Medications Prior to Visit  Medication Sig Dispense Refill   alendronate (FOSAMAX) 70 MG tablet Take 70 mg by mouth once a week.     aspirin EC 81 MG tablet Take 81 mg by mouth daily.     buPROPion ER (WELLBUTRIN SR) 100 MG 12 hr tablet Take 100 mg by mouth every morning.     LamoTRIgine (LAMICTAL XR) 25 MG TB24 24 hour tablet Take 2 tablets (50 mg total) by mouth daily. 60 tablet 11   LamoTRIgine 250 MG TB24 24 hour tablet Take 250 mg by mouth at bedtime. 90 tablet 4   memantine (NAMENDA) 10 MG tablet Take 1 tablet (10 mg total) by mouth 2 (two) times daily. 180 tablet 3   rosuvastatin (CRESTOR) 5 MG tablet Take 10 mg by mouth daily.     sertraline (ZOLOFT) 100 MG tablet Take 100 mg by mouth daily.     No facility-administered  medications prior to visit.    PAST MEDICAL HISTORY: Past Medical History:  Diagnosis Date   Memory loss    Seizure (HCC)     PAST SURGICAL HISTORY: Past Surgical History:  Procedure Laterality Date   APPENDECTOMY     TUBAL LIGATION      FAMILY HISTORY: Family History  Problem Relation Age of Onset   Pneumonia Mother    Heart attack Maternal Grandmother    Stroke Maternal Grandmother    Suicidality Brother  Aneurysm Maternal Aunt    Breast cancer Sister        04/2015  Stage I    SOCIAL HISTORY: Social History   Socioeconomic History   Marital status: Widowed    Spouse name: Not on file   Number of children: 0   Years of education: college   Highest education level: Not on file  Occupational History    Employer: RETIRED    Comment: retired  Tobacco Use   Smoking status: Never   Smokeless tobacco: Never  Substance and Sexual Activity   Alcohol use: Yes    Alcohol/week: 1.0 standard drink of alcohol    Types: 1 Glasses of wine per week   Drug use: No   Sexual activity: Not Currently  Other Topics Concern   Not on file  Social History Narrative   Patient lives at home alone and she is widowed. Retired.   College education    Caffeine one cup daily.         Social Determinants of Health   Financial Resource Strain: Not on file  Food Insecurity: Not on file  Transportation Needs: Not on file  Physical Activity: Not on file  Stress: Not on file  Social Connections: Not on file  Intimate Partner Violence: Not on file   PHYSICAL EXAM  Vitals:   04/19/22 1416 04/19/22 1417 04/19/22 1442  BP: (!) 173/73 (!) 173/71 (!) 140/80  Pulse:  80   Weight:  105 lb (47.6 kg)   Height:  5\' 1"  (1.549 m)      Body mass index is 19.84 kg/m.    01/19/2022   10:00 AM 05/07/2020    9:21 AM 12/04/2019    8:17 AM  MMSE - Mini Mental State Exam  Orientation to time 5 5 5   Orientation to Place 5 5 5   Registration 3 3 3   Attention/ Calculation 5 5 5   Recall 2  2 3   Language- name 2 objects 2 2 2   Language- repeat 1 1 1   Language- follow 3 step command 3 3 3   Language- read & follow direction 1 1 1   Write a sentence 1 1 1   Copy design 1 1 0  Total score 29 29 29     Generalized: Well developed, in no acute distress  Neurological examination  Mentation: Alert oriented to time, place, history taking. Follows all commands speech and language fluent Cranial nerve II-XII: Pupils were equal round reactive to light. Extraocular movements were full, visual field were full on confrontational test. Facial sensation and strength were normal.  Head turning and shoulder shrug  were normal and symmetric. Motor: The motor testing reveals 5 over 5 strength of all 4 extremities. Good symmetric motor tone is noted throughout.  Sensory: Sensory testing is intact to soft touch on all 4 extremities. No evidence of extinction is noted.  Coordination: Cerebellar testing reveals good finger-nose-finger and heel-to-shin bilaterally.  Gait and station: Gait is normal.  Slightly wide-based. Reflexes: Deep tendon reflexes are symmetric increased at the knees  DIAGNOSTIC DATA (LABS, IMAGING, TESTING) - I reviewed patient records, labs, notes, testing and imaging myself where available.  Lab Results  Component Value Date   WBC 9.1 12/04/2019   HGB 11.4 12/04/2019   HCT 34.5 12/04/2019   MCV 87 12/04/2019   PLT 307 12/04/2019      Component Value Date/Time   NA 137 12/04/2019 0849   K 5.3 (H) 12/04/2019 0849   CL 101 12/04/2019 0849  CO2 24 12/04/2019 0849   GLUCOSE 94 12/04/2019 0849   GLUCOSE 122 (H) 03/03/2019 0916   BUN 12 12/04/2019 0849   CREATININE 1.05 (H) 12/04/2019 0849   CALCIUM 9.6 12/04/2019 0849   PROT 7.1 12/04/2019 0849   ALBUMIN 4.3 12/04/2019 0849   AST 33 12/04/2019 0849   ALT 23 12/04/2019 0849   ALKPHOS 123 (H) 12/04/2019 0849   BILITOT 0.2 12/04/2019 0849   GFRNONAA 53 (L) 12/04/2019 0849   GFRAA 61 12/04/2019 0849   No results  found for: "CHOL", "HDL", "LDLCALC", "LDLDIRECT", "TRIG", "CHOLHDL" No results found for: "HGBA1C" Lab Results  Component Value Date   VITAMINB12 865 07/05/2017   Lab Results  Component Value Date   TSH 3.360 11/17/2020    Margie Ege, AGNP-C, DNP 04/19/2022, 2:42 PM [facility] [address], [address] [place], [place] [address] [phone]

## 2022-04-19 NOTE — Patient Instructions (Addendum)
I would discuss with your primary doctor about the Wellbutrin, I wouldn't recommend taking since you have a history of seizures I will check about Onfi, you should be taking 2.5 mg at bedtime, continue the Lamictal  See you back in 6 months

## 2022-04-27 DIAGNOSIS — I10 Essential (primary) hypertension: Secondary | ICD-10-CM | POA: Diagnosis not present

## 2022-04-27 DIAGNOSIS — G309 Alzheimer's disease, unspecified: Secondary | ICD-10-CM | POA: Diagnosis not present

## 2022-04-27 DIAGNOSIS — Z299 Encounter for prophylactic measures, unspecified: Secondary | ICD-10-CM | POA: Diagnosis not present

## 2022-04-27 DIAGNOSIS — F0280 Dementia in other diseases classified elsewhere without behavioral disturbance: Secondary | ICD-10-CM | POA: Diagnosis not present

## 2022-04-27 DIAGNOSIS — Z681 Body mass index (BMI) 19 or less, adult: Secondary | ICD-10-CM | POA: Diagnosis not present

## 2022-04-27 DIAGNOSIS — L0390 Cellulitis, unspecified: Secondary | ICD-10-CM | POA: Diagnosis not present

## 2022-05-13 DIAGNOSIS — E2839 Other primary ovarian failure: Secondary | ICD-10-CM | POA: Diagnosis not present

## 2022-06-20 DIAGNOSIS — S81811A Laceration without foreign body, right lower leg, initial encounter: Secondary | ICD-10-CM | POA: Diagnosis not present

## 2022-06-20 DIAGNOSIS — I10 Essential (primary) hypertension: Secondary | ICD-10-CM | POA: Diagnosis not present

## 2022-06-20 DIAGNOSIS — Z299 Encounter for prophylactic measures, unspecified: Secondary | ICD-10-CM | POA: Diagnosis not present

## 2022-06-27 DIAGNOSIS — S81811A Laceration without foreign body, right lower leg, initial encounter: Secondary | ICD-10-CM | POA: Diagnosis not present

## 2022-06-27 DIAGNOSIS — I10 Essential (primary) hypertension: Secondary | ICD-10-CM | POA: Diagnosis not present

## 2022-06-27 DIAGNOSIS — Z299 Encounter for prophylactic measures, unspecified: Secondary | ICD-10-CM | POA: Diagnosis not present

## 2022-06-28 ENCOUNTER — Ambulatory Visit (INDEPENDENT_AMBULATORY_CARE_PROVIDER_SITE_OTHER): Payer: PPO | Admitting: Gastroenterology

## 2022-06-28 ENCOUNTER — Encounter (INDEPENDENT_AMBULATORY_CARE_PROVIDER_SITE_OTHER): Payer: Self-pay | Admitting: Gastroenterology

## 2022-06-28 ENCOUNTER — Encounter: Payer: Self-pay | Admitting: *Deleted

## 2022-06-28 VITALS — BP 147/63 | HR 79 | Temp 98.8°F | Ht 61.0 in | Wt 102.8 lb

## 2022-06-28 DIAGNOSIS — K5900 Constipation, unspecified: Secondary | ICD-10-CM

## 2022-06-28 DIAGNOSIS — R195 Other fecal abnormalities: Secondary | ICD-10-CM | POA: Diagnosis not present

## 2022-06-28 NOTE — Progress Notes (Unsigned)
Referring Provider: Kirstie Peri, MD Primary Care Physician:  Kirstie Peri, MD Primary GI Physician: Levon Hedger   Chief Complaint  Patient presents with   heme positive stool    Patient seen in January for heme positive stool and never got colonoscopy scheduled. Would like to go forward with procedure. Stools have changed in size from large to small.    HPI:   Sheri Valencia is a 77 y.o. female with past medical history of memory loss, seizures htn, high cholesterol, vitamin d deficiency, depression.    Patient presenting today for follow up of heme positive stool   Last seen janaury 2024, at that time presenting for heme positive stool,  Last labs with hgb 11.5 and heme positive stool in August 2023 had not had a colonoscopy in many years. No rectal bleeding or melena. She notes that more recently she may go a few days without a BM and then will go a few days where she may have multiple stools that sometimes hard. No abdominal pain. No weight loss, changes in appetite, nausea or vomiting. Feels that water intake is good. Does get a decent amount of fruits and veggies in her diet as well. She does not take anything for constipation.    Recommended colonoscopy, benefiber and good water intake. We were unable to reach patient to schedule colonoscopy.  Present:  Patient reports that she does not feel constipated often, stools range from large to smaller in volume. She is not taking anything for constipation, tries to eat more cereal and fruit. No blood in stools or abdominal pain. She denies any weight loss. No changes in appetite. No nausea or vomiting. She states she was unaware that we were trying to schedule her for colonoscopy from previous OV    Last Colonoscopy:>10 years  Last Endoscopy: never  Recommendations:    Past Medical History:  Diagnosis Date   Memory loss    Seizure    Past Surgical History:  Procedure Laterality Date   APPENDECTOMY     TUBAL LIGATION      Current  Outpatient Medications  Medication Sig Dispense Refill   alendronate (FOSAMAX) 70 MG tablet Take 70 mg by mouth once a week.     aspirin EC 81 MG tablet Take 81 mg by mouth daily.     cloBAZam (ONFI) 2.5 MG/ML solution Take 1 mL (2.5 mg total) by mouth at bedtime. 120 mL 0   LamoTRIgine (LAMICTAL XR) 25 MG TB24 24 hour tablet Take 2 tablets (50 mg total) by mouth daily. 60 tablet 11   LamoTRIgine 250 MG TB24 24 hour tablet Take 250 mg by mouth at bedtime. 90 tablet 4   memantine (NAMENDA) 10 MG tablet Take 1 tablet (10 mg total) by mouth 2 (two) times daily. 180 tablet 3   rosuvastatin (CRESTOR) 5 MG tablet Take 10 mg by mouth daily.     sertraline (ZOLOFT) 100 MG tablet Take 100 mg by mouth daily.     buPROPion ER (WELLBUTRIN SR) 100 MG 12 hr tablet Take 100 mg by mouth every morning. (Patient not taking: Reported on 06/28/2022)     No current facility-administered medications for this visit.    Allergies as of 06/28/2022 - Review Complete 06/28/2022  Allergen Reaction Noted   Ativan [lorazepam]  06/26/2014    Family History  Problem Relation Age of Onset   Pneumonia Mother    Heart attack Maternal Grandmother    Stroke Maternal Grandmother    Suicidality Brother  Aneurysm Maternal Aunt    Breast cancer Sister        04/2015  Stage I    Social History   Socioeconomic History   Marital status: Widowed    Spouse name: Not on file   Number of children: 0   Years of education: college   Highest education level: Not on file  Occupational History    Employer: RETIRED    Comment: retired  Tobacco Use   Smoking status: Never    Passive exposure: Never   Smokeless tobacco: Never  Substance and Sexual Activity   Alcohol use: Yes    Alcohol/week: 1.0 standard drink of alcohol    Types: 1 Glasses of wine per week   Drug use: No   Sexual activity: Not Currently  Other Topics Concern   Not on file  Social History Narrative   Patient lives at home alone and she is widowed.  Retired.   College education    Caffeine one cup daily.         Social Determinants of Health   Financial Resource Strain: Not on file  Food Insecurity: Not on file  Transportation Needs: Not on file  Physical Activity: Not on file  Stress: Not on file  Social Connections: Not on file   Review of systems General: negative for malaise, night sweats, fever, chills, weight loss Neck: Negative for lumps, goiter, pain and significant neck swelling Resp: Negative for cough, wheezing, dyspnea at rest CV: Negative for chest pain, leg swelling, palpitations, orthopnea GI: denies melena, hematochezia, nausea, vomiting, diarrhea, constipation, dysphagia, odyonophagia, early satiety or unintentional weight loss.  MSK: Negative for joint pain or swelling, back pain, and muscle pain. Derm: Negative for itching or rash Psych: Denies depression, anxiety, memory loss, confusion. No homicidal or suicidal ideation.  Heme: Negative for prolonged bleeding, bruising easily, and swollen nodes. Endocrine: Negative for cold or heat intolerance, polyuria, polydipsia and goiter. Neuro: negative for tremor, gait imbalance, syncope and seizures. The remainder of the review of systems is noncontributory.  Physical Exam: BP (!) 147/63   Pulse 79   Temp 98.8 F (37.1 C) (Oral)   Ht  (1.549 m)   Wt 102 lb 12.8 oz (46.6 kg)   BMI 19.42 kg/m  General:   Alert and oriented. No distress noted. Pleasant and cooperative.  Head:  Normocephalic and atraumatic. Eyes:  Conjuctiva clear without scleral icterus. Mouth:  Oral mucosa pink and moist. Good dentition. No lesions. Heart: Normal rate and rhythm, s1 and s2 heart sounds present.  Lungs: Clear lung sounds in all lobes. Respirations equal and unlabored. Abdomen:  +BS, soft, non-tender and non-distended. No rebound or guarding. No HSM or masses noted. Derm: No palmar erythema or jaundice Msk:  Symmetrical without gross deformities. Normal  posture. Extremities:  Without edema. Neurologic:  Alert and  oriented x4 Psych:  Alert and cooperative. Normal mood and affect.  Invalid input(s): "6 MONTHS"   ASSESSMENT: Sheri Valencia is a 77 y.o. female presenting today for follow up of heme positive stool  Seen in january for heme positive stool and recommended to proceed with colonoscopy, however, she was lost to follow up due to a few failed attempts by our office to get in touch with her for scheduling. She notes today that she forgot she was supposed to have colonoscopy done. She is still amenable to proceeding with this. We will try to get her scheduled today in the office.   Constipation is well controlled  with dietary measures, she should continue with diet high in fruits, veggies, whole grains and good water intake.   PLAN:  Schedule colonoscopy ASA III 2. Increase water intake, aim for atleast 64 oz per day Increase fruits, veggies and whole grains, kiwi and prunes are especially good for constipation  All questions were answered, patient verbalized understanding and is in agreement with plan as outlined above.   Follow Up: 3 months   Yussuf Sawyers L. Jeanmarie Hubert, MSN, APRN, AGNP-C Adult-Gerontology Nurse Practitioner [facility]  I have reviewed the note and agree with the APP's assessment as described in this progress note  Katrinka Blazing, MD Gastroenterology and Hepatology [facility]

## 2022-06-28 NOTE — Patient Instructions (Signed)
We will schedule you for the colonoscopy Increase water intake, aim for atleast 64 oz per day Increase fruits, veggies and whole grains, kiwi and prunes are especially good for constipation  Follow up 3 months

## 2022-06-29 ENCOUNTER — Encounter: Payer: Self-pay | Admitting: *Deleted

## 2022-06-29 NOTE — Addendum Note (Signed)
Addended by: Dolores Frame on: 06/29/2022 07:17 PM   Modules accepted: Level of Service

## 2022-07-20 ENCOUNTER — Ambulatory Visit: Payer: PPO | Admitting: Neurology

## 2022-07-20 NOTE — Telephone Encounter (Signed)
This encounter was created in error - please disregard.

## 2022-08-12 ENCOUNTER — Other Ambulatory Visit: Payer: Self-pay

## 2022-08-12 ENCOUNTER — Encounter (HOSPITAL_COMMUNITY)
Admission: RE | Admit: 2022-08-12 | Discharge: 2022-08-12 | Disposition: A | Discharge status: Home / Self Care | Payer: PPO | Source: Ambulatory Visit | Attending: Gastroenterology | Admitting: Gastroenterology

## 2022-08-12 ENCOUNTER — Encounter (HOSPITAL_COMMUNITY): Payer: Self-pay

## 2022-08-12 HISTORY — DX: Depression, unspecified: F32.A

## 2022-08-16 ENCOUNTER — Encounter (HOSPITAL_COMMUNITY)
Admission: RE | Disposition: A | Discharge status: Home / Self Care | Payer: Self-pay | Source: Home / Self Care | Attending: Gastroenterology

## 2022-08-16 ENCOUNTER — Encounter (INDEPENDENT_AMBULATORY_CARE_PROVIDER_SITE_OTHER): Payer: Self-pay | Admitting: *Deleted

## 2022-08-16 ENCOUNTER — Ambulatory Visit (HOSPITAL_COMMUNITY): Payer: PPO | Admitting: Anesthesiology

## 2022-08-16 ENCOUNTER — Ambulatory Visit (HOSPITAL_BASED_OUTPATIENT_CLINIC_OR_DEPARTMENT_OTHER): Payer: PPO | Admitting: Anesthesiology

## 2022-08-16 ENCOUNTER — Encounter (HOSPITAL_COMMUNITY): Payer: Self-pay | Admitting: Gastroenterology

## 2022-08-16 ENCOUNTER — Ambulatory Visit (HOSPITAL_COMMUNITY)
Admission: RE | Admit: 2022-08-16 | Discharge: 2022-08-16 | Disposition: A | Discharge status: Home / Self Care | Payer: PPO | Attending: Gastroenterology | Admitting: Gastroenterology

## 2022-08-16 DIAGNOSIS — I10 Essential (primary) hypertension: Secondary | ICD-10-CM | POA: Insufficient documentation

## 2022-08-16 DIAGNOSIS — R194 Change in bowel habit: Secondary | ICD-10-CM | POA: Diagnosis not present

## 2022-08-16 DIAGNOSIS — D124 Benign neoplasm of descending colon: Secondary | ICD-10-CM | POA: Diagnosis not present

## 2022-08-16 DIAGNOSIS — D122 Benign neoplasm of ascending colon: Secondary | ICD-10-CM | POA: Insufficient documentation

## 2022-08-16 DIAGNOSIS — D630 Anemia in neoplastic disease: Secondary | ICD-10-CM

## 2022-08-16 DIAGNOSIS — R413 Other amnesia: Secondary | ICD-10-CM | POA: Insufficient documentation

## 2022-08-16 DIAGNOSIS — R195 Other fecal abnormalities: Secondary | ICD-10-CM | POA: Diagnosis not present

## 2022-08-16 DIAGNOSIS — K635 Polyp of colon: Secondary | ICD-10-CM | POA: Diagnosis not present

## 2022-08-16 DIAGNOSIS — F32A Depression, unspecified: Secondary | ICD-10-CM | POA: Diagnosis not present

## 2022-08-16 DIAGNOSIS — Q438 Other specified congenital malformations of intestine: Secondary | ICD-10-CM | POA: Diagnosis not present

## 2022-08-16 DIAGNOSIS — D120 Benign neoplasm of cecum: Secondary | ICD-10-CM | POA: Insufficient documentation

## 2022-08-16 DIAGNOSIS — D126 Benign neoplasm of colon, unspecified: Secondary | ICD-10-CM

## 2022-08-16 DIAGNOSIS — E7800 Pure hypercholesterolemia, unspecified: Secondary | ICD-10-CM | POA: Diagnosis not present

## 2022-08-16 DIAGNOSIS — R569 Unspecified convulsions: Secondary | ICD-10-CM | POA: Diagnosis not present

## 2022-08-16 DIAGNOSIS — D123 Benign neoplasm of transverse colon: Secondary | ICD-10-CM | POA: Diagnosis not present

## 2022-08-16 HISTORY — PX: HEMOSTASIS CLIP PLACEMENT: SHX6857

## 2022-08-16 HISTORY — PX: COLONOSCOPY WITH PROPOFOL: SHX5780

## 2022-08-16 HISTORY — PX: SUBMUCOSAL TATTOO INJECTION: SHX6856

## 2022-08-16 HISTORY — PX: POLYPECTOMY: SHX5525

## 2022-08-16 HISTORY — PX: SCLEROTHERAPY: SHX6841

## 2022-08-16 HISTORY — PX: BIOPSY: SHX5522

## 2022-08-16 LAB — HM COLONOSCOPY

## 2022-08-16 SURGERY — COLONOSCOPY WITH PROPOFOL
Anesthesia: General

## 2022-08-16 MED ORDER — PHENYLEPHRINE 80 MCG/ML (10ML) SYRINGE FOR IV PUSH (FOR BLOOD PRESSURE SUPPORT)
PREFILLED_SYRINGE | INTRAVENOUS | Status: DC | PRN
  Administered 2022-08-16 (×4): 80 ug via INTRAVENOUS

## 2022-08-16 MED ORDER — SPOT INK MARKER SYRINGE KIT
PACK | SUBMUCOSAL | Status: DC | PRN
  Administered 2022-08-16: 1 mL via SUBMUCOSAL

## 2022-08-16 MED ORDER — PROPOFOL 10 MG/ML IV BOLUS
INTRAVENOUS | Status: DC | PRN
  Administered 2022-08-16: 60 mg via INTRAVENOUS

## 2022-08-16 MED ORDER — LACTATED RINGERS IV SOLN: INTRAVENOUS | Status: DC | PRN

## 2022-08-16 MED ORDER — PROPOFOL 500 MG/50ML IV EMUL
INTRAVENOUS | Status: AC
  Filled 2022-08-16: qty 50

## 2022-08-16 MED ORDER — PROPOFOL 500 MG/50ML IV EMUL
INTRAVENOUS | Status: DC | PRN
  Administered 2022-08-16: 150 ug/kg/min via INTRAVENOUS

## 2022-08-16 MED ORDER — PHENYLEPHRINE 80 MCG/ML (10ML) SYRINGE FOR IV PUSH (FOR BLOOD PRESSURE SUPPORT)
PREFILLED_SYRINGE | INTRAVENOUS | Status: AC
  Filled 2022-08-16: qty 20

## 2022-08-16 NOTE — Anesthesia Preprocedure Evaluation (Addendum)
Anesthesia Evaluation  Patient identified by MRN, date of birth, ID band Patient awake    Reviewed: Allergy & Precautions, NPO status , Patient's Chart, lab work & pertinent test results, reviewed documented beta blocker date and time   Airway Mallampati: I  TM Distance: >3 FB Neck ROM: Full    Dental  (+) Dental Advisory Given   Pulmonary neg pulmonary ROS   Pulmonary exam normal        Cardiovascular hypertension, + Valvular Problems/Murmurs  Rhythm:Regular Rate:Normal     Neuro/Psych Seizures -, Well Controlled,   Neuromuscular disease    GI/Hepatic Neg liver ROS, Bowel prep,,,  Endo/Other  negative endocrine ROS    Renal/GU negative Renal ROS  negative genitourinary   Musculoskeletal  (+) Arthritis ,    Abdominal Normal abdominal exam  (+)   Peds  Hematology  (+) Blood dyscrasia, anemia   Anesthesia Other Findings   Reproductive/Obstetrics                             Anesthesia Physical Anesthesia Plan  ASA: 3  Anesthesia Plan: General   Post-op Pain Management:    Induction: Intravenous  PONV Risk Score and Plan: Propofol infusion  Airway Management Planned: Nasal Cannula  Additional Equipment:   Intra-op Plan:   Post-operative Plan:   Informed Consent:   Plan Discussed with:   Anesthesia Plan Comments:        Anesthesia Quick Evaluation

## 2022-08-16 NOTE — Op Note (Signed)
[facility] Patient Name: Sheri Valencia Procedure Date: 08/16/2022 7:24 AM MRN: [id number] Date of Birth: 1945/03/14 Attending MD: Katrinka Blazing , , [id number] CSN: [id number] Age: 77 Admit Type: Outpatient Procedure:                Colonoscopy Indications:              Change in bowel habits, positive FOBT Providers:                Katrinka Blazing, Angelica Ran, Zena Amos Referring MD:              Medicines:                Monitored Anesthesia Care Complications:            No immediate complications. Estimated Blood Loss:     Estimated blood loss: none. Procedure:                Pre-Anesthesia Assessment:                           - Prior to the procedure, a History and Physical                            was performed, and patient medications, allergies                            and sensitivities were reviewed. The patient's                            tolerance of previous anesthesia was reviewed.                           - The risks and benefits of the procedure and the                            sedation options and risks were discussed with the                            patient. All questions were answered and informed                            consent was obtained.                           - ASA Grade Assessment: III - A patient with severe                            systemic disease.                           After obtaining informed consent, the colonoscope                            was passed under direct vision. Throughout the                            procedure, the patient's blood  pressure, pulse, and                            oxygen saturations were monitored continuously. The                            [id number]-HQ190L ([id number]) scope was introduced through                            the anus and advanced to the the cecum, identified                            by appendiceal orifice and ileocecal valve. The                            patient tolerated the  procedure well. The quality                            of the bowel preparation was adequate to identify                            polyps greater than 5 mm in size. The colonoscopy                            was somewhat difficult due to a tortuous colon. Scope In: 7:29:14 AM Scope Out: 8:25:53 AM Scope Withdrawal Time: 0 hours 46 minutes 46 seconds  Total Procedure Duration: 0 hours 56 minutes 39 seconds  Findings:      The perianal and digital rectal examinations were normal.      A 6 mm polyp was found in the cecum. The polyp was sessile. The polyp       was removed with a cold snare. Resection and retrieval were complete. To       prevent bleeding after the polypectomy, one hemostatic clip was       successfully placed. Clip manufacturer: [employer]. There was no       bleeding at the end of the procedure.      A 14 mm polyp was found in the ascending colon. The polyp was       semi-sessile. The polyp was removed with a piecemeal technique using a       hot snare. Resection and retrieval were complete. To prevent bleeding       after the polypectomy, one hemostatic clip was successfully placed. Clip       manufacturer: [employer]. There was no bleeding at the end of the       procedure. Area 1 cm distal and contralateral was tattooed with an       injection of 1 mL of [place] ink.      A 5 mm polyp was found in the ascending colon. The polyp was sessile.       The polyp was removed with a cold snare. Resection and retrieval were       complete.      A 12 mm polyp was found in the transverse colon. The polyp was sessile.       The polyp was removed with a hot snare. Resection and retrieval were  complete.      Two sessile polyps were found in the descending colon. The polyps were 5       to 10 mm in size. These polyps were removed with a cold snare. Resection       and retrieval were complete.      The rest of the colon appeared normal. Biopsies for histology were  taken       with a cold forceps from the right colon and left colon for evaluation       of microscopic colitis.      The retroflexed view of the distal rectum and anal verge was normal and       showed no anal or rectal abnormalities. Impression:               - One 6 mm polyp in the cecum, removed with a cold                            snare. Resected and retrieved. Clip was placed.                            Clip manufacturer: [employer].                           - One 14 mm polyp in the ascending colon, removed                            piecemeal using a hot snare. Resected and                            retrieved. Clip was placed. Clip manufacturer:                            [employer]. Tattooed.                           - One 5 mm polyp in the ascending colon, removed                            with a cold snare. Resected and retrieved.                           - One 12 mm polyp in the transverse colon, removed                            with a hot snare. Resected and retrieved.                           - Two 5 to 10 mm polyps in the descending colon,                            removed with a cold snare. Resected and retrieved.                           - The rest of the examined colon is normal.  Biopsied.                           - The distal rectum and anal verge are normal on                            retroflexion view. Moderate Sedation:      Per Anesthesia Care Recommendation:           - Discharge patient to home (ambulatory).                           - Resume previous diet.                           - Await pathology results.                           - Repeat colonoscopy in 12 months for surveillance                            after piecemeal polypectomy. Procedure Code(s):        --- Professional ---                           [phone], Colonoscopy, flexible; with removal of                            tumor(s), polyp(s), or  other lesion(s) by snare                            technique                           45380, 59, Colonoscopy, flexible; with biopsy,                            single or multiple                           45381, Colonoscopy, flexible; with directed                            submucosal injection(s), any substance Diagnosis Code(s):        --- Professional ---                           D12.0, Benign neoplasm of cecum                           D12.2, Benign neoplasm of ascending colon                           D12.3, Benign neoplasm of transverse colon (hepatic                            flexure or splenic flexure)  D12.4, Benign neoplasm of descending colon                           R19.4, Change in bowel habit CPT copyright 2022 American Medical Association. All rights reserved. The codes documented in this report are preliminary and upon coder review may  be revised to meet current compliance requirements. Katrinka Blazing, MD Katrinka Blazing,  08/16/2022 8:35:05 AM This report has been signed electronically. Number of Addenda: 0

## 2022-08-16 NOTE — Discharge Instructions (Signed)
You are being discharged to home.  Resume your previous diet.  We are waiting for your pathology results.  Your physician has recommended a repeat colonoscopy in twelve months for surveillance after today's piecemeal polypectomy.

## 2022-08-16 NOTE — Anesthesia Postprocedure Evaluation (Signed)
Anesthesia Post Note  Patient: Zamyia Gowell Barriere  Procedure(s) Performed: COLONOSCOPY WITH PROPOFOL POLYPECTOMY HEMOSTASIS CLIP PLACEMENT SCLEROTHERAPY SUBMUCOSAL TATTOO INJECTION BIOPSY  Patient location during evaluation: Phase II Anesthesia Type: General Level of consciousness: awake and alert and oriented Pain management: pain level controlled Vital Signs Assessment: post-procedure vital signs reviewed and stable Respiratory status: spontaneous breathing, nonlabored ventilation and respiratory function stable Cardiovascular status: blood pressure returned to baseline and stable Postop Assessment: no apparent nausea or vomiting Anesthetic complications: no  No notable events documented.   Last Vitals:  Vitals:   08/16/22 0640 08/16/22 0832  BP: (!) 169/66 (!) 110/33  Pulse: 77 63  Resp: 18 13  Temp: 36.7 C (!) 36.3 C  SpO2: 100% 99%    Last Pain:  Vitals:   08/16/22 0832  TempSrc: Axillary  PainSc: 0-No pain                 Pearson Picou C Chevy Virgo

## 2022-08-16 NOTE — Transfer of Care (Signed)
Immediate Anesthesia Transfer of Care Note  Patient: Sheri Valencia  Procedure(s) Performed: COLONOSCOPY WITH PROPOFOL POLYPECTOMY HEMOSTASIS CLIP PLACEMENT SCLEROTHERAPY SUBMUCOSAL TATTOO INJECTION BIOPSY  Patient Location: Short Stay  Anesthesia Type:General  Level of Consciousness: drowsy  Airway & Oxygen Therapy: Patient Spontanous Breathing  Post-op Assessment: Report given to RN and Post -op Vital signs reviewed and stable  Post vital signs: Reviewed and stable  Last Vitals:  Vitals Value Taken Time  BP 110/33 08/16/22 0832  Temp 36.3 C 08/16/22 0832  Pulse 63 08/16/22 0832  Resp 13 08/16/22 0832  SpO2 99 % 08/16/22 0832    Last Pain:  Vitals:   08/16/22 0832  TempSrc: Axillary  PainSc: 0-No pain         Complications: No notable events documented.

## 2022-08-16 NOTE — H&P (Signed)
Sheri Valencia is an 77 y.o. female.   Chief Complaint: heme positive stool and change in bowel movement consistency HPI: Sheri Valencia is a 77 y.o. female with past medical history of memory loss, seizures htn, high cholesterol, vitamin d deficiency, depression, coming for heme positive  stool and change in bowel movement consistency.  Patient reports feeling well and denies any melena, hematochezia, mental pain, nausea, vomiting, fever, chills.  States that her bowel movements have been loose intermittently occasionally.  Past Medical History:  Diagnosis Date   Depression    Memory loss    Seizure (HCC)    last seizure qwas over 1 year ago.    Past Surgical History:  Procedure Laterality Date   APPENDECTOMY     TUBAL LIGATION      Family History  Problem Relation Age of Onset   Pneumonia Mother    Heart attack Maternal Grandmother    Stroke Maternal Grandmother    Suicidality Brother    Aneurysm Maternal Aunt    Breast cancer Sister        04/2015  Stage I   Social History:  reports that she has never smoked. She has never been exposed to tobacco smoke. She has never used smokeless tobacco. She reports current alcohol use of about 1.0 standard drink of alcohol per week. She reports that she does not use drugs.  Allergies:  Allergies  Allergen Reactions   Ativan [Lorazepam]     confusion    Medications Prior to Admission  Medication Sig Dispense Refill   alendronate (FOSAMAX) 70 MG tablet Take 70 mg by mouth every Friday.     aspirin EC 81 MG tablet Take 81 mg by mouth every 6 (six) hours as needed for moderate pain.     cloBAZam (ONFI) 2.5 MG/ML solution Take 1 mL (2.5 mg total) by mouth at bedtime. 120 mL 0   LamoTRIgine (LAMICTAL XR) 25 MG TB24 24 hour tablet Take 2 tablets (50 mg total) by mouth daily. 60 tablet 11   LamoTRIgine 250 MG TB24 24 hour tablet Take 250 mg by mouth at bedtime. 90 tablet 4   memantine (NAMENDA) 10 MG tablet Take 1 tablet (10 mg total) by mouth  2 (two) times daily. 180 tablet 3   rosuvastatin (CRESTOR) 5 MG tablet Take 10 mg by mouth daily.     sertraline (ZOLOFT) 100 MG tablet Take 100 mg by mouth daily.      No results found for this or any previous visit (from the past 48 hour(s)). No results found.  Review of Systems  All other systems reviewed and are negative.   Blood pressure (!) 169/66, pulse 77, temperature 98 F (36.7 C), temperature source Oral, resp. rate 18, height 5\' 1"  (1.549 m), weight 46.3 kg, SpO2 100 %. Physical Exam  GENERAL: The patient is AO x3, in no acute distress. HEENT: Head is normocephalic and atraumatic. EOMI are intact. Mouth is well hydrated and without lesions. NECK: Supple. No masses LUNGS: Clear to auscultation. No presence of rhonchi/wheezing/rales. Adequate chest expansion HEART: RRR, normal s1 and s2. ABDOMEN: Soft, nontender, no guarding, no peritoneal signs, and nondistended. BS +. No masses. EXTREMITIES: Without any cyanosis, clubbing, rash, lesions or edema. NEUROLOGIC: AOx3, no focal motor deficit. SKIN: no jaundice, no rashes  Assessment/Plan  Sheri Valencia is a 77 y.o. female with past medical history of memory loss, seizures htn, high cholesterol, vitamin d deficiency, depression, coming for heme positive  stool and change  in bowel movement consistency.  Will proceed with colonoscopy.  Dolores Frame, MD 08/16/2022, 7:21 AM

## 2022-08-17 LAB — SURGICAL PATHOLOGY

## 2022-08-23 ENCOUNTER — Encounter (HOSPITAL_COMMUNITY): Payer: Self-pay | Admitting: Gastroenterology

## 2022-09-02 DIAGNOSIS — R350 Frequency of micturition: Secondary | ICD-10-CM | POA: Diagnosis not present

## 2022-09-02 DIAGNOSIS — N390 Urinary tract infection, site not specified: Secondary | ICD-10-CM | POA: Diagnosis not present

## 2022-09-02 DIAGNOSIS — Z6820 Body mass index (BMI) 20.0-20.9, adult: Secondary | ICD-10-CM | POA: Diagnosis not present

## 2022-09-13 ENCOUNTER — Other Ambulatory Visit: Payer: Self-pay | Admitting: Neurology

## 2022-09-30 DIAGNOSIS — N3090 Cystitis, unspecified without hematuria: Secondary | ICD-10-CM | POA: Diagnosis not present

## 2022-09-30 DIAGNOSIS — I10 Essential (primary) hypertension: Secondary | ICD-10-CM | POA: Diagnosis not present

## 2022-09-30 DIAGNOSIS — R350 Frequency of micturition: Secondary | ICD-10-CM | POA: Diagnosis not present

## 2022-09-30 DIAGNOSIS — Z299 Encounter for prophylactic measures, unspecified: Secondary | ICD-10-CM | POA: Diagnosis not present

## 2022-10-05 DIAGNOSIS — I10 Essential (primary) hypertension: Secondary | ICD-10-CM | POA: Diagnosis not present

## 2022-10-05 DIAGNOSIS — Z299 Encounter for prophylactic measures, unspecified: Secondary | ICD-10-CM | POA: Diagnosis not present

## 2022-10-05 DIAGNOSIS — R350 Frequency of micturition: Secondary | ICD-10-CM | POA: Diagnosis not present

## 2022-10-05 DIAGNOSIS — N3090 Cystitis, unspecified without hematuria: Secondary | ICD-10-CM | POA: Diagnosis not present

## 2022-10-06 DIAGNOSIS — Z299 Encounter for prophylactic measures, unspecified: Secondary | ICD-10-CM | POA: Diagnosis not present

## 2022-10-06 DIAGNOSIS — N3090 Cystitis, unspecified without hematuria: Secondary | ICD-10-CM | POA: Diagnosis not present

## 2022-10-06 DIAGNOSIS — I10 Essential (primary) hypertension: Secondary | ICD-10-CM | POA: Diagnosis not present

## 2022-10-07 DIAGNOSIS — Z299 Encounter for prophylactic measures, unspecified: Secondary | ICD-10-CM | POA: Diagnosis not present

## 2022-10-07 DIAGNOSIS — M5442 Lumbago with sciatica, left side: Secondary | ICD-10-CM | POA: Diagnosis not present

## 2022-10-07 DIAGNOSIS — N3090 Cystitis, unspecified without hematuria: Secondary | ICD-10-CM | POA: Diagnosis not present

## 2022-10-07 DIAGNOSIS — I10 Essential (primary) hypertension: Secondary | ICD-10-CM | POA: Diagnosis not present

## 2022-10-11 DIAGNOSIS — Z96642 Presence of left artificial hip joint: Secondary | ICD-10-CM | POA: Diagnosis not present

## 2022-10-11 DIAGNOSIS — R1031 Right lower quadrant pain: Secondary | ICD-10-CM | POA: Diagnosis not present

## 2022-10-11 DIAGNOSIS — R319 Hematuria, unspecified: Secondary | ICD-10-CM | POA: Diagnosis not present

## 2022-10-11 DIAGNOSIS — Z299 Encounter for prophylactic measures, unspecified: Secondary | ICD-10-CM | POA: Diagnosis not present

## 2022-10-11 DIAGNOSIS — I10 Essential (primary) hypertension: Secondary | ICD-10-CM | POA: Diagnosis not present

## 2022-10-11 DIAGNOSIS — R569 Unspecified convulsions: Secondary | ICD-10-CM | POA: Diagnosis not present

## 2022-10-11 DIAGNOSIS — M545 Low back pain, unspecified: Secondary | ICD-10-CM | POA: Diagnosis not present

## 2022-10-14 ENCOUNTER — Other Ambulatory Visit (HOSPITAL_COMMUNITY): Payer: Self-pay | Admitting: Family Medicine

## 2022-10-14 DIAGNOSIS — N2889 Other specified disorders of kidney and ureter: Secondary | ICD-10-CM | POA: Diagnosis not present

## 2022-10-14 DIAGNOSIS — R410 Disorientation, unspecified: Secondary | ICD-10-CM

## 2022-10-14 DIAGNOSIS — R319 Hematuria, unspecified: Secondary | ICD-10-CM | POA: Diagnosis not present

## 2022-10-14 DIAGNOSIS — I10 Essential (primary) hypertension: Secondary | ICD-10-CM | POA: Diagnosis not present

## 2022-10-14 DIAGNOSIS — R102 Pelvic and perineal pain: Secondary | ICD-10-CM

## 2022-10-14 DIAGNOSIS — Z79899 Other long term (current) drug therapy: Secondary | ICD-10-CM | POA: Diagnosis not present

## 2022-10-14 DIAGNOSIS — Z299 Encounter for prophylactic measures, unspecified: Secondary | ICD-10-CM | POA: Diagnosis not present

## 2022-10-14 DIAGNOSIS — R52 Pain, unspecified: Secondary | ICD-10-CM | POA: Diagnosis not present

## 2022-10-18 ENCOUNTER — Ambulatory Visit (INDEPENDENT_AMBULATORY_CARE_PROVIDER_SITE_OTHER): Payer: PPO | Admitting: Gastroenterology

## 2022-10-24 DIAGNOSIS — Z299 Encounter for prophylactic measures, unspecified: Secondary | ICD-10-CM | POA: Diagnosis not present

## 2022-10-24 DIAGNOSIS — M4696 Unspecified inflammatory spondylopathy, lumbar region: Secondary | ICD-10-CM | POA: Diagnosis not present

## 2022-10-24 DIAGNOSIS — G309 Alzheimer's disease, unspecified: Secondary | ICD-10-CM | POA: Diagnosis not present

## 2022-10-24 DIAGNOSIS — I10 Essential (primary) hypertension: Secondary | ICD-10-CM | POA: Diagnosis not present

## 2022-10-24 DIAGNOSIS — R319 Hematuria, unspecified: Secondary | ICD-10-CM | POA: Diagnosis not present

## 2022-10-27 NOTE — Progress Notes (Signed)
Patient: Sheri Valencia Date of Birth: 27-Feb-1946  Reason for Visit: Follow up History from: Patient, sister Primary Neurologist: Dr. Terrace Valencia   ASSESSMENT AND PLAN 77 y.o. year old female   1.  Complex partial seizure with secondary generalization -No recent seizure since Jul 18, 2021 (on Keppra 500 mg twice a day, Lamictal XR 250 mg daily) -Continue Lamictal XR 50 mg a.m., 250 mg p.m., Onfi 2.5 mg at bedtime (taper off Keppra June 2023) -Check Lamictal level -EEG was normal  2.  History of cervical spondylosis -Denies any worsening neck pain, gait or balance -MRI cervical spine 08/30/21 degenerative changes throughout, most at C5-C6 with severe right foraminal narrowing, potential for right C6 nerve root compression; also C6-C7 severe right greater than left foraminal narrowing, potential for C7 nerve root compression -Monitor symptoms, signs of worsening  3.  Mild cognitive impairment -MMSE 25/30 (29/30), continue Namenda 10 mg twice a day -Discussed continuing to monitor for safety, close monitor driving  Follow-up with me in 1 year, sooner if needed.  Call for any seizures.  Meds ordered this encounter  Medications   cloBAZam (ONFI) 2.5 MG/ML solution    Sig: Take 1 mL (2.5 mg total) by mouth at bedtime.    Dispense:  90 mL    Refill:  1   LamoTRIgine 250 MG TB24 24 hour tablet    Sig: Take 1 tablet (250 mg total) by mouth at bedtime.    Dispense:  90 tablet    Refill:  4   LamoTRIgine (LAMICTAL XR) 25 MG TB24 24 hour tablet    Sig: Take 2 tablets (50 mg total) by mouth daily.    Dispense:  60 tablet    Refill:  11   memantine (NAMENDA) 10 MG tablet    Sig: Take 1 tablet (10 mg total) by mouth 2 (two) times daily.    Dispense:  180 tablet    Refill:  3     HISTORY  Sheri Valencia is a 77 years old right-handed Caucasian female,followed  for complex partial seizure with secondary generalization. She has a history of complex partial seizure with secondary generalization  since age 45,  presented with rising sensation, then passed out followed by generalized motor seizure, last seizure was in 2002, she was treated with Lamictal 200 mg twice a day, continue to have recurrent seizure, Keppra 500 mg 2 tablets twice a day was added on, no recurrent seizure since. 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 Other levels alsodemonstrate significant stenosis.. She denies significant neck pain She has mild unsteady gait, always contributed to her left hip problem,  She was evaluated recently  by Dr. Channing Valencia, neurosurgeon, she follows up yearly   She returns for reevaluation and refills. She is on Aricept by PCP Dr. Sherryll Valencia.    UPDATE Jul 05 2017:  I was able to review the letter from her  Sister Sheri Valencia, she is accompanied by her friend Sheri Valencia at today's clinical visit,   "From her sister's letter: Sheri Valencia continue has memory problem, easily gets confused, gets frustrated easily, but deny  her problem, there was also description of paranoid, emotional incontinence, forgetful, poor decision-making process for her health, irrational spell, recent purchase of a new car, but difficult to operating her new vehicle, impulsive action, got lost while driving, difficulty working with her computer, loose piece constantly, difficulty managing her bills, ongoing since 2017, progressively getting worse"   Sheri Valencia drove her here today, she has lived [place] for 50 years, 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   She likes to walk, reading.  She lives in a codominum.    She has not have seizure for few years. 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   UPDATE October 26 2017: She was able to taper off Keppra 500 mg twice daily, only on lamotrigine 200 mg twice a day, doing better, along with Namenda 10 mg twice a day, her memory seems to improve will stabilize, care of her elderly aunt,   We personally reviewed MRI of the brain in May 2019, mild generalized atrophy no acute abnormality,   EEG was normal    Laboratory evaluation in May 2019 showed normal TSH, B12, RPR, CBC, CMP showed mild elevated 1.25, with GFR of 44, Keppra level was 66, lamotrigine level was 22, she has no signs of toxicity, the level was not trough level   Virtual Visit via Video on Jul 11, 2018   She is with her sister Sheri Valencia at visit.  She had no recurrent seizure, taking lamotrigine 200 mg twice a day, Keppra 500 mg twice a day, he complains of increased confusion, sometimes unsteady gait, fell few times, sister also concerned about worsening memory loss, had few incident of mis- handling her debit card,   Previously we have tried to taper off Keppra 500 mg 2 tablets twice a day, because her underweight, has not had recurrent seizure for many years, but she is concerned  about potential recurrent seizure, put herself back on Keppra 1 tablets twice a day,   Laboratory evaluations on Jul 05, 2017, normal TSH 1.9, lamotrigine 22.7, Keppra 66.5, normal B12, CBC hemoglobin of 11.1, creatinine of 1.25, potassium of 5.4   Update October 31, 2018: She is with her sister Sheri Valencia at today's visit, she continue to take lamotrigine 200 mg twice a day, has no recurrent seizure, she has worsening memory loss, excessive daytime sleepiness, fatigue, continue have depression, taking Zoloft 100 mg daily, she complains of change of taste, has decreased appetite, with some weight loss,   Laboratory evaluations on Jul 12, 2018 showed lamotrigine level was 69,   UPDATE Apr 05 2019: She is accompanied by her Sister Sheri Valencia at today's clinical visit, before Christmas, she complains of few days history of poor appetite, frequent diarrhea, on March 03, 2019, she woke up on the floor, has difficulty getting up from the floor, had a transient loss of consciousness," but felt like it is not a seizure", she called ambulance, was brought to the emergency room  at [facility], lamotrigine level was 30, I personally reviewed CT head without contrast showed no acute findings UA showed evidence of UTI, her symptoms improved with hydration,   However, she remained confused, weak, difficulty walking was admitted to [facility] on March 08, 2019, personally reviewed record, CT head showed no acute abnormality, CT of cervical spine showed multilevel degenerative changes, most severe at C5-6, C6 and 7,   She was treated for UTI, planning on to take Macrobid 100 mg daily for 1 month, there was no recurrent seizure activity, she is back on her home regimen of lamotrigine ER 200 mg 2 tablets every night   She does complains of low back pain, continue to feel generalized weakness, gait abnormality, "I have to think about how to walk my leg", also worsening urinary urgency, incontinence   Laboratory evaluation  showed mild anemia hemoglobin of 10.8 CMP showed mildly low potassium 3.4, decreased albumin 3.4   She now lives with her sister temporarily, planning on for short-term assisted living placement due to her continued gait abnormality, worsening confusion,   UPDATE May 14 2019: She is with her sister at today's clinical visit, she is currently lives at at [place], she had a significant improvement, no gait abnormality, no recurrent seizure, is planning on going home soon.   Acute onset of confusion in December 2020, is most likely related to her UTI, dehydration, polypharmacy treatment, depression anxiety, Lexapro 10 mg daily has been helpful.   We personally reviewed MRI of cervical spine on April 29, 2019: Multilevel degenerative changes, most severe at C5-6, with rightward disc protrusion, borderline mild spinal stenosis, no cord signal changes, severe right foraminal stenosis,   UPDATE Sept 13 2022: She had a breakthrough seizure on November 30, 2019, and May 02, 2020, both seizure happened while her sister was driving, she suddenly become required, forceful head turning to the right, neck hyperextension, body tonic-clonic movement,  She is now back on lamotrigine XR 250 mg every night, Keppra 500 mg twice a day was added on, she tolerated it well, there was no recurrent seizure   UPDATE August 19 2021: [facility] for vacation, she had a seizure on Jul 18, 2021, she finished taking a shower, stepping out, and loss of consciousness  Her sister heard a thump, but could not open the door, she was able to pick through the opening, patient had body jerking movement,  Paramedic was called, was treated at local hospital,   I reviewed her hospital record, blood pressure 173/77, temperature 98.1 pulse 100, laboratory evaluation showed mild elevated WBC 10.4, hemoglobin 11.4, creatinine 1, glucose 134 otherwise normal CMP, urine showed WBC 26-50, moderate epithelial  cells,  CT head showed no acute intracranial pathology, CT angiogram showed 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, otherwise patent neck vasculature, no intracranial large vessel occlusion  CT of cervical region showed no fracture,, multilevel level of degenerative disease, grade 1 anterolisthesis of C4 on 5,  EKG showed normal sinus rhythm  She was treated with azithromycin, ceftriaxone for UTI   Last antiepileptic level was in September 2022 with current dose, Keppra level was 39.8, lamotrigine level was 13.7 She complains of worsening depression anxiety, crying spells since seizure, was given Wellbutrin, she worried about the side effect of lowered seizure threshold, is not taking it anymore,  In addition she complains of worsening gait abnormality, urinary frequency urgency since recurrent seizure and fall in May 2023  Update 09/29/21 SS: Here  today with her sister, no recurrent seizure, on Lamictal XR 50 mg AM/250 mg PM, Onfi 2.5 mg at bedtime.  Does have fatigue, takes a nap, not convinced medication related.  Sister thinks she is doing great.  Denies significant neck pain, has urinary urgency, no falls, or gait changes.  EEG was normal 08/24/21, MRI of cervical spine showed multilevel degenerative changes, at C5-6 severe right foraminal narrowing and moderate left, potential for right C6 nerve root compression, also C7 nerve root compression potential.  IMPRESSION: This MRI of the cervical spine without contrast shows the following: 1.  At C3-C4, there is mild spinal stenosis and moderate bilateral foraminal narrowing but no nerve root compression. 2.  At C4-C5, there is minimal anterolisthesis and other degenerative changes causing moderate left foraminal narrowing but no spinal stenosis or nerve root compression. 3.  At C5-C6, there is a right paramedian disc protrusion and other degenerative change causing mild spinal stenosis and severe right foraminal narrowing and  moderate left foraminal narrowing.  There is potential for right C6 nerve root compression. 4.  At C6-C7, there are degenerative changes causing mild spinal stenosis and moderately severe right greater than left foraminal narrowing.  There is potential for C7 nerve root compression to either side. 5.  Degenerative changes are stable compared to the 04/29/2019 MRI.  Update January 19, 2022 SS: No seizures, living alone, her sister drove her, remains on Onfi 2.5 mg at bedtime, Lamictal XR 50 mg in AM, 250 mg PM. Denies any problems or concerns. Saw PCP earlier today with recurrent UTI, started antibiotic. 09/29/21 Lamictal level was 11.6.   Update November 01, 2022 SS: No seizures since May 2023, living alone, doing well. UTI few weeks ago, treated with antibiotics. Saw PCP for right sciatica, using lidocaine patches with good benefit, short distance driving, no issues. No neck pain. Has not fallen. Feels sleepy today.  MMSE 25/30.  Remains on Lamictal total XR 300 mg daily, Onfi 2.5 mg at bedtime.    REVIEW OF SYSTEMS: Out of a complete 14 system review of symptoms, the patient complains only of the following symptoms, and all other reviewed systems are negative.  See HPI  ALLERGIES: Allergies  Allergen Reactions   Ativan [Lorazepam]     confusion    HOME MEDICATIONS: Outpatient Medications Prior to Visit  Medication Sig Dispense Refill   alendronate (FOSAMAX) 70 MG tablet Take 70 mg by mouth every Friday.     aspirin EC 81 MG tablet Take 81 mg by mouth every 6 (six) hours as needed for moderate pain.     cloBAZam (ONFI) 2.5 MG/ML solution Take 1 mL (2.5 mg total) by mouth at bedtime. 120 mL 0   LamoTRIgine (LAMICTAL XR) 25 MG TB24 24 hour tablet Take 2 tablets (50 mg total) by mouth daily. 60 tablet 11   LamoTRIgine 250 MG TB24 24 hour tablet Take 250 mg by mouth at bedtime. 90 tablet 4   memantine (NAMENDA) 10 MG tablet Take 1 tablet (10 mg total) by mouth 2 (two) times daily. 180 tablet 3    rosuvastatin (CRESTOR) 5 MG tablet Take 10 mg by mouth daily.     sertraline (ZOLOFT) 100 MG tablet Take 100 mg by mouth daily.     No facility-administered medications prior to visit.    PAST MEDICAL HISTORY: Past Medical History:  Diagnosis Date   Depression    Memory loss    Seizure (HCC)    last seizure qwas over 1 year  ago.    PAST SURGICAL HISTORY: Past Surgical History:  Procedure Laterality Date   APPENDECTOMY     BIOPSY  08/16/2022   Procedure: BIOPSY;  Surgeon: Dolores Frame, MD;  [facility];  Service: Gastroenterology;;   COLONOSCOPY WITH PROPOFOL N/A 08/16/2022   Procedure: COLONOSCOPY WITH PROPOFOL;  Surgeon: Dolores Frame, MD;  [facility];  Service: Gastroenterology;  Laterality: N/A;  730am, asa 3   HEMOSTASIS CLIP PLACEMENT  08/16/2022   Procedure: HEMOSTASIS CLIP PLACEMENT;  Surgeon: Dolores Frame, MD;  [facility];  Service: Gastroenterology;;   POLYPECTOMY  08/16/2022   Procedure: POLYPECTOMY;  Surgeon: Dolores Frame, MD;  [facility];  Service: Gastroenterology;;   Susa Day  08/16/2022   Procedure: Susa Day;  Surgeon: Marguerita Merles, Reuel Boom, MD;  [facility];  Service: Gastroenterology;;   SUBMUCOSAL TATTOO INJECTION  08/16/2022   Procedure: SUBMUCOSAL TATTOO INJECTION;  Surgeon: Dolores Frame, MD;  [facility];  Service: Gastroenterology;;   TUBAL LIGATION      FAMILY HISTORY: Family History  Problem Relation Age of Onset   Pneumonia Mother    Heart attack Maternal Grandmother    Stroke Maternal Grandmother    Suicidality Brother    Aneurysm Maternal Aunt    Breast cancer Sister        04/2015  Stage I    SOCIAL HISTORY: Social History   Socioeconomic History   Marital status: Widowed    Spouse name: Not on file   Number of children: 0   Years of education: college   Highest education level: Not on  file  Occupational History    Employer: RETIRED    Comment: retired  Tobacco Use   Smoking status: Never    Passive exposure: Never   Smokeless tobacco: Never  Vaping Use   Vaping status: Never Used  Substance and Sexual Activity   Alcohol use: Yes    Alcohol/week: 1.0 standard drink of alcohol    Types: 1 Glasses of wine per week   Drug use: No   Sexual activity: Not Currently  Other Topics Concern   Not on file  Social History Narrative   Patient lives at home alone and she is widowed. Retired.   College education    Caffeine one cup daily.         Social Determinants of Health   Financial Resource Strain: Not on file  Food Insecurity: Not on file  Transportation Needs: Not on file  Physical Activity: Not on file  Stress: Not on file  Social Connections: Unknown (07/19/2021)   Received from [facility], [facility]   Social Network    Social Network: Not on file  Intimate Partner Violence: Unknown (06/10/2021)   Received from [facility], [facility]   HITS    Physically Hurt: Not on file    Insult or Talk Down To: Not on file    Threaten Physical Harm: Not on file    Scream or Curse: Not on file   PHYSICAL EXAM  Vitals:   11/01/22 1433 11/01/22 1440  BP: (!) 157/64 (!) 147/61  Pulse: 80 83  Weight: 100 lb 8 oz (45.6 kg)   Height: 5' (1.524 m)     Body mass index is 19.63 kg/m.    11/01/2022    2:25 PM 01/19/2022   10:00 AM 05/07/2020    9:21 AM  MMSE - Mini Mental State Exam  Orientation to  time 4 5 5   Orientation to Place 5 5 5   Registration 3 3 3   Attention/ Calculation 3 5 5   Recall 2 2 2   Language- name 2 objects 2 2 2   Language- repeat 1 1 1   Language- follow 3 step command 3 3 3   Language- read & follow direction 1 1 1   Write a sentence 1 1 1   Copy design 0 1 1  Total score 25 29 29    Generalized: Well developed, in no acute distress  Neurological examination  Mentation: Alert oriented to time, place, history taking. Follows all  commands speech and language fluent Cranial nerve II-XII: Pupils were equal round reactive to light. Extraocular movements were full, visual field were full on confrontational test. Facial sensation and strength were normal.  Head turning and shoulder shrug  were normal and symmetric. Motor: The motor testing reveals 5 over 5 strength of all 4 extremities. Good symmetric motor tone is noted throughout.  Sensory: Sensory testing is intact to soft touch on all 4 extremities. No evidence of extinction is noted.  Coordination: Cerebellar testing reveals good finger-nose-finger and heel-to-shin bilaterally.  Gait and station: Gait is normal.  Slightly wide-based. Reflexes: Deep tendon reflexes are symmetric increased at the knees  DIAGNOSTIC DATA (LABS, IMAGING, TESTING) - I reviewed patient records, labs, notes, testing and imaging myself where available.  Lab Results  Component Value Date   WBC 9.1 12/04/2019   HGB 11.4 12/04/2019   HCT 34.5 12/04/2019   MCV 87 12/04/2019   PLT 307 12/04/2019      Component Value Date/Time   NA 137 12/04/2019 0849   K 5.3 (H) 12/04/2019 0849   CL 101 12/04/2019 0849   CO2 24 12/04/2019 0849   GLUCOSE 94 12/04/2019 0849   GLUCOSE 122 (H) 03/03/2019 0916   BUN 12 12/04/2019 0849   CREATININE 1.05 (H) 12/04/2019 0849   CALCIUM 9.6 12/04/2019 0849   PROT 7.1 12/04/2019 0849   ALBUMIN 4.3 12/04/2019 0849   AST 33 12/04/2019 0849   ALT 23 12/04/2019 0849   ALKPHOS 123 (H) 12/04/2019 0849   BILITOT 0.2 12/04/2019 0849   GFRNONAA 53 (L) 12/04/2019 0849   GFRAA 61 12/04/2019 0849   No results found for: "CHOL", "HDL", "LDLCALC", "LDLDIRECT", "TRIG", "CHOLHDL" No results found for: "HGBA1C" Lab Results  Component Value Date   VITAMINB12 865 07/05/2017   Lab Results  Component Value Date   TSH 3.360 11/17/2020    Margie Ege, AGNP-C, DNP 11/01/2022, 2:51 PM [facility] [address] [place], [place] [address] [phone]

## 2022-11-01 ENCOUNTER — Encounter: Payer: Self-pay | Admitting: Neurology

## 2022-11-01 ENCOUNTER — Ambulatory Visit: Payer: PPO | Admitting: Neurology

## 2022-11-01 VITALS — BP 147/61 | HR 83 | Ht 60.0 in | Wt 100.5 lb

## 2022-11-01 DIAGNOSIS — R569 Unspecified convulsions: Secondary | ICD-10-CM

## 2022-11-01 DIAGNOSIS — G3184 Mild cognitive impairment, so stated: Secondary | ICD-10-CM | POA: Diagnosis not present

## 2022-11-01 MED ORDER — MEMANTINE HCL 10 MG PO TABS: 10.0000 mg | ORAL_TABLET | Freq: Two times a day (BID) | ORAL | 3 refills | Status: DC

## 2022-11-01 MED ORDER — LAMOTRIGINE ER 250 MG PO TB24: 250.0000 mg | ORAL_TABLET | Freq: Every day | ORAL | 4 refills | Status: DC

## 2022-11-01 MED ORDER — LAMOTRIGINE ER 25 MG PO TB24: 50.0000 mg | ORAL_TABLET | Freq: Every day | ORAL | 11 refills | Status: DC

## 2022-11-01 MED ORDER — CLOBAZAM 2.5 MG/ML PO SUSP: 2.5000 mg | Freq: Every day | ORAL | 1 refills | Status: DC

## 2022-11-01 NOTE — Patient Instructions (Signed)
Continue current medications.  Check Lamictal level today.  Please call for any seizures.  See back in 1 year.  Thanks!!  Meds ordered this encounter  Medications   cloBAZam (ONFI) 2.5 MG/ML solution    Sig: Take 1 mL (2.5 mg total) by mouth at bedtime.    Dispense:  90 mL    Refill:  1   LamoTRIgine 250 MG TB24 24 hour tablet    Sig: Take 1 tablet (250 mg total) by mouth at bedtime.    Dispense:  90 tablet    Refill:  4   LamoTRIgine (LAMICTAL XR) 25 MG TB24 24 hour tablet    Sig: Take 2 tablets (50 mg total) by mouth daily.    Dispense:  60 tablet    Refill:  11   memantine (NAMENDA) 10 MG tablet    Sig: Take 1 tablet (10 mg total) by mouth 2 (two) times daily.    Dispense:  180 tablet    Refill:  3

## 2022-11-02 LAB — LAMOTRIGINE LEVEL: Lamotrigine Lvl: 16.9 ug/mL (ref 2.0–20.0)

## 2022-11-04 DIAGNOSIS — N270 Small kidney, unilateral: Secondary | ICD-10-CM | POA: Diagnosis not present

## 2022-11-04 DIAGNOSIS — N1831 Chronic kidney disease, stage 3a: Secondary | ICD-10-CM | POA: Diagnosis not present

## 2022-11-04 DIAGNOSIS — D638 Anemia in other chronic diseases classified elsewhere: Secondary | ICD-10-CM | POA: Diagnosis not present

## 2022-11-04 DIAGNOSIS — I129 Hypertensive chronic kidney disease with stage 1 through stage 4 chronic kidney disease, or unspecified chronic kidney disease: Secondary | ICD-10-CM | POA: Diagnosis not present

## 2022-11-11 DIAGNOSIS — E440 Moderate protein-calorie malnutrition: Secondary | ICD-10-CM | POA: Diagnosis not present

## 2022-11-11 DIAGNOSIS — Z299 Encounter for prophylactic measures, unspecified: Secondary | ICD-10-CM | POA: Diagnosis not present

## 2022-11-11 DIAGNOSIS — R519 Headache, unspecified: Secondary | ICD-10-CM | POA: Diagnosis not present

## 2022-11-11 DIAGNOSIS — J069 Acute upper respiratory infection, unspecified: Secondary | ICD-10-CM | POA: Diagnosis not present

## 2022-12-14 ENCOUNTER — Other Ambulatory Visit: Payer: Self-pay | Admitting: Neurology

## 2022-12-15 ENCOUNTER — Telehealth: Payer: Self-pay | Admitting: Neurology

## 2022-12-15 NOTE — Telephone Encounter (Signed)
Called and spoke to [place] at the pt's pharmacy and she got one refill for thirty days on 10.02.2024 and has two refills on file. She states she only had it filled once. So no refills are needed at this time.

## 2022-12-15 NOTE — Telephone Encounter (Signed)
Looks like she should have 2 refills left of clobazam. Was it filled twice on 12/07/22?

## 2022-12-30 DIAGNOSIS — N183 Chronic kidney disease, stage 3 unspecified: Secondary | ICD-10-CM | POA: Diagnosis not present

## 2022-12-30 DIAGNOSIS — N189 Chronic kidney disease, unspecified: Secondary | ICD-10-CM | POA: Diagnosis not present

## 2022-12-30 DIAGNOSIS — M6380 Disorders of muscle in diseases classified elsewhere, unspecified site: Secondary | ICD-10-CM | POA: Diagnosis not present

## 2022-12-30 DIAGNOSIS — D631 Anemia in chronic kidney disease: Secondary | ICD-10-CM | POA: Diagnosis not present

## 2022-12-30 DIAGNOSIS — R809 Proteinuria, unspecified: Secondary | ICD-10-CM | POA: Diagnosis not present

## 2022-12-30 DIAGNOSIS — D519 Vitamin B12 deficiency anemia, unspecified: Secondary | ICD-10-CM | POA: Diagnosis not present

## 2023-01-02 DIAGNOSIS — I129 Hypertensive chronic kidney disease with stage 1 through stage 4 chronic kidney disease, or unspecified chronic kidney disease: Secondary | ICD-10-CM | POA: Diagnosis not present

## 2023-01-02 DIAGNOSIS — N270 Small kidney, unilateral: Secondary | ICD-10-CM | POA: Diagnosis not present

## 2023-01-02 DIAGNOSIS — R319 Hematuria, unspecified: Secondary | ICD-10-CM | POA: Diagnosis not present

## 2023-01-02 DIAGNOSIS — N1831 Chronic kidney disease, stage 3a: Secondary | ICD-10-CM | POA: Diagnosis not present

## 2023-01-16 DIAGNOSIS — N189 Chronic kidney disease, unspecified: Secondary | ICD-10-CM | POA: Diagnosis not present

## 2023-01-16 DIAGNOSIS — N270 Small kidney, unilateral: Secondary | ICD-10-CM | POA: Diagnosis not present

## 2023-01-16 DIAGNOSIS — D638 Anemia in other chronic diseases classified elsewhere: Secondary | ICD-10-CM | POA: Diagnosis not present

## 2023-01-16 DIAGNOSIS — R319 Hematuria, unspecified: Secondary | ICD-10-CM | POA: Diagnosis not present

## 2023-01-16 DIAGNOSIS — I129 Hypertensive chronic kidney disease with stage 1 through stage 4 chronic kidney disease, or unspecified chronic kidney disease: Secondary | ICD-10-CM | POA: Diagnosis not present

## 2023-01-16 DIAGNOSIS — N1831 Chronic kidney disease, stage 3a: Secondary | ICD-10-CM | POA: Diagnosis not present

## 2023-01-30 ENCOUNTER — Ambulatory Visit: Payer: PPO | Admitting: Urology

## 2023-02-01 ENCOUNTER — Other Ambulatory Visit: Payer: Self-pay | Admitting: Neurology

## 2023-02-03 ENCOUNTER — Other Ambulatory Visit: Payer: Self-pay

## 2023-02-06 ENCOUNTER — Other Ambulatory Visit: Payer: Self-pay | Admitting: Neurology

## 2023-02-10 NOTE — Progress Notes (Unsigned)
Name: Sheri Valencia DOB: 27-Oct-1945 MRN: [id number]  History of Present Illness: Sheri Valencia is a 77 y.o. female who presents today as a new patient at [facility] [place]. All available relevant medical records have been reviewed. She is accompanied by her sister [name], who assists with providing history due to patient's Alzheimer's dementia. - GU history includes: 1. Recurrent UTIs. - No urine culture results in past 12 months found per chart review (including scanned records from referring provider). 2. Urinary incontinence (unspecified). 3. CKD with proteinuria. 4. Atrophic left kidney. - CT abdomen/pelvis with contrast on 10/14/2022 showed: "Marked left renal parenchymal atrophy with compensatory hypertrophy of the right kidney."  Today: Reports being treated for at least 2 UTls in past 12 months. She denies ever having any UTI symptoms with those. Her sister does report that Sheri Valencia had a UTI about 2 years ago with correlating symptoms including acute mental status change (confusion), weakness, altered behavior.   She denies UTI symptoms today. She denies dysuria, gross hematuria, hesitancy, straining to void, or sensations of incomplete emptying.   At baseline she reports urinary frequency, nocturia x1, urgency, and nocturnal enuresis. Denies daytime urinary incontinence. Wears pads overnight. She denies stress incontinence with cough/laugh/sneeze. She reports urinary incontinence is not significantly bothersome.   Reports recurrent microscopic hematuria. Denies ever seeing gross hematuria.  She reports vaginal bulge sensation which she sometimes "pushes up" when urinating. She denies bother related to the vaginal bulge. She denies vaginal pain, bleeding, discharge, or itching.  She denies history of kidney stones. She denies history of pyelonephritis. She denies flank pain or abdominal pain.   Fall Screening: Do you usually have a device to assist in your mobility? No    Medications: Current Outpatient Medications  Medication Sig Dispense Refill   alendronate (FOSAMAX) 70 MG tablet Take 70 mg by mouth every Friday.     aspirin EC 81 MG tablet Take 81 mg by mouth every 6 (six) hours as needed for moderate pain.     cloBAZam (ONFI) 2.5 MG/ML solution Take 1 mL (2.5 mg total) by mouth at bedtime. 90 mL 1   estradiol (ESTRACE) 0.1 MG/GM vaginal cream Place 1 Applicatorful vaginally at bedtime. 42.5 g 12   LamoTRIgine (LAMICTAL XR) 25 MG TB24 24 hour tablet Take 2 tablets (50 mg total) by mouth daily. 60 tablet 11   LamoTRIgine 250 MG TB24 24 hour tablet Take 1 tablet (250 mg total) by mouth at bedtime. 90 tablet 4   memantine (NAMENDA) 10 MG tablet TAKE 1 TABLET BY MOUTH TWICE DAILY. 60 tablet 0   rosuvastatin (CRESTOR) 5 MG tablet Take 10 mg by mouth daily.     sertraline (ZOLOFT) 100 MG tablet Take 100 mg by mouth daily.     No current facility-administered medications for this visit.    Allergies: Allergies  Allergen Reactions   Ativan [Lorazepam]     confusion    Past Medical History:  Diagnosis Date   Depression    Memory loss    Seizure (HCC)    last seizure qwas over 1 year ago.   Past Surgical History:  Procedure Laterality Date   APPENDECTOMY     BIOPSY  08/16/2022   Procedure: BIOPSY;  Surgeon: Dolores Frame, MD;  [facility]: [facility];  Service: Gastroenterology;;   COLONOSCOPY WITH PROPOFOL N/A 08/16/2022   Procedure: COLONOSCOPY WITH PROPOFOL;  Surgeon: Dolores Frame, MD;  [facility];  Service: Gastroenterology;  Laterality:  N/A;  730am, asa 3   HEMOSTASIS CLIP PLACEMENT  08/16/2022   Procedure: HEMOSTASIS CLIP PLACEMENT;  Surgeon: Dolores Frame, MD;  [facility];  Service: Gastroenterology;;   POLYPECTOMY  08/16/2022   Procedure: POLYPECTOMY;  Surgeon: Dolores Frame, MD;  [facility];  Service: Gastroenterology;;   Susa Day  08/16/2022    Procedure: Susa Day;  Surgeon: Marguerita Merles, Reuel Boom, MD;  [facility];  Service: Gastroenterology;;   SUBMUCOSAL TATTOO INJECTION  08/16/2022   Procedure: SUBMUCOSAL TATTOO INJECTION;  Surgeon: Dolores Frame, MD;  [facility];  Service: Gastroenterology;;   TUBAL LIGATION     Family History  Problem Relation Age of Onset   Pneumonia Mother    Heart attack Maternal Grandmother    Stroke Maternal Grandmother    Suicidality Brother    Aneurysm Maternal Aunt    Breast cancer Sister        04/2015  Stage I   Social History   Socioeconomic History   Marital status: Widowed    Spouse name: Not on file   Number of children: 0   Years of education: college   Highest education level: Not on file  Occupational History    Employer: RETIRED    Comment: retired  Tobacco Use   Smoking status: Never    Passive exposure: Never   Smokeless tobacco: Never  Vaping Use   Vaping status: Never Used  Substance and Sexual Activity   Alcohol use: Yes    Alcohol/week: 1.0 standard drink of alcohol    Types: 1 Glasses of wine per week   Drug use: No   Sexual activity: Not Currently  Other Topics Concern   Not on file  Social History Narrative   Patient lives at home alone and she is widowed. Retired.   College education    Caffeine one cup daily.         Social Determinants of Health   Financial Resource Strain: Not on file  Food Insecurity: Not on file  Transportation Needs: Not on file  Physical Activity: Not on file  Stress: Not on file  Social Connections: Unknown (07/19/2021)   Received from [facility]   Social Network    Social Network: Not on file  Intimate Partner Violence: Unknown (06/10/2021)   Received from [facility], [facility]   HITS    Physically Hurt: Not on file    Insult or Talk Down To: Not on file    Threaten Physical Harm: Not on file    Scream or Curse: Not on file    SUBJECTIVE  Review of  Systems Constitutional: Patient denies any unintentional weight loss or change in strength lntegumentary: Patient denies any rashes or pruritus Cardiovascular: Patient denies chest pain or syncope Respiratory: Patient denies shortness of breath Gastrointestinal: Patient denies nausea, vomiting, constipation, or diarrhea Musculoskeletal: Patient denies muscle cramps or weakness Neurologic: Patient denies convulsions or seizures Allergic/Immunologic: Patient denies recent allergic reaction(s) Hematologic/Lymphatic: Patient denies bleeding tendencies Endocrine: Patient denies heat/cold intolerance  GU: As per HPI.  OBJECTIVE Vitals:   02/14/23 0845  BP: (!) 174/74  Pulse: 75  Temp: 98.1 F (36.7 C)   There is no height or weight on file to calculate BMI.  Physical Examination Constitutional: No obvious distress; patient is non-toxic appearing  Cardiovascular: No visible lower extremity edema.  Respiratory: The patient does not have audible wheezing/stridor; respirations do not appear labored  Gastrointestinal: Abdomen non-distended Musculoskeletal: Normal  ROM of UEs  Skin: No obvious rashes/open sores  Neurologic: CN 2-12 grossly intact Psychiatric: Answered questions appropriately with normal affect  Hematologic/Lymphatic/Immunologic: No obvious bruises or sites of spontaneous bleeding  UA: 6-10 WBC/hpf, 11-30 RBC/hpf, no bacteria PVR: 64 ml  ASSESSMENT History of recurrent UTI (urinary tract infection) - Plan: Urinalysis, Routine w reflex microscopic, BLADDER SCAN AMB NON-IMAGING, estradiol (ESTRACE) 0.1 MG/GM vaginal cream  Urinary incontinence, unspecified type - Plan: Urinalysis, Routine w reflex microscopic, BLADDER SCAN AMB NON-IMAGING, estradiol (ESTRACE) 0.1 MG/GM vaginal cream  Alzheimer's dementia, unspecified dementia severity, unspecified timing of dementia onset, unspecified whether behavioral, psychotic, or mood disturbance or anxiety (HCC) - Plan: Urinalysis,  Routine w reflex microscopic, BLADDER SCAN AMB NON-IMAGING  Atrophic vaginitis - Plan: estradiol (ESTRACE) 0.1 MG/GM vaginal cream  Microscopic hematuria - Plan: estradiol (ESTRACE) 0.1 MG/GM vaginal cream  Female genital prolapse, unspecified type - Plan: estradiol (ESTRACE) 0.1 MG/GM vaginal cream  History of recurrent UTIs (per referring provider) with insufficient urine culture data: We reviewed the diagnostic criteria for recurrent UTI and that we do not currently have adequate urine culture data to support or refute the formal diagnosis of recurrent UTI. Question if she was actually having asymptomatic bacteriuria when treated with antibiotics for "UTI" in the past year; will request any additional outside urine cultures for review.  UA today appears abnormal however patient is asymptomatic for UTI, therefore acute antibiotic treatment is not advised at this time. The rationale for this was discussed with the patient.  - We discussed the difference between asymptomatic bacteriuria versus symptoms suggestive of a possible UTI which may include: fever, confusion, malaise, increased fatigue, dysuria, acute increase in urinary frequency / urgency / urge incontinence.  - We discussed that urine color, clarity, and odor are not considered to be clinical indicators of UTI and do not warrant urine testing in the absence of other acute UTI symptoms.  - In the absence of active UTI symptoms, a positive urine culture is considered to represent colonization of the urinary tract which does not warrant acute antibiotic treatment. Urine testing / cultures are not advised in the absence of acute UTI symptoms given that acute antibiotic treatment would not be indicated regardless of the findings. Patient was advised that positive urine cultures only warrant acute antibiotic treatment if UTI symptoms are present.  - We discussed antibiotic stewardship and goal to minimize risk for developing antibiotic  resistance. - We discussed the difference between urinalysis (urine dipstick test) vs. urine culture and why urine cultures are needed to determine appropriate diagnosis and treatment of urologic symptoms. - We discussed that in some cases there are non-infectious etiologies for UTI-like symptoms. In those situations antibiotic use is considered to be inappropriate, unhelpful, and contributory to increased patient risk for the development of antibiotic-resistant pathogens.  For UTI prevention we discussed the following options, for which detailed information has been included in Patient Information section of today's After Visit Summary. > Maintain adequate fluid intake daily to flush out the urinary tract. > Go to the bathroom to urinate every 4-6 hours while awake to minimize urinary stasis / bacterial overgrowth in the bladder. > Proanthocyanidin (PAC) supplement 36 mg daily; must be soluble (insoluble form of PAC will be ineffective). Recommend Ellura.  > D-mannose 2 g daily to minimize bacterial adherence in urinary tract > Vitamin C supplement to acidify urine to suppress bacterial growth > Probiotic to maintain healthy vaginal microbiome > Topical vaginal estrogen for vaginal atrophy.  - The  etiology and consequences of urogenital epithelial atrophy was explained to patient.  - The normal bacterial flora that colonizes the perineum may contribute to UTI risk because the thin urethral epithelium allows the bacteria to become adherent and the change in vaginal pH can disrupt the vaginal / urethral microbiome and allow for bacterial overgrowth.  - Patient was advised that topical vaginal estrogen replacement will take about 3 months to restore the vaginal pH and may sting/burn initially due to severe dryness, which will improve with ongoing treatment.  - We discussed that there have been studies that evaluate use of low-dose intravaginal estrogen cream that shows minimal systemic absorption that  is negligible after 3 weeks. There have been no studies indicating increased risk of contributing to breast cancer development or recurrence.  2. Nocturnal enuresis / urine leakage without sensory awareness overnight: Not significantly bothersome per patient.  3. Pelvic organ prolapse: Not significantly bothersome per patient. No evidence of significant incomplete bladder emptying. We discussed the pathophysiology of this condition briefly and option to consider pessary fitting. She elected to proceed with expectant management.   4. Microscopic hematuria. We discussed that this is suspected to be attributable to vaginal atrophy, particularly given her age and negative CT in August 2024.   We agreed to start topical vaginal estrogen cream nightly for suspected vaginal atrophy which is anticipated to resolve her microscopic hematuria, reduce her UTI risk, and maintain vaginal tissue integrity re: POP.   Will plan for follow up in 3 months or sooner if needed. Pt verbalized understanding and agreement. All questions were answered.  PLAN Advised the following: Requesting outside urine culture results. Start use of topical vaginal estrogen cream every night at bedtime. Return in about 3 months (around 05/15/2023) for UA, PVR, & f/u with Evette Georges NP.  Orders Placed This Encounter  Procedures   Urinalysis, Routine w reflex microscopic   BLADDER SCAN AMB NON-IMAGING    It has been explained that the patient is to follow regularly with their PCP in addition to all other providers involved in their care and to follow instructions provided by these respective offices. Patient advised to contact urology clinic if any urologic-pertaining questions, concerns, new symptoms or problems arise in the interim period.  Patient Instructions  Recommendations regarding UTI prevention / management:  Options when UTI symptoms occur: 1. Call [facility] [place] to request urgent / same-day visit  (phone # [phone]).  2. Call your Primary Care Provider (PCP) office to request urgent / same-day visit. Be sure to request for urine culture to be ordered and have results faxed to Urology (fax # [phone]).  3. Go to urgent care. Be sure to request for urine culture to be ordered and have results faxed to Urology (fax # [phone]).   For bladder pain/ burning with urination: - Can take OTC Pyridium (phenazopyridine; commonly known under the "AZO" brand) for a few days as needed. Limit use to no more than 3 days consecutively due to risk for methemoglobinemia, liver function issues, and bone health damage with long term use of Pyridium.  Routine use for UTI prevention: - Topical vaginal estrogen for vaginal atrophy. - Adequate daily fluid intake to flush out the urinary tract. - Go to the bathroom to urinate every 4-6 hours while awake to minimize urinary stasis / bacterial overgrowth in the bladder. - Proanthocyanidin (PAC) supplement 36 mg daily; must be soluble (insoluble form of PAC will be ineffective). Recommended brand: Ellura. This is an over-the-counter supplement (  often must be found/ purchased online) supplement derived from cranberries with concentrated active component: Proanthocyanidin (PAC) 36 mg daily. Decreases bacterial adherence to bladder lining.  - D-mannose powder (2 grams daily). This is an over-the-counter supplement which decreases bacterial adherence to bladder lining (it is a sugar that inhibits bacterial adherence to urothelial cells by binding to the pili of enteric bacteria). Take as per manufacturer recommendation. Can be used as an alternative or in addition to the concentrated cranberry supplement.  - Vitamin C supplement to acidify urine to minimize bacterial growth.  - Probiotic to maintain healthy vaginal microbiome to suppress bacteria at urethral opening. Brand recommendations: Darrold Junker (includes probiotic & D-mannose ), Feminine Balance (highest  concentration of lactobacillus) or Hyperbiotic Pro 15.  Note for patients with diabetes:  - Be aware that D-mannose contains sugar.  Note for patients with interstitial cystitis (IC):  - Patients with IC should typically avoid cranberry/ PAC supplements and Vitamin C supplements due to their acidity, which may exacerbate IC-related bladder pain. - Symptoms of true bacterial UTI can overlap / mimic symptoms of an IC flare up. Antibiotic use is NOT indicated for IC flare ups. Urine culture needed prior to antibiotic treatment for IC patients. The goal is to minimize your risk for developing antibiotic-resistant bacteria.    Vulvovaginal atrophy I Genitourinary syndrome of menopause (GSM):  What it is: Changes in the vaginal environment (including the vulva and urethra) including: Thinning of the epithelium (skin/ mucosa surface) Can contribute to urinary urgency and frequency Can contribute to dryness, itching, irritation of the vulvar and vaginal tissue Can contribute to pain with intercourse Can contribute to physical changes of the labia, vulva, and vagina such as: Narrowing of the vaginal opening Decreased vaginal length Loss of labial architecture Labial adhesions Pale color of vulvovaginal tissue  Loss of pubic hair Allows bacteria to become adherent  Results in increased risk for urinary tract infection (UTI) due to bacterial overgrowth and migration up the urethra into the bladder Change in vaginal pH (acid/ base balance) Allows for alteration / disruption of the normal bacterial flora / microbiome Results in increased risk for urinary tract infection (UTI) due to bacterial overgrowth  Treatment options: Over-the-counter lubricants (see list below). Prescription vaginal estrogen replacement. Options: Topical vaginal estrogen cream Estrace, Premarin, or compounded estradiol cream/ gel We advise: Discard plastic applicator as that tends to use more medication than you  need, which is not harmful but wastes / uses up the medication. Also the plastic applicator may cause discomfort. Insert blueberry size amount of medication via the tip of your finger inside vagina nightly for 1 week then 2-3 times per week (long term). Estring vaginal ring Exchanged every 3 months (either at home or in office by provider) Vagifem vaginal tablet Inserted nightly for 2 weeks then twice a week (long term) lntrarosa vaginal suppository Vaginal DHEA: converts to estrogen in vaginal tissue without systemic effect Inserted nightly (long term) Vaginal laser therapy (Mona Lisa touch) Performed in 3 treatments each 6 weeks apart (available in our [place] office). Can feel like a sunburn for 3-4 days after each treatment until new skin heals in. Usually not covered by insurance. Estimated cost is $1500 for all 3 sessions.  FYI regarding prescription vaginal estrogen treatment options: All topical vaginal estrogen replacement options are equivalent in terms of efficacy. Topical vaginal estrogen replacement will take about 3 months to be effective. OK to have sex with any of the topical vaginal estrogen replacement options. Topical vaginal estrogen  replacement may sting/burn initially due to severe dryness, which will improve with ongoing treatment. There have been studies that evaluate use of low-dose intravaginal estrogen that show minimal systemic absorption which is negligible after 3 weeks. There have been no studies indicating increased risk of contributing to cancer development or recurrence.  Topical vaginal estrogen cream safe to use with breast cancer history [email or web address]  Topical vaginal estrogen cream safe to use with blood clot  history [email or web address]   Lubricants and Moisturizers for Treating Genitourinary Syndrome of Menopause and Vulvovaginal Atrophy Treatment Comments I Available Products   Lubricants   Water-based Ingredients: Deionized water, glycerin, propylene glycol; latex safe; rare irritation; dry out with extended sexual activity Astroglide, Good Clean Love, K-Y Jelly, Natural, Organic, Pink, Sliquid, Sylk, Yes    Oil Based Ingredients: avocado, olive, peanut, corn; latex safe; can be used with silicone products; staining; safe (unless peanut allergy); non-irritating Coconut oil, vegetable oil, vitamin E oil  Silicone-Based Ingredients: Silicone polymers; staining; typically nonirritating, long lasting; waterproof; should not be used with silicone dilators, sexual toys, or gynecologic products Astroglide X, Oceanus Ultra Pure, Pink Silicone, Pjur Eros, Replens Silky Smooth, Silicone Premium JO, SKYN, Uberlube, [employer] Based Minimize harm to sperm motility; designed Astroglide TTC, Conceive Plus, Pre for couples trying to conceive Seed, Yes Baby  Fertility Friendly Minimize harm to sperm motility; designed Astroglide, TTC, Conceive Plus, Pre for couples trying to conceive Seed, Yes Baby  Vaginal Moisturizers   Vaginal Moisturizers For maintenance use 1 to 3 times weekly; can benefit women with dryness, chafing with AOL, and recurrent vaginal infections irrespective of sexual activity timing Balance Active Menopause Vaginal Moisturizing Lubricant, Canesintima Intimate Moisturizer, Replens, Rephresh, Sylk Natural Intimate Moisturizer, Yes Vaginal Moisturizer  Hybrids Properties of both water and silicone-based products (combination of a vaginal lubricant and moisturizer); Non-irritating; good option for women with allergies and  sensitivities Lubrigyn, Luvena  Suppositories Hyaluronic acid to retain moisture Revaree  Vulvar Soothing Creams/Oils    Medicated CreamsP ain and burn relief; Ingredients: 4% Lidocaine, Aloe Vera gel Releveum ([employer])  Non-Medicated Creams For anti-itch and moisture/maintenance; Ingredients: Coconut oil, Avocado oil, Shea Butter, Olive oil, Vitamin E Vajuvenate, Vmagic  Oils !For moisture/maintenance !Coconut oil, Vitamin E oil, Emu oil     Electronically signed by:  Donnita Falls, MSN, FNP-C, CUNP 02/14/2023 9:57 AM

## 2023-02-14 ENCOUNTER — Encounter: Payer: Self-pay | Admitting: Urology

## 2023-02-14 ENCOUNTER — Ambulatory Visit: Payer: PPO | Admitting: Urology

## 2023-02-14 VITALS — BP 174/74 | HR 75 | Temp 98.1°F

## 2023-02-14 DIAGNOSIS — R32 Unspecified urinary incontinence: Secondary | ICD-10-CM

## 2023-02-14 DIAGNOSIS — N819 Female genital prolapse, unspecified: Secondary | ICD-10-CM

## 2023-02-14 DIAGNOSIS — F0280 Dementia in other diseases classified elsewhere without behavioral disturbance: Secondary | ICD-10-CM

## 2023-02-14 DIAGNOSIS — R3129 Other microscopic hematuria: Secondary | ICD-10-CM | POA: Diagnosis not present

## 2023-02-14 DIAGNOSIS — N952 Postmenopausal atrophic vaginitis: Secondary | ICD-10-CM

## 2023-02-14 DIAGNOSIS — Z87440 Personal history of urinary (tract) infections: Secondary | ICD-10-CM

## 2023-02-14 DIAGNOSIS — G309 Alzheimer's disease, unspecified: Secondary | ICD-10-CM | POA: Diagnosis not present

## 2023-02-14 LAB — URINALYSIS, ROUTINE W REFLEX MICROSCOPIC
Bilirubin, UA: NEGATIVE
Glucose, UA: NEGATIVE
Ketones, UA: NEGATIVE
Nitrite, UA: NEGATIVE
Protein,UA: NEGATIVE
Specific Gravity, UA: 1.02 (ref 1.005–1.030)
Urobilinogen, Ur: 0.2 mg/dL (ref 0.2–1.0)
pH, UA: 6 (ref 5.0–7.5)

## 2023-02-14 LAB — MICROSCOPIC EXAMINATION: Bacteria, UA: NONE SEEN

## 2023-02-14 LAB — BLADDER SCAN AMB NON-IMAGING: Scan Result: 64

## 2023-02-14 MED ORDER — ESTRADIOL 0.1 MG/GM VA CREA: 1.0000 | TOPICAL_CREAM | Freq: Every day | VAGINAL | 12 refills | Status: DC

## 2023-02-14 NOTE — Patient Instructions (Signed)
 Recommendations regarding UTI prevention / management:  Options when UTI symptoms occur: 1. Call [facility] to request urgent / same-day visit (phone # [phone]).  2. Call your Primary Care Provider (PCP) office to request urgent / same-day visit. Be sure to request for urine culture to be ordered and have results faxed to Urology (fax # [phone]).  3. Go to urgent care. Be sure to request for urine culture to be ordered and have results faxed to Urology (fax # [phone]).   For bladder pain/ burning with urination: - Can take OTC Pyridium (phenazopyridine; commonly known under the "AZO" brand) for a few days as needed. Limit use to no more than 3 days consecutively due to risk for methemoglobinemia, liver function issues, and bone health damage with long term use of Pyridium.  Routine use for UTI prevention: - Topical vaginal estrogen for vaginal atrophy. - Adequate daily fluid intake to flush out the urinary tract. - Go to the bathroom to urinate every 4-6 hours while awake to minimize urinary stasis / bacterial overgrowth in the bladder. - Proanthocyanidin (PAC) supplement 36 mg daily; must be soluble (insoluble form of PAC will be ineffective). Recommended brand: Ellura. This is an over-the-counter supplement (often must be found/ purchased online) supplement derived from cranberries with concentrated active component: Proanthocyanidin (PAC) 36 mg daily. Decreases bacterial adherence to bladder lining.  - D-mannose powder (2 grams daily). This is an over-the-counter supplement which decreases bacterial adherence to bladder lining (it is a sugar that inhibits bacterial adherence to urothelial cells by binding to the pili of enteric bacteria). Take as per manufacturer recommendation. Can be used as an alternative or in addition to the concentrated cranberry supplement.  - Vitamin C supplement to acidify urine to minimize bacterial growth.  - Probiotic to maintain  healthy vaginal microbiome to suppress bacteria at urethral opening. Brand recommendations: Darrold Junker (includes probiotic & D-mannose ), Feminine Balance (highest concentration of lactobacillus) or Hyperbiotic Pro 15.  Note for patients with diabetes:  - Be aware that D-mannose contains sugar.  Note for patients with interstitial cystitis (IC):  - Patients with IC should typically avoid cranberry/ PAC supplements and Vitamin C supplements due to their acidity, which may exacerbate IC-related bladder pain. - Symptoms of true bacterial UTI can overlap / mimic symptoms of an IC flare up. Antibiotic use is NOT indicated for IC flare ups. Urine culture needed prior to antibiotic treatment for IC patients. The goal is to minimize your risk for developing antibiotic-resistant bacteria.    Vulvovaginal atrophy I Genitourinary syndrome of menopause (GSM):  What it is: Changes in the vaginal environment (including the vulva and urethra) including: Thinning of the epithelium (skin/ mucosa surface) Can contribute to urinary urgency and frequency Can contribute to dryness, itching, irritation of the vulvar and vaginal tissue Can contribute to pain with intercourse Can contribute to physical changes of the labia, vulva, and vagina such as: Narrowing of the vaginal opening Decreased vaginal length Loss of labial architecture Labial adhesions Pale color of vulvovaginal tissue Loss of pubic hair Allows bacteria to become adherent  Results in increased risk for urinary tract infection (UTI) due to bacterial overgrowth and migration up the urethra into the bladder Change in vaginal pH (acid/ base balance) Allows for alteration / disruption of the normal bacterial flora / microbiome Results in increased risk for urinary tract infection (UTI) due to bacterial overgrowth  Treatment options: Over-the-counter lubricants (see list below). Prescription vaginal estrogen replacement. Options: Topical vaginal  estrogen  cream Estrace, Premarin, or compounded estradiol cream/ gel We advise: Discard plastic applicator as that tends to use more medication than you need, which is not harmful but wastes / uses up the medication. Also the plastic applicator may cause discomfort. Insert blueberry size amount of medication via the tip of your finger inside vagina nightly for 1 week then 2-3 times per week (long term). Estring vaginal ring Exchanged every 3 months (either at home or in office by provider) Vagifem vaginal tablet Inserted nightly for 2 weeks then twice a week (long term) lntrarosa vaginal suppository Vaginal DHEA: converts to estrogen in vaginal tissue without systemic effect Inserted nightly (long term) Vaginal laser therapy (Mona Lisa touch) Performed in 3 treatments each 6 weeks apart (available in our [place] office). Can feel like a sunburn for 3-4 days after each treatment until new skin heals in. Usually not covered by insurance. Estimated cost is $1500 for all 3 sessions.  FYI regarding prescription vaginal estrogen treatment options: All topical vaginal estrogen replacement options are equivalent in terms of efficacy. Topical vaginal estrogen replacement will take about 3 months to be effective. OK to have sex with any of the topical vaginal estrogen replacement options. Topical vaginal estrogen replacement may sting/burn initially due to severe dryness, which will improve with ongoing treatment. There have been studies that evaluate use of low-dose intravaginal estrogen that show minimal systemic absorption which is negligible after 3 weeks. There have been no studies indicating increased risk of contributing to cancer development or recurrence.  Topical vaginal estrogen cream safe to use with breast cancer history [email or web address]  Topical vaginal estrogen cream safe to use with blood clot  history [email or web address]   Lubricants and Moisturizers for Treating Genitourinary Syndrome of Menopause and Vulvovaginal Atrophy Treatment Comments I Available Products   Lubricants   Water-based Ingredients: Deionized water, glycerin, propylene glycol; latex safe; rare irritation; dry out with extended sexual activity Astroglide, Good Clean Love, K-Y Jelly, Natural, Organic, Pink, Sliquid, Sylk, Yes    Oil Based Ingredients: avocado, olive, peanut, corn; latex safe; can be used with silicone products; staining; safe (unless peanut allergy); non-irritating Coconut oil, vegetable oil, vitamin E oil  Silicone-Based Ingredients: Silicone polymers; staining; typically nonirritating, long lasting; waterproof; should not be used with silicone dilators, sexual toys, or gynecologic products Astroglide X, Oceanus Ultra Pure, Pink Silicone, Pjur Eros, Replens Silky Smooth, Silicone Premium JO, SKYN, Uberlube, [employer] Based Minimize harm to sperm motility; designed Astroglide TTC, Conceive Plus, Pre for couples trying to conceive Seed, Yes Baby  Fertility Friendly Minimize harm to sperm motility; designed Astroglide, TTC, Conceive Plus, Pre for couples trying to conceive Seed, Yes Baby  Vaginal Moisturizers   Vaginal Moisturizers For maintenance use 1 to 3 times weekly; can benefit women with dryness, chafing with AOL, and recurrent vaginal infections irrespective of sexual activity timing Balance Active Menopause Vaginal Moisturizing Lubricant, Canesintima Intimate Moisturizer, Replens, Rephresh, Sylk Natural Intimate Moisturizer, Yes Vaginal Moisturizer  Hybrids Properties of both water and silicone-based products (combination of a vaginal lubricant and moisturizer); Non-irritating; good option for women with allergies and  sensitivities Lubrigyn, Luvena  Suppositories Hyaluronic acid to retain moisture Revaree  Vulvar Soothing Creams/Oils    Medicated CreamsP ain and burn relief; Ingredients: 4% Lidocaine, Aloe Vera gel Releveum ([place])  Non-Medicated Creams For anti-itch and moisture/maintenance; Ingredients: Coconut oil, Avocado oil, Shea Butter, Olive oil, Vitamin E Vajuvenate, Vmagic  Oils !For moisture/maintenance !Coconut oil, Vitamin E oil, Emu oil

## 2023-02-21 ENCOUNTER — Other Ambulatory Visit: Payer: Self-pay | Admitting: Neurology

## 2023-03-27 ENCOUNTER — Other Ambulatory Visit: Payer: Self-pay | Admitting: Neurology

## 2023-03-27 NOTE — Telephone Encounter (Signed)
Dispensed Days Supply Quantity Provider Pharmacy  CLOBAZAM 2.5 MG/ML SUSPENSION 12/07/2022 28 28 mL Glean Salvo, NP [facility]...  CLOBAZAM 2.5 MG/ML SUSPENSION 11/01/2022 28 28 mL Glean Salvo, NP [facility] - [facility]...  cloBAZam (ONFI) oral suspension 2.5 mg/mL 11/01/2022 90 90 Glean Salvo, NP [facility] - [facility]...     Last visit 11/01/22 Next visit 10/31/23

## 2023-03-30 ENCOUNTER — Other Ambulatory Visit: Payer: Self-pay | Admitting: Neurology

## 2023-04-11 DIAGNOSIS — F0280 Dementia in other diseases classified elsewhere without behavioral disturbance: Secondary | ICD-10-CM | POA: Diagnosis not present

## 2023-04-11 DIAGNOSIS — G309 Alzheimer's disease, unspecified: Secondary | ICD-10-CM | POA: Diagnosis not present

## 2023-04-11 DIAGNOSIS — R569 Unspecified convulsions: Secondary | ICD-10-CM | POA: Diagnosis not present

## 2023-04-11 DIAGNOSIS — Z299 Encounter for prophylactic measures, unspecified: Secondary | ICD-10-CM | POA: Diagnosis not present

## 2023-04-11 DIAGNOSIS — U071 COVID-19: Secondary | ICD-10-CM | POA: Diagnosis not present

## 2023-04-20 DIAGNOSIS — J069 Acute upper respiratory infection, unspecified: Secondary | ICD-10-CM | POA: Diagnosis not present

## 2023-04-20 DIAGNOSIS — Z299 Encounter for prophylactic measures, unspecified: Secondary | ICD-10-CM | POA: Diagnosis not present

## 2023-04-20 DIAGNOSIS — R059 Cough, unspecified: Secondary | ICD-10-CM | POA: Diagnosis not present

## 2023-04-20 DIAGNOSIS — I10 Essential (primary) hypertension: Secondary | ICD-10-CM | POA: Diagnosis not present

## 2023-04-27 DIAGNOSIS — I129 Hypertensive chronic kidney disease with stage 1 through stage 4 chronic kidney disease, or unspecified chronic kidney disease: Secondary | ICD-10-CM | POA: Diagnosis not present

## 2023-04-27 DIAGNOSIS — D509 Iron deficiency anemia, unspecified: Secondary | ICD-10-CM | POA: Diagnosis not present

## 2023-04-27 DIAGNOSIS — D638 Anemia in other chronic diseases classified elsewhere: Secondary | ICD-10-CM | POA: Diagnosis not present

## 2023-04-27 DIAGNOSIS — N1831 Chronic kidney disease, stage 3a: Secondary | ICD-10-CM | POA: Diagnosis not present

## 2023-05-02 NOTE — Progress Notes (Signed)
 [facility] [address], [place] [address]   Clinic Day:  05/03/2023  Referring physician: Kirstie Peri, MD  Patient Care Team: Kirstie Peri, MD as PCP - General (Internal Medicine)   ASSESSMENT & PLAN:   Assessment:  1.  Normocytic anemia: - Patient seen at the request of Dr. Wolfgang Phoenix. - Last labs in November 2024 showed hemoglobin 9.6. - She is taking iron tablet daily for the last few weeks.  Denies any BRBPR/melena.  Ice pica positive.  No prior transfusion history.  2.  CKD stage IIIa: - CKD since 2019, secondary to single functioning kidney.  CKD most likely secondary to her history of multiple UTIs/obstructive issues, hypertension, followed closely by Dr. Wolfgang Phoenix.  She has nonnephrotic range proteinuria.  3.  Social/family history: - Lives by herself at home.  She is accompanied by her sister today.  She is independent of ADLs and IADLs.  Non-smoker.  No chemical exposure.  No family history of anemia.  Sister had breast cancer and niece had pancreatic cancer.  Paternal uncle had breast cancer.  Plan:  1.  Normocytic anemia: - Likely anemia from combination of CKD and nutritional deficiency. - Will repeat CBC today and check for hemolysis, nutritional deficiencies and bone marrow infiltrative process.  She complains of severe fatigue.  Will also check TSH level. - RTC 2 weeks for follow-up.   No orders of the defined types were placed in this encounter.     Alben Deeds Teague,acting as a Neurosurgeon for Doreatha Massed, MD.,have documented all relevant documentation on the behalf of Doreatha Massed, MD,as directed by  Doreatha Massed, MD while in the presence of Doreatha Massed, MD.   I, Doreatha Massed MD, have reviewed the above documentation for accuracy and completeness, and I agree with the above.   Doreatha Massed, MD   2/26/20251:55 PM  CHIEF COMPLAINT/PURPOSE OF CONSULT:   Diagnosis: Normocytic anemia  Current  Therapy: Under workup  HISTORY OF PRESENT ILLNESS:   Sheri Valencia is a 78 y.o. female presenting to clinic today for evaluation of IDA at the request of Dr. Wolfgang Phoenix.  Patient has a medical history of CKD stage 3a, Alzheimer's, and essential hypertension.   Her most recent CBC from 01/16/23 found low HGB at 9.6, low HCT at 30.5, low MCH at 25.4, and elevated RDW at 15.7. Iron from 11/11 was low at 22 and iron saturation was severely low at 6. Justiss has supposedly been started on oral iron supplements by Dr. Wolfgang Phoenix.  IFE, PE, and FLC serum were done on 01/16/23 with elevated IgA at 701, elevated beta globulin at 1.4, elevated free kappa light chains at 48.7, qand elevated free lambda light chains at 43.2.   Today, she states that she is doing well overall. Her appetite level is at 80%. Her energy level is at 10%.  PAST MEDICAL HISTORY:   Past Medical History: Past Medical History:  Diagnosis Date   Depression    Memory loss    Seizure (HCC)    last seizure qwas over 1 year ago.    Surgical History: Past Surgical History:  Procedure Laterality Date   APPENDECTOMY     BIOPSY  08/16/2022   Procedure: BIOPSY;  Surgeon: Dolores Frame, MD;  [facility];  Service: Gastroenterology;;   COLONOSCOPY WITH PROPOFOL N/A 08/16/2022   Procedure: COLONOSCOPY WITH PROPOFOL;  Surgeon: Dolores Frame, MD;  [facility]: [facility];  Service: Gastroenterology;  Laterality: N/A;  730am, asa 3  HEMOSTASIS CLIP PLACEMENT  08/16/2022   Procedure: HEMOSTASIS CLIP PLACEMENT;  Surgeon: Dolores Frame, MD;  [facility]: [facility];  Service: Gastroenterology;;   POLYPECTOMY  08/16/2022   Procedure: POLYPECTOMY;  Surgeon: Dolores Frame, MD;  [facility];  Service: Gastroenterology;;   Susa Day  08/16/2022   Procedure: Susa Day;  Surgeon: Marguerita Merles, Reuel Boom, MD;  [facility];  Service: Gastroenterology;;   SUBMUCOSAL  TATTOO INJECTION  08/16/2022   Procedure: SUBMUCOSAL TATTOO INJECTION;  Surgeon: Dolores Frame, MD;  [facility];  Service: Gastroenterology;;   TUBAL LIGATION      Social History: Social History   Socioeconomic History   Marital status: Widowed    Spouse name: Not on file   Number of children: 0   Years of education: college   Highest education level: Not on file  Occupational History    Employer: RETIRED    Comment: retired  Tobacco Use   Smoking status: Never    Passive exposure: Never   Smokeless tobacco: Never  Vaping Use   Vaping status: Never Used  Substance and Sexual Activity   Alcohol use: Yes    Alcohol/week: 1.0 standard drink of alcohol    Types: 1 Glasses of wine per week   Drug use: No   Sexual activity: Not Currently  Other Topics Concern   Not on file  Social History Narrative   Patient lives at home alone and she is widowed. Retired.   College education    Caffeine one cup daily.         Social Drivers of Corporate investment banker Strain: Not on file  Food Insecurity: No Food Insecurity (05/03/2023)   Hunger Vital Sign    Worried About Running Out of Food in the Last Year: Never true    Ran Out of Food in the Last Year: Never true  Transportation Needs: No Transportation Needs (05/03/2023)   PRAPARE - Administrator, Civil Service (Medical): No    Lack of Transportation (Non-Medical): No  Physical Activity: Not on file  Stress: Not on file  Social Connections: Unknown (07/19/2021)   [facility]   Social Network    Social Network: Not on file  Intimate Partner Violence: Not At Risk (05/03/2023)   Humiliation, Afraid, Rape, and Kick questionnaire    Fear of Current or Ex-Partner: No    Emotionally Abused: No    Physically Abused: No    Sexually Abused: No    Family History: Family History  Problem Relation Age of Onset   Pneumonia Mother    Heart attack Maternal  Grandmother    Stroke Maternal Grandmother    Suicidality Brother    Aneurysm Maternal Aunt    Breast cancer Sister        04/2015  Stage I    Current Medications:  Current Outpatient Medications:    alendronate (FOSAMAX) 70 MG tablet, Take 70 mg by mouth every Friday., Disp: , Rfl:    aspirin EC 81 MG tablet, Take 81 mg by mouth every 6 (six) hours as needed for moderate pain., Disp: , Rfl:    cloBAZam (ONFI) 2.5 MG/ML solution, TAKE 1 ML BY MOUTH AT BEDTIME., Disp: 30 mL, Rfl: 4   FEROSUL 325 (65 Fe) MG tablet, Take by mouth., Disp: , Rfl:    LamoTRIgine (LAMICTAL XR) 25 MG TB24 24 hour tablet, Take 2 tablets (50 mg total) by mouth daily., Disp:  60 tablet, Rfl: 11   LamoTRIgine 250 MG TB24 24 hour tablet, Take 1 tablet (250 mg total) by mouth at bedtime., Disp: 90 tablet, Rfl: 4   memantine (NAMENDA) 10 MG tablet, TAKE 1 TABLET BY MOUTH TWICE DAILY., Disp: 180 tablet, Rfl: 1   rosuvastatin (CRESTOR) 5 MG tablet, Take 10 mg by mouth daily., Disp: , Rfl:    sertraline (ZOLOFT) 100 MG tablet, Take 100 mg by mouth daily., Disp: , Rfl:    Allergies: Allergies  Allergen Reactions   Lorazepam     confusion  Other Reaction(s): Mental Status Changes    REVIEW OF SYSTEMS:   Review of Systems  Constitutional:  Positive for fatigue. Negative for chills and fever.  HENT:   Negative for lump/mass, mouth sores, nosebleeds, sore throat and trouble swallowing.   Eyes:  Negative for eye problems.  Respiratory:  Positive for cough. Negative for shortness of breath.   Cardiovascular:  Negative for chest pain, leg swelling and palpitations.  Gastrointestinal:  Negative for abdominal pain, constipation, diarrhea, nausea and vomiting.  Genitourinary:  Negative for bladder incontinence, difficulty urinating, dysuria, frequency, hematuria and nocturia.   Musculoskeletal:  Negative for arthralgias, back pain, flank pain, myalgias and neck pain.  Skin:  Negative for itching and rash.  Neurological:   Positive for dizziness. Negative for headaches and numbness.  Hematological:  Does not bruise/bleed easily.  Psychiatric/Behavioral:  Negative for depression, sleep disturbance and suicidal ideas. The patient is not nervous/anxious.   All other systems reviewed and are negative.    VITALS:   Blood pressure (!) 162/62, pulse 70, temperature (!) 97.2 F (36.2 C), temperature source Tympanic, resp. rate 18, height 4' 11.84" (1.52 m), weight 99 lb 10.4 oz (45.2 kg), SpO2 100%.  Wt Readings from Last 3 Encounters:  05/03/23 99 lb 10.4 oz (45.2 kg)  11/01/22 100 lb 8 oz (45.6 kg)  08/16/22 102 lb (46.3 kg)    Body mass index is 19.56 kg/m.   PHYSICAL EXAM:   Physical Exam Vitals and nursing note reviewed. Exam conducted with a chaperone present.  Constitutional:      Appearance: Normal appearance.  Cardiovascular:     Rate and Rhythm: Normal rate and regular rhythm.     Pulses: Normal pulses.     Heart sounds: Normal heart sounds.  Pulmonary:     Effort: Pulmonary effort is normal.     Breath sounds: Normal breath sounds.  Abdominal:     Palpations: Abdomen is soft. There is no hepatomegaly, splenomegaly or mass.     Tenderness: There is no abdominal tenderness.  Musculoskeletal:     Right lower leg: No edema.     Left lower leg: No edema.  Lymphadenopathy:     Cervical: No cervical adenopathy.     Right cervical: No superficial, deep or posterior cervical adenopathy.    Left cervical: No superficial, deep or posterior cervical adenopathy.     Upper Body:     Right upper body: No supraclavicular or axillary adenopathy.     Left upper body: No supraclavicular or axillary adenopathy.  Neurological:     General: No focal deficit present.     Mental Status: She is alert and oriented to person, place, and time.  Psychiatric:        Mood and Affect: Mood normal.        Behavior: Behavior normal.     LABS:   CBC    Component Value Date/Time   WBC 9.1 12/04/2019  0849    WBC 12.4 (H) 03/03/2019 0916   RBC 3.99 12/04/2019 0849   RBC 4.00 03/03/2019 0916   HGB 11.4 12/04/2019 0849   HCT 34.5 12/04/2019 0849   PLT 307 12/04/2019 0849   MCV 87 12/04/2019 0849   MCH 28.6 12/04/2019 0849   MCH 28.3 03/03/2019 0916   MCHC 33.0 12/04/2019 0849   MCHC 31.8 03/03/2019 0916   RDW 12.7 12/04/2019 0849   LYMPHSABS 1.3 12/04/2019 0849   MONOABS 0.6 03/03/2019 0916   EOSABS 0.1 12/04/2019 0849   BASOSABS 0.1 12/04/2019 0849    CMP    Component Value Date/Time   NA 137 12/04/2019 0849   K 5.3 (H) 12/04/2019 0849   CL 101 12/04/2019 0849   CO2 24 12/04/2019 0849   GLUCOSE 94 12/04/2019 0849   GLUCOSE 122 (H) 03/03/2019 0916   BUN 12 12/04/2019 0849   CREATININE 1.05 (H) 12/04/2019 0849   CALCIUM 9.6 12/04/2019 0849   PROT 7.1 12/04/2019 0849   ALBUMIN 4.3 12/04/2019 0849   AST 33 12/04/2019 0849   ALT 23 12/04/2019 0849   ALKPHOS 123 (H) 12/04/2019 0849   BILITOT 0.2 12/04/2019 0849   GFRNONAA 53 (L) 12/04/2019 0849   GFRAA 61 12/04/2019 0849    No results found for: "CEA1", "CEA" / No results found for: "CEA1", "CEA" No results found for: "PSA1" No results found for: "ZOX096" No results found for: "CAN125"  No results found for: "TOTALPROTELP", "ALBUMINELP", "A1GS", "A2GS", "BETS", "BETA2SER", "GAMS", "MSPIKE", "SPEI" No results found for: "TIBC", "FERRITIN", "IRONPCTSAT" No results found for: "LDH"   STUDIES:   No results found.

## 2023-05-03 ENCOUNTER — Inpatient Hospital Stay: Payer: PPO

## 2023-05-03 ENCOUNTER — Encounter: Payer: Self-pay | Admitting: Hematology

## 2023-05-03 ENCOUNTER — Inpatient Hospital Stay: Payer: PPO | Attending: Hematology | Admitting: Hematology

## 2023-05-03 VITALS — BP 162/62 | HR 70 | Temp 97.2°F | Resp 18 | Ht 59.84 in | Wt 99.6 lb

## 2023-05-03 DIAGNOSIS — N1831 Chronic kidney disease, stage 3a: Secondary | ICD-10-CM | POA: Insufficient documentation

## 2023-05-03 DIAGNOSIS — Z818 Family history of other mental and behavioral disorders: Secondary | ICD-10-CM | POA: Insufficient documentation

## 2023-05-03 DIAGNOSIS — I129 Hypertensive chronic kidney disease with stage 1 through stage 4 chronic kidney disease, or unspecified chronic kidney disease: Secondary | ICD-10-CM | POA: Diagnosis not present

## 2023-05-03 DIAGNOSIS — F5089 Other specified eating disorder: Secondary | ICD-10-CM | POA: Diagnosis not present

## 2023-05-03 DIAGNOSIS — D649 Anemia, unspecified: Secondary | ICD-10-CM | POA: Insufficient documentation

## 2023-05-03 DIAGNOSIS — N189 Chronic kidney disease, unspecified: Secondary | ICD-10-CM

## 2023-05-03 DIAGNOSIS — Z823 Family history of stroke: Secondary | ICD-10-CM | POA: Insufficient documentation

## 2023-05-03 DIAGNOSIS — Z8249 Family history of ischemic heart disease and other diseases of the circulatory system: Secondary | ICD-10-CM | POA: Diagnosis not present

## 2023-05-03 DIAGNOSIS — Z825 Family history of asthma and other chronic lower respiratory diseases: Secondary | ICD-10-CM | POA: Diagnosis not present

## 2023-05-03 DIAGNOSIS — I10 Essential (primary) hypertension: Secondary | ICD-10-CM | POA: Diagnosis not present

## 2023-05-03 DIAGNOSIS — D631 Anemia in chronic kidney disease: Secondary | ICD-10-CM

## 2023-05-03 DIAGNOSIS — R5383 Other fatigue: Secondary | ICD-10-CM

## 2023-05-03 DIAGNOSIS — F0283 Dementia in other diseases classified elsewhere, unspecified severity, with mood disturbance: Secondary | ICD-10-CM | POA: Insufficient documentation

## 2023-05-03 DIAGNOSIS — Z9049 Acquired absence of other specified parts of digestive tract: Secondary | ICD-10-CM | POA: Diagnosis not present

## 2023-05-03 DIAGNOSIS — Z87440 Personal history of urinary (tract) infections: Secondary | ICD-10-CM | POA: Insufficient documentation

## 2023-05-03 DIAGNOSIS — Z79899 Other long term (current) drug therapy: Secondary | ICD-10-CM | POA: Insufficient documentation

## 2023-05-03 DIAGNOSIS — G309 Alzheimer's disease, unspecified: Secondary | ICD-10-CM | POA: Diagnosis not present

## 2023-05-03 DIAGNOSIS — Z803 Family history of malignant neoplasm of breast: Secondary | ICD-10-CM | POA: Insufficient documentation

## 2023-05-03 LAB — COMPREHENSIVE METABOLIC PANEL
ALT: 17 U/L (ref 0–44)
AST: 23 U/L (ref 15–41)
Albumin: 3.5 g/dL (ref 3.5–5.0)
Alkaline Phosphatase: 82 U/L (ref 38–126)
Anion gap: 10 (ref 5–15)
BUN: 16 mg/dL (ref 8–23)
CO2: 24 mmol/L (ref 22–32)
Calcium: 9 mg/dL (ref 8.9–10.3)
Chloride: 102 mmol/L (ref 98–111)
Creatinine, Ser: 1.13 mg/dL — ABNORMAL HIGH (ref 0.44–1.00)
GFR, Estimated: 51 mL/min — ABNORMAL LOW (ref >60)
Glucose, Bld: 99 mg/dL (ref 70–99)
Potassium: 4.4 mmol/L (ref 3.5–5.1)
Sodium: 136 mmol/L (ref 135–145)
Total Bilirubin: 0.3 mg/dL (ref 0.0–1.2)
Total Protein: 7.1 g/dL (ref 6.5–8.1)

## 2023-05-03 LAB — TSH: TSH: 2.217 u[IU]/mL (ref 0.350–4.500)

## 2023-05-03 LAB — CBC WITH DIFFERENTIAL/PLATELET
Abs Immature Granulocytes: 0.03 10*3/uL (ref 0.00–0.07)
Basophils Absolute: 0.1 10*3/uL (ref 0.0–0.1)
Basophils Relative: 0 %
Eosinophils Absolute: 0.3 10*3/uL (ref 0.0–0.5)
Eosinophils Relative: 3 %
HCT: 31.2 % — ABNORMAL LOW (ref 36.0–46.0)
Hemoglobin: 9.7 g/dL — ABNORMAL LOW (ref 12.0–15.0)
Immature Granulocytes: 0 %
Lymphocytes Relative: 14 %
Lymphs Abs: 1.6 10*3/uL (ref 0.7–4.0)
MCH: 25.1 pg — ABNORMAL LOW (ref 26.0–34.0)
MCHC: 31.1 g/dL (ref 30.0–36.0)
MCV: 80.6 fL (ref 80.0–100.0)
Monocytes Absolute: 1 10*3/uL (ref 0.1–1.0)
Monocytes Relative: 9 %
Neutro Abs: 8.3 10*3/uL — ABNORMAL HIGH (ref 1.7–7.7)
Neutrophils Relative %: 74 %
Platelets: 263 10*3/uL (ref 150–400)
RBC: 3.87 MIL/uL (ref 3.87–5.11)
RDW: 16.6 % — ABNORMAL HIGH (ref 11.5–15.5)
WBC: 11.2 10*3/uL — ABNORMAL HIGH (ref 4.0–10.5)
nRBC: 0 % (ref 0.0–0.2)

## 2023-05-03 LAB — FOLATE: Folate: 20.6 ng/mL (ref >5.9)

## 2023-05-03 LAB — IRON AND TIBC
Iron: 22 ug/dL — ABNORMAL LOW (ref 28–170)
Saturation Ratios: 6 % — ABNORMAL LOW (ref 10.4–31.8)
TIBC: 357 ug/dL (ref 250–450)
UIBC: 335 ug/dL

## 2023-05-03 LAB — RETICULOCYTES
Immature Retic Fract: 5.7 % (ref 2.3–15.9)
RBC.: 3.78 MIL/uL — ABNORMAL LOW (ref 3.87–5.11)
Retic Count, Absolute: 33.3 10*3/uL (ref 19.0–186.0)
Retic Ct Pct: 0.9 % (ref 0.4–3.1)

## 2023-05-03 LAB — DIRECT ANTIGLOBULIN TEST (NOT AT ARMC)
DAT, IgG: NEGATIVE
DAT, complement: NEGATIVE

## 2023-05-03 LAB — FERRITIN: Ferritin: 33 ng/mL (ref 11–307)

## 2023-05-03 LAB — VITAMIN B12: Vitamin B-12: 993 pg/mL — ABNORMAL HIGH (ref 180–914)

## 2023-05-03 LAB — LACTATE DEHYDROGENASE: LDH: 159 U/L (ref 98–192)

## 2023-05-03 NOTE — Patient Instructions (Signed)
 You were seen and examined today by Dr. Ellin Saba. Dr. Ellin Saba is a hematologist, meaning that he specializes in blood abnormalities. Dr. Ellin Saba discussed your past medical history, family history of cancers/blood conditions and the events that led to you being here today.  You were referred to Dr. Ellin Saba due to anemia (low hemoglobin).  Dr. Ellin Saba has recommended additional labs today for further evaluation.  Follow-up as scheduled.

## 2023-05-04 LAB — KAPPA/LAMBDA LIGHT CHAINS
Kappa free light chain: 39.5 mg/L — ABNORMAL HIGH (ref 3.3–19.4)
Kappa, lambda light chain ratio: 1.07 (ref 0.26–1.65)
Lambda free light chains: 36.8 mg/L — ABNORMAL HIGH (ref 5.7–26.3)

## 2023-05-05 LAB — COPPER, SERUM: Copper: 148 ug/dL (ref 80–158)

## 2023-05-07 LAB — METHYLMALONIC ACID, SERUM: Methylmalonic Acid, Quantitative: 418 nmol/L — ABNORMAL HIGH (ref 0–378)

## 2023-05-08 LAB — PROTEIN ELECTROPHORESIS, SERUM
A/G Ratio: 1 (ref 0.7–1.7)
Albumin ELP: 3.3 g/dL (ref 2.9–4.4)
Alpha-1-Globulin: 0.4 g/dL (ref 0.0–0.4)
Alpha-2-Globulin: 0.9 g/dL (ref 0.4–1.0)
Beta Globulin: 1.3 g/dL (ref 0.7–1.3)
Gamma Globulin: 0.7 g/dL (ref 0.4–1.8)
Globulin, Total: 3.3 g/dL (ref 2.2–3.9)
Total Protein ELP: 6.6 g/dL (ref 6.0–8.5)

## 2023-05-08 LAB — IMMUNOFIXATION ELECTROPHORESIS
IgA: 620 mg/dL — ABNORMAL HIGH (ref 64–422)
IgG (Immunoglobin G), Serum: 904 mg/dL (ref 586–1602)
IgM (Immunoglobulin M), Srm: 113 mg/dL (ref 26–217)
Total Protein ELP: 6.7 g/dL (ref 6.0–8.5)

## 2023-05-13 NOTE — Progress Notes (Unsigned)
 Name: Sheri Valencia DOB: 07/14/1945 MRN: [id number]  History of Present Illness: Ms. Valencia is a 78 y.o. female who presents today for follow up visit at [facility]. She is accompanied by her sister Sheri Valencia, who assists with providing history due to patient's Alzheimer's dementia.  At initial visit on 02/14/2023: 1. History of recurrent UTIs (per referring provider) with insufficient urine culture data: - Question if she was actually having asymptomatic bacteriuria when treated with antibiotics for "UTI" in the past year. "Reports being treated for at least 2 UTls in past 12 months. She denies ever having any UTI symptoms with those. Her sister does report that Sheri Valencia had a UTI about 2 years ago with correlating symptoms including acute mental status change (confusion), weakness, altered behavior." - UA today appears abnormal however patient is asymptomatic for UTI, therefore acute antibiotic treatment is not advised at this time. The rationale for this was discussed with the patient.  2. Nocturnal enuresis / urine leakage without sensory awareness overnight.  - "At baseline she reports urinary frequency, nocturia x1, urgency, and nocturnal enuresis. Denies daytime urinary incontinence. Wears pads overnight." - Not significantly bothersome per patient. 3. Pelvic organ prolapse.  - Not significantly bothersome per patient.  - No evidence of significant incomplete bladder emptying (PVR 64 ml).  - We discussed the pathophysiology of this condition briefly and option to consider pessary fitting. She elected to proceed with expectant management.  4. Microscopic hematuria. We discussed that this is suspected to be attributable to vaginal atrophy, particularly given her age and negative CT in August 2024. 5. CKD. - Followed by Dr. Wolfgang Phoenix.  - 10/14/2022: CT abdomen/pelvis with contrast at [facility] showed "Marked left renal parenchymal atrophy. Compensatory hypertrophy of the right kidney.  No focal lesion, evident urolithiasis, or hydronephrosis." - 05/03/2023: GFR 51, creatinine 1.13.  - The plan was:  Requesting outside urine culture results. Start use of topical vaginal estrogen cream every night at bedtime. Return in about 3 months (around 05/15/2023) for UA, PVR, & f/u with Evette Georges NP.  Since last visit: Per review of scanned records received from PCP: - 10/05/2022: Negative urine culture. - 10/11/2022: UA appeared abnormal but no urine culture data.  Today: She reports 0 UTIs since last visit. She denies ever starting use of topical vaginal estrogen cream as planned.  She denies dysuria, gross hematuria, straining to void, or sensations of incomplete emptying.  Reports persistent urinary urgency or frequency. Reports increased bother from nocturnal enuresis at least 1x/week. Wear pads overnight.  She denies vaginal pain, bleeding, abnormal discharge, itching, dryness. Denies bother from vaginal bulge; states "when I pee I just take my finger and shove it back".    Medications: Current Outpatient Medications  Medication Sig Dispense Refill   alendronate (FOSAMAX) 70 MG tablet Take 70 mg by mouth every Friday.     aspirin EC 81 MG tablet Take 81 mg by mouth every 6 (six) hours as needed for moderate pain.     cloBAZam (ONFI) 2.5 MG/ML solution TAKE 1 ML BY MOUTH AT BEDTIME. 30 mL 4   estradiol (ESTRACE) 0.1 MG/GM vaginal cream Discard plastic applicator. Insert a blueberry size amount (approximately 1 gram) of cream on fingertip inside vagina every night at bedtime. For long term use. 30 g 3   FEROSUL 325 (65 Fe) MG tablet Take by mouth.     LamoTRIgine (LAMICTAL XR) 25 MG TB24 24 hour tablet Take 2 tablets (50 mg total) by mouth  daily. 60 tablet 11   LamoTRIgine 250 MG TB24 24 hour tablet Take 1 tablet (250 mg total) by mouth at bedtime. 90 tablet 4   memantine (NAMENDA) 10 MG tablet TAKE 1 TABLET BY MOUTH TWICE DAILY. 180 tablet 1   rosuvastatin (CRESTOR) 5 MG  tablet Take 10 mg by mouth daily.     sertraline (ZOLOFT) 100 MG tablet Take 100 mg by mouth daily.     No current facility-administered medications for this visit.    Allergies: Allergies  Allergen Reactions   Lorazepam     confusion  Other Reaction(s): Mental Status Changes    Past Medical History:  Diagnosis Date   Depression    Memory loss    Seizure (HCC)    last seizure qwas over 1 year ago.   Past Surgical History:  Procedure Laterality Date   APPENDECTOMY     BIOPSY  08/16/2022   Procedure: BIOPSY;  Surgeon: Dolores Frame, MD;  [facility]: [facility];  Service: Gastroenterology;;   COLONOSCOPY WITH PROPOFOL N/A 08/16/2022   Procedure: COLONOSCOPY WITH PROPOFOL;  Surgeon: Dolores Frame, MD;  [facility]: [facility];  Service: Gastroenterology;  Laterality: N/A;  730am, asa 3   HEMOSTASIS CLIP PLACEMENT  08/16/2022   Procedure: HEMOSTASIS CLIP PLACEMENT;  Surgeon: Dolores Frame, MD;  [facility]: [facility];  Service: Gastroenterology;;   POLYPECTOMY  08/16/2022   Procedure: POLYPECTOMY;  Surgeon: Dolores Frame, MD;  [facility]: [facility];  Service: Gastroenterology;;   Susa Day  08/16/2022   Procedure: Susa Day;  Surgeon: Marguerita Merles, Reuel Boom, MD;  [facility]: [facility];  Service: Gastroenterology;;   SUBMUCOSAL TATTOO INJECTION  08/16/2022   Procedure: SUBMUCOSAL TATTOO INJECTION;  Surgeon: Dolores Frame, MD;  [facility]: [facility];  Service: Gastroenterology;;   TUBAL LIGATION     Family History  Problem Relation Age of Onset   Pneumonia Mother    Heart attack Maternal Grandmother    Stroke Maternal Grandmother    Suicidality Brother    Aneurysm Maternal Aunt    Breast cancer Sister        04/2015  Stage I   Social History   Socioeconomic History   Marital status: Widowed    Spouse name: Not on file   Number of children: 0   Years of education: college   Highest education level:  Not on file  Occupational History    Employer: RETIRED    Comment: retired  Tobacco Use   Smoking status: Never    Passive exposure: Never   Smokeless tobacco: Never  Vaping Use   Vaping status: Never Used  Substance and Sexual Activity   Alcohol use: Yes    Alcohol/week: 1.0 standard drink of alcohol    Types: 1 Glasses of wine per week   Drug use: No   Sexual activity: Not Currently  Other Topics Concern   Not on file  Social History Narrative   Patient lives at home alone and she is widowed. Retired.   College education    Caffeine one cup daily.         Social Drivers of Corporate investment banker Strain: Not on file  Food Insecurity: No Food Insecurity (05/03/2023)   Hunger Vital Sign    Worried About Running Out of Food in the Last Year: Never true    Ran Out of Food in the Last Year: Never true  Transportation Needs: No Transportation Needs (05/03/2023)   PRAPARE - Transportation  Lack of Transportation (Medical): No    Lack of Transportation (Non-Medical): No  Physical Activity: Not on file  Stress: Not on file  Social Connections: Unknown (07/19/2021)   Received from [facility]   Social Network    Social Network: Not on file  Intimate Partner Violence: Not At Risk (05/03/2023)   Humiliation, Afraid, Rape, and Kick questionnaire    Fear of Current or Ex-Partner: No    Emotionally Abused: No    Physically Abused: No    Sexually Abused: No    Review of Systems Constitutional: Patient denies any unintentional weight loss or change in strength lntegumentary: Patient denies any rashes or pruritus Cardiovascular: Patient denies chest pain or syncope Respiratory: Patient denies shortness of breath Gastrointestinal: Patient denies constipation or diarrhea Musculoskeletal: Patient denies muscle cramps or weakness Neurologic: Patient denies convulsions or seizures Allergic/Immunologic: Patient denies recent allergic  reaction(s) Hematologic/Lymphatic: Patient denies bleeding tendencies Endocrine: Patient denies heat/cold intolerance  GU: As per HPI.  OBJECTIVE Vitals:   05/15/23 1317  BP: (!) 151/60  Pulse: 81   There is no height or weight on file to calculate BMI.  Physical Examination Constitutional: No obvious distress; patient is non-toxic appearing  Cardiovascular: No visible lower extremity edema.  Respiratory: The patient does not have audible wheezing/stridor; respirations do not appear labored  Gastrointestinal: Abdomen non-distended Musculoskeletal: Normal ROM of UEs  Skin: No obvious rashes/open sores  Neurologic: CN 2-12 grossly intact Psychiatric: Answered questions appropriately with normal affect  Hematologic/Lymphatic/Immunologic: No obvious bruises or sites of spontaneous bleeding  Urine microscopy:  > Voided urine: 11-30 WBC/hpf, 3-10 RBC/hpf, moderate bacteria > I&O cath urine: 0-2 WBC/hpf, 3-10 RBC/hpf, few bacteria  PVR: 71 ml  ASSESSMENT Urinary incontinence, unspecified type - Plan: Urinalysis, Routine w reflex microscopic, BLADDER SCAN AMB NON-IMAGING, estradiol (ESTRACE) 0.1 MG/GM vaginal cream  History of recurrent UTI (urinary tract infection) - Plan: estradiol (ESTRACE) 0.1 MG/GM vaginal cream, CT HEMATURIA WORKUP  Microscopic hematuria - Plan: estradiol (ESTRACE) 0.1 MG/GM vaginal cream, CT HEMATURIA WORKUP  Atrophic vaginitis - Plan: estradiol (ESTRACE) 0.1 MG/GM vaginal cream  Female genital prolapse, unspecified type - Plan: estradiol (ESTRACE) 0.1 MG/GM vaginal cream  Alzheimer's dementia, unspecified dementia severity, unspecified timing of dementia onset, unspecified whether behavioral, psychotic, or mood disturbance or anxiety (HCC)  No evidence of UTI per I&O cath urine today. Advised to start topical vaginal estrogen cream as previously planned for UTI prevention; we reviewed the rationale for that and new prescription sent.   For persistent  microscopic hematuria we discussed possible etiologies including but not limited to: vigorous exercise, sexual activity, stone, trauma, blood thinner use, urinary tract infection, urethral irritation secondary to vaginal atrophy, chronic kidney disease, glomerulonephropathy, malignancy.  We reviewed the AUA 2020 [facility] guideline and risk stratification for this patient. Based on individual risk factors, pt was advised that the recommended workup includes CT hematuria protocol and cystoscopy.   Pt decided to pursue this work-up and follow-up afterward to discuss the results and formulate a treatment plan based on the findings. All questions were answered.   PLAN Advised the following: 1. CT hematuria. 2. Topical vaginal estrogen cream use. 3. Return for 1st available cystoscopy with any urology MD.  Orders Placed This Encounter  Procedures   CT HEMATURIA WORKUP    Standing Status:   Future    Expiration Date:   05/14/2024    Reason for Exam (SYMPTOM  OR DIAGNOSIS REQUIRED):   Microscopic hematuria    Preferred  imaging location?:   [facility]   Urinalysis, Routine w reflex microscopic   BLADDER SCAN AMB NON-IMAGING   Total time spent caring for the patient today was over 40 minutes. This includes time spent on the date of the visit reviewing the patient's chart before the visit, time spent during the visit, and time spent after the visit on documentation. Over 50% of that time was spent in face-to-face time with this patient for direct counseling. E&M based on time and complexity of medical decision making.  It has been explained that the patient is to follow regularly with their PCP in addition to all other providers involved in their care and to follow instructions provided by these respective offices. Patient advised to contact urology clinic if any urologic-pertaining questions, concerns, new symptoms or problems arise in the interim period.  There are no Patient Instructions on  file for this visit.  Electronically signed by:  Donnita Falls, FNP   05/15/23    3:24 PM

## 2023-05-15 ENCOUNTER — Encounter: Payer: Self-pay | Admitting: Urology

## 2023-05-15 ENCOUNTER — Ambulatory Visit: Payer: PPO | Admitting: Urology

## 2023-05-15 VITALS — BP 151/60 | HR 81

## 2023-05-15 DIAGNOSIS — N952 Postmenopausal atrophic vaginitis: Secondary | ICD-10-CM | POA: Diagnosis not present

## 2023-05-15 DIAGNOSIS — R32 Unspecified urinary incontinence: Secondary | ICD-10-CM | POA: Diagnosis not present

## 2023-05-15 DIAGNOSIS — G309 Alzheimer's disease, unspecified: Secondary | ICD-10-CM

## 2023-05-15 DIAGNOSIS — N819 Female genital prolapse, unspecified: Secondary | ICD-10-CM | POA: Diagnosis not present

## 2023-05-15 DIAGNOSIS — Z87440 Personal history of urinary (tract) infections: Secondary | ICD-10-CM

## 2023-05-15 DIAGNOSIS — R3129 Other microscopic hematuria: Secondary | ICD-10-CM

## 2023-05-15 LAB — BLADDER SCAN AMB NON-IMAGING: Scan Result: 11

## 2023-05-15 MED ORDER — ESTRADIOL 0.1 MG/GM VA CREA
TOPICAL_CREAM | VAGINAL | 3 refills | Status: DC
Start: 2023-05-15 — End: 2024-01-24

## 2023-05-15 NOTE — Progress Notes (Signed)
 Bladder Scan completed today.  Patient can void prior to the bladder scan. Bladder scan result: 11  Performed By: Alfonse Spruce. CMA  Additional notes-

## 2023-05-16 DIAGNOSIS — F339 Major depressive disorder, recurrent, unspecified: Secondary | ICD-10-CM | POA: Diagnosis not present

## 2023-05-16 DIAGNOSIS — I10 Essential (primary) hypertension: Secondary | ICD-10-CM | POA: Diagnosis not present

## 2023-05-16 DIAGNOSIS — R569 Unspecified convulsions: Secondary | ICD-10-CM | POA: Diagnosis not present

## 2023-05-16 DIAGNOSIS — F0280 Dementia in other diseases classified elsewhere without behavioral disturbance: Secondary | ICD-10-CM | POA: Diagnosis not present

## 2023-05-16 DIAGNOSIS — Z299 Encounter for prophylactic measures, unspecified: Secondary | ICD-10-CM | POA: Diagnosis not present

## 2023-05-16 DIAGNOSIS — E440 Moderate protein-calorie malnutrition: Secondary | ICD-10-CM | POA: Diagnosis not present

## 2023-05-16 LAB — MICROSCOPIC EXAMINATION

## 2023-05-16 LAB — URINALYSIS, ROUTINE W REFLEX MICROSCOPIC
Bilirubin, UA: NEGATIVE
Glucose, UA: NEGATIVE
Ketones, UA: NEGATIVE
Leukocytes,UA: NEGATIVE
Nitrite, UA: NEGATIVE
Protein,UA: NEGATIVE
Specific Gravity, UA: 1.02 (ref 1.005–1.030)
Urobilinogen, Ur: 0.2 mg/dL (ref 0.2–1.0)
pH, UA: 6 (ref 5.0–7.5)

## 2023-05-24 ENCOUNTER — Inpatient Hospital Stay: Payer: PPO | Attending: Hematology | Admitting: Hematology

## 2023-05-24 VITALS — BP 160/72 | HR 74 | Temp 96.7°F | Resp 16 | Wt 100.8 lb

## 2023-05-24 DIAGNOSIS — Z818 Family history of other mental and behavioral disorders: Secondary | ICD-10-CM | POA: Insufficient documentation

## 2023-05-24 DIAGNOSIS — R059 Cough, unspecified: Secondary | ICD-10-CM | POA: Diagnosis not present

## 2023-05-24 DIAGNOSIS — D509 Iron deficiency anemia, unspecified: Secondary | ICD-10-CM | POA: Insufficient documentation

## 2023-05-24 DIAGNOSIS — N1831 Chronic kidney disease, stage 3a: Secondary | ICD-10-CM | POA: Diagnosis not present

## 2023-05-24 DIAGNOSIS — F5089 Other specified eating disorder: Secondary | ICD-10-CM | POA: Diagnosis not present

## 2023-05-24 DIAGNOSIS — E538 Deficiency of other specified B group vitamins: Secondary | ICD-10-CM | POA: Insufficient documentation

## 2023-05-24 DIAGNOSIS — Z825 Family history of asthma and other chronic lower respiratory diseases: Secondary | ICD-10-CM | POA: Diagnosis not present

## 2023-05-24 DIAGNOSIS — Z803 Family history of malignant neoplasm of breast: Secondary | ICD-10-CM | POA: Diagnosis not present

## 2023-05-24 DIAGNOSIS — Z823 Family history of stroke: Secondary | ICD-10-CM | POA: Insufficient documentation

## 2023-05-24 DIAGNOSIS — F32A Depression, unspecified: Secondary | ICD-10-CM | POA: Diagnosis not present

## 2023-05-24 DIAGNOSIS — Z87440 Personal history of urinary (tract) infections: Secondary | ICD-10-CM | POA: Diagnosis not present

## 2023-05-24 DIAGNOSIS — Z79899 Other long term (current) drug therapy: Secondary | ICD-10-CM | POA: Diagnosis not present

## 2023-05-24 DIAGNOSIS — Z8249 Family history of ischemic heart disease and other diseases of the circulatory system: Secondary | ICD-10-CM | POA: Diagnosis not present

## 2023-05-24 DIAGNOSIS — D631 Anemia in chronic kidney disease: Secondary | ICD-10-CM | POA: Insufficient documentation

## 2023-05-24 DIAGNOSIS — D508 Other iron deficiency anemias: Secondary | ICD-10-CM

## 2023-05-24 DIAGNOSIS — Z9049 Acquired absence of other specified parts of digestive tract: Secondary | ICD-10-CM | POA: Insufficient documentation

## 2023-05-24 NOTE — Patient Instructions (Addendum)
 [facility] at [facility] Discharge Instructions   You were seen and examined today by Dr. Ellin Saba.  He reviewed the results of your lab work which are mostly normal/stable. Your iron is low as well as your B12, based on a test we did that indirectly lets [place] know your B12 is low. We will arrange for you to have an iron infusion and a B12 injection.   Start taking B12 1000 mcg tablet daily.   We will see you back in 3 months. We will repeat lab work prior to this visit.   Return as scheduled.    Thank you for choosing [facility] at [facility] to provide your oncology and hematology care.  To afford each patient quality time with our provider, please arrive at least 15 minutes before your scheduled appointment time.   If you have a lab appointment with the Cancer Center please come in thru the Main Entrance and check in at the main information desk.  You need to re-schedule your appointment should you arrive 10 or more minutes late.  We strive to give you quality time with our providers, and arriving late affects you and other patients whose appointments are after yours.  Also, if you no show three or more times for appointments you may be dismissed from the clinic at the providers discretion.     Again, thank you for choosing [facility]  Our hope is that these requests will decrease the amount of time that you wait before being seen by our physicians.       _____________________________________________________________  Should you have questions after your visit to [facility], please contact our office at [phone] and follow the prompts.  Our office hours are 8:00 a.m. and 4:30 p.m. Monday - Friday.  Please note that voicemails left after 4:00 p.m. may not be returned until the following business day.  We are closed weekends and major holidays.  You do have access to a nurse 24-7, just call the main number to  the clinic [phone] and do not press any options, hold on the line and a nurse will answer the phone.    For prescription refill requests, have your pharmacy contact our office and allow 72 hours.    Due to Covid, you will need to wear a mask upon entering the hospital. If you do not have a mask, a mask will be given to you at the Main Entrance upon arrival. For doctor visits, patients may have 1 support person age 23 or older with them. For treatment visits, patients can not have anyone with them due to social distancing guidelines and our immunocompromised population.

## 2023-05-24 NOTE — Progress Notes (Signed)
 [facility] [address], [place] [address]   Clinic Day:  05/24/2023  Referring physician: Kirstie Peri, MD  Patient Care Team: Kirstie Peri, MD as PCP - General (Internal Medicine)   ASSESSMENT & PLAN:   Assessment:  1.  Normocytic anemia: - Patient seen at the request of Dr. Wolfgang Phoenix. - Last labs in November 2024 showed hemoglobin 9.6. - She is taking iron tablet daily for the last few weeks.  Denies any BRBPR/melena.  Ice pica positive.  No prior transfusion history.  2.  CKD stage IIIa: - CKD since 2019, secondary to single functioning kidney.  CKD most likely secondary to her history of multiple UTIs/obstructive issues, hypertension, followed closely by Dr. Wolfgang Phoenix.  She has nonnephrotic range proteinuria.  3.  Social/family history: - Lives by herself at home.  She is accompanied by her sister today.  She is independent of ADLs and IADLs.  Non-smoker.  No chemical exposure.  No family history of anemia.  Sister had breast cancer and niece had pancreatic cancer.  Paternal uncle had breast cancer.  Plan:  1.  Normocytic anemia: - Anemia from CKD and of iron deficiency. - Reviewed labs from 05/03/2023: Hemoglobin 9.7.  Ferritin is 33 and percent saturation 6. - Recommend parenteral iron therapy with Monoferric 1 g IV x 1.  Discussed side effects in detail. - RTC 3 months for follow-up with repeat CBC, ferritin and iron panel.  2.  B12 deficiency: - B12 level is normal at 993.  However methylmalonic acid is elevated at 418. - Recommend B12 1 mg IM x 1.  Recommend her to start taking B12 1 mg tablet daily.  Will check B12 and MMA levels at next visit in 3 months.   No orders of the defined types were placed in this encounter.     Alben Deeds Teague,acting as a Neurosurgeon for Doreatha Massed, MD.,have documented all relevant documentation on the behalf of Doreatha Massed, MD,as directed by  Doreatha Massed, MD while in the presence of Doreatha Massed, MD.   I, Doreatha Massed MD, have reviewed the above documentation for accuracy and completeness, and I agree with the above.   Doreatha Massed, MD   3/19/20259:45 AM  CHIEF COMPLAINT/PURPOSE OF CONSULT:   Diagnosis: Normocytic anemia  Current Therapy: Monoferric and vitamin B12  HISTORY OF PRESENT ILLNESS:   Sheri Valencia is a 78 y.o. female seen for follow-up of anemia from CKD by Dr. Wolfgang Phoenix.  She denies any bleeding per rectum or melena.  Reports appetite of 100% and energy levels of 75%.  PAST MEDICAL HISTORY:   Past Medical History: Past Medical History:  Diagnosis Date   Depression    Memory loss    Seizure (HCC)    last seizure qwas over 1 year ago.    Surgical History: Past Surgical History:  Procedure Laterality Date   APPENDECTOMY     BIOPSY  08/16/2022   Procedure: BIOPSY;  Surgeon: Dolores Frame, MD;  [facility];  Service: Gastroenterology;;   COLONOSCOPY WITH PROPOFOL N/A 08/16/2022   Procedure: COLONOSCOPY WITH PROPOFOL;  Surgeon: Dolores Frame, MD;  [facility];  Service: Gastroenterology;  Laterality: N/A;  730am, asa 3   HEMOSTASIS CLIP PLACEMENT  08/16/2022   Procedure: HEMOSTASIS CLIP PLACEMENT;  Surgeon: Dolores Frame, MD;  [facility];  Service: Gastroenterology;;   POLYPECTOMY  08/16/2022   Procedure: POLYPECTOMY;  Surgeon: Dolores Frame, MD;  [facility];  Service: Gastroenterology;;   Susa Day  08/16/2022   Procedure: Susa Day;  Surgeon: Marguerita Merles, Reuel Boom, MD;  [facility];  Service: Gastroenterology;;   SUBMUCOSAL TATTOO INJECTION  08/16/2022   Procedure: SUBMUCOSAL TATTOO INJECTION;  Surgeon: Dolores Frame, MD;  [facility];  Service: Gastroenterology;;   TUBAL LIGATION      Social History: Social History   Socioeconomic History   Marital status: Widowed    Spouse name: Not on file    Number of children: 0   Years of education: college   Highest education level: Not on file  Occupational History    Employer: RETIRED    Comment: retired  Tobacco Use   Smoking status: Never    Passive exposure: Never   Smokeless tobacco: Never  Vaping Use   Vaping status: Never Used  Substance and Sexual Activity   Alcohol use: Yes    Alcohol/week: 1.0 standard drink of alcohol    Types: 1 Glasses of wine per week   Drug use: No   Sexual activity: Not Currently  Other Topics Concern   Not on file  Social History Narrative   Patient lives at home alone and she is widowed. Retired.   College education    Caffeine one cup daily.         Social Drivers of Corporate investment banker Strain: Not on file  Food Insecurity: No Food Insecurity (05/03/2023)   Hunger Vital Sign    Worried About Running Out of Food in the Last Year: Never true    Ran Out of Food in the Last Year: Never true  Transportation Needs: No Transportation Needs (05/03/2023)   PRAPARE - Administrator, Civil Service (Medical): No    Lack of Transportation (Non-Medical): No  Physical Activity: Not on file  Stress: Not on file  Social Connections: Unknown (07/19/2021)   [facility]   Social Network    Social Network: Not on file  Intimate Partner Violence: Not At Risk (05/03/2023)   Humiliation, Afraid, Rape, and Kick questionnaire    Fear of Current or Ex-Partner: No    Emotionally Abused: No    Physically Abused: No    Sexually Abused: No    Family History: Family History  Problem Relation Age of Onset   Pneumonia Mother    Heart attack Maternal Grandmother    Stroke Maternal Grandmother    Suicidality Brother    Aneurysm Maternal Aunt    Breast cancer Sister        04/2015  Stage I    Current Medications:  Current Outpatient Medications:    alendronate (FOSAMAX) 70 MG tablet, Take 70 mg by mouth every Friday., Disp: , Rfl:    aspirin EC 81 MG  tablet, Take 81 mg by mouth every 6 (six) hours as needed for moderate pain., Disp: , Rfl:    cloBAZam (ONFI) 2.5 MG/ML solution, TAKE 1 ML BY MOUTH AT BEDTIME., Disp: 30 mL, Rfl: 4   estradiol (ESTRACE) 0.1 MG/GM vaginal cream, Discard plastic applicator. Insert a blueberry size amount (approximately 1 gram) of cream on fingertip inside vagina every night at bedtime. For long term use., Disp: 30 g, Rfl: 3   FEROSUL 325 (65 Fe) MG tablet, Take by mouth., Disp: , Rfl:    LamoTRIgine (LAMICTAL XR) 25 MG TB24 24 hour tablet, Take 2 tablets (50 mg total) by mouth daily., Disp: 60 tablet, Rfl: 11  LamoTRIgine 250 MG TB24 24 hour tablet, Take 1 tablet (250 mg total) by mouth at bedtime., Disp: 90 tablet, Rfl: 4   memantine (NAMENDA) 10 MG tablet, TAKE 1 TABLET BY MOUTH TWICE DAILY., Disp: 180 tablet, Rfl: 1   rosuvastatin (CRESTOR) 5 MG tablet, Take 10 mg by mouth daily., Disp: , Rfl:    sertraline (ZOLOFT) 100 MG tablet, Take 100 mg by mouth daily., Disp: , Rfl:    Allergies: Allergies  Allergen Reactions   Lorazepam     confusion  Other Reaction(s): Mental Status Changes    REVIEW OF SYSTEMS:   Review of Systems  Constitutional:  Negative for chills and fever.  HENT:   Negative for lump/mass, mouth sores, nosebleeds, sore throat and trouble swallowing.   Eyes:  Negative for eye problems.  Respiratory:  Positive for cough. Negative for shortness of breath.   Cardiovascular:  Negative for chest pain, leg swelling and palpitations.  Gastrointestinal:  Negative for abdominal pain, constipation, diarrhea, nausea and vomiting.  Genitourinary:  Negative for bladder incontinence, difficulty urinating, dysuria, frequency, hematuria and nocturia.   Musculoskeletal:  Negative for arthralgias, back pain, flank pain, myalgias and neck pain.  Skin:  Negative for itching and rash.  Neurological:  Negative for headaches and numbness.  Hematological:  Does not bruise/bleed easily.   Psychiatric/Behavioral:  Positive for sleep disturbance. Negative for depression and suicidal ideas. The patient is nervous/anxious.   All other systems reviewed and are negative.    VITALS:   Blood pressure (!) 160/72, pulse 74, temperature (!) 96.7 F (35.9 C), temperature source Tympanic, resp. rate 16, weight 100 lb 12 oz (45.7 kg), SpO2 98%.  Wt Readings from Last 3 Encounters:  05/24/23 100 lb 12 oz (45.7 kg)  05/03/23 99 lb 10.4 oz (45.2 kg)  11/01/22 100 lb 8 oz (45.6 kg)    Body mass index is 19.78 kg/m.   PHYSICAL EXAM:   Physical Exam Vitals and nursing note reviewed. Exam conducted with a chaperone present.  Constitutional:      Appearance: Normal appearance.  Cardiovascular:     Rate and Rhythm: Normal rate and regular rhythm.     Pulses: Normal pulses.     Heart sounds: Normal heart sounds.  Pulmonary:     Effort: Pulmonary effort is normal.     Breath sounds: Normal breath sounds.  Abdominal:     Palpations: Abdomen is soft. There is no hepatomegaly, splenomegaly or mass.     Tenderness: There is no abdominal tenderness.  Musculoskeletal:     Right lower leg: No edema.     Left lower leg: No edema.  Lymphadenopathy:     Cervical: No cervical adenopathy.     Right cervical: No superficial, deep or posterior cervical adenopathy.    Left cervical: No superficial, deep or posterior cervical adenopathy.     Upper Body:     Right upper body: No supraclavicular or axillary adenopathy.     Left upper body: No supraclavicular or axillary adenopathy.  Neurological:     General: No focal deficit present.     Mental Status: She is alert and oriented to person, place, and time.  Psychiatric:        Mood and Affect: Mood normal.        Behavior: Behavior normal.     LABS:   CBC    Component Value Date/Time   WBC 11.2 (H) 05/03/2023 1409   RBC 3.78 (L) 05/03/2023 1409   RBC 3.87 05/03/2023  1409   HGB 9.7 (L) 05/03/2023 1409   HGB 11.4 12/04/2019 0849    HCT 31.2 (L) 05/03/2023 1409   HCT 34.5 12/04/2019 0849   PLT 263 05/03/2023 1409   PLT 307 12/04/2019 0849   MCV 80.6 05/03/2023 1409   MCV 87 12/04/2019 0849   MCH 25.1 (L) 05/03/2023 1409   MCHC 31.1 05/03/2023 1409   RDW 16.6 (H) 05/03/2023 1409   RDW 12.7 12/04/2019 0849   LYMPHSABS 1.6 05/03/2023 1409   LYMPHSABS 1.3 12/04/2019 0849   MONOABS 1.0 05/03/2023 1409   EOSABS 0.3 05/03/2023 1409   EOSABS 0.1 12/04/2019 0849   BASOSABS 0.1 05/03/2023 1409   BASOSABS 0.1 12/04/2019 0849    CMP    Component Value Date/Time   NA 136 05/03/2023 1409   NA 137 12/04/2019 0849   K 4.4 05/03/2023 1409   CL 102 05/03/2023 1409   CO2 24 05/03/2023 1409   GLUCOSE 99 05/03/2023 1409   BUN 16 05/03/2023 1409   BUN 12 12/04/2019 0849   CREATININE 1.13 (H) 05/03/2023 1409   CALCIUM 9.0 05/03/2023 1409   PROT 7.1 05/03/2023 1409   PROT 7.1 12/04/2019 0849   ALBUMIN 3.5 05/03/2023 1409   ALBUMIN 4.3 12/04/2019 0849   AST 23 05/03/2023 1409   ALT 17 05/03/2023 1409   ALKPHOS 82 05/03/2023 1409   BILITOT 0.3 05/03/2023 1409   BILITOT 0.2 12/04/2019 0849   GFRNONAA 51 (L) 05/03/2023 1409   GFRAA 61 12/04/2019 0849    No results found for: "CEA1", "CEA" / No results found for: "CEA1", "CEA" No results found for: "PSA1" No results found for: "CAN199" No results found for: "CAN125"  Lab Results  Component Value Date   TOTALPROTELP 6.6 05/03/2023   TOTALPROTELP 6.7 05/03/2023   ALBUMINELP 3.3 05/03/2023   A1GS 0.4 05/03/2023   A2GS 0.9 05/03/2023   BETS 1.3 05/03/2023   GAMS 0.7 05/03/2023   MSPIKE Not Observed 05/03/2023   SPEI Comment 05/03/2023   Lab Results  Component Value Date   TIBC 357 05/03/2023   FERRITIN 33 05/03/2023   IRONPCTSAT 6 (L) 05/03/2023   Lab Results  Component Value Date   LDH 159 05/03/2023     STUDIES:   No results found.

## 2023-05-26 ENCOUNTER — Inpatient Hospital Stay

## 2023-05-26 VITALS — BP 155/72 | HR 65 | Temp 98.0°F | Resp 18

## 2023-05-26 DIAGNOSIS — D509 Iron deficiency anemia, unspecified: Secondary | ICD-10-CM

## 2023-05-26 MED ORDER — CETIRIZINE HCL 10 MG/ML IV SOLN
10.0000 mg | Freq: Once | INTRAVENOUS | Status: AC
  Administered 2023-05-26: 10 mg via INTRAVENOUS
  Filled 2023-05-26: qty 1

## 2023-05-26 MED ORDER — ACETAMINOPHEN 325 MG PO TABS
650.0000 mg | ORAL_TABLET | Freq: Once | ORAL | Status: AC
  Administered 2023-05-26: 650 mg via ORAL
  Filled 2023-05-26: qty 2

## 2023-05-26 MED ORDER — SODIUM CHLORIDE 0.9 % IV SOLN: INTRAVENOUS | Status: DC

## 2023-05-26 MED ORDER — CYANOCOBALAMIN 1000 MCG/ML IJ SOLN
1000.0000 ug | Freq: Once | INTRAMUSCULAR | Status: AC
  Administered 2023-05-26: 1000 ug via INTRAMUSCULAR
  Filled 2023-05-26: qty 1

## 2023-05-26 MED ORDER — SODIUM CHLORIDE 0.9 % IV SOLN
1000.0000 mg | Freq: Once | INTRAVENOUS | Status: AC
  Administered 2023-05-26: 1000 mg via INTRAVENOUS
  Filled 2023-05-26: qty 1000

## 2023-05-26 NOTE — Progress Notes (Signed)
Patient presents today for iron infusion. Patient is in satisfactory condition with no new complaints voiced.  Vital signs are stable.  We will proceed with infusion per provider orders.    Peripheral IV started with good blood return pre and post infusion.  Monoferric 1,000 mg given today per MD orders. Tolerated infusion without adverse affects. Vital signs stable. No complaints at this time. Discharged from clinic ambulatory in stable condition. Alert and oriented x 3. F/U with [facility] as scheduled.

## 2023-05-26 NOTE — Patient Instructions (Signed)
 [facility] [place] - A DEPT OF [facility]  Discharge Instructions: Thank you for choosing [facility] to provide your oncology and hematology care.  If you have a lab appointment with the Cancer Center - please note that after April 8th, 2024, all labs will be drawn in the cancer center.  You do not have to check in or register with the main entrance as you have in the past but will complete your check-in in the cancer center.  Wear comfortable clothing and clothing appropriate for easy access to any Portacath or PICC line.   We strive to give you quality time with your provider. You may need to reschedule your appointment if you arrive late (15 or more minutes).  Arriving late affects you and other patients whose appointments are after yours.  Also, if you miss three or more appointments without notifying the office, you may be dismissed from the clinic at the provider's discretion.      For prescription refill requests, have your pharmacy contact our office and allow 72 hours for refills to be completed.    Today you received Monoferric IV iron infusion.     BELOW ARE SYMPTOMS THAT SHOULD BE REPORTED IMMEDIATELY: *FEVER GREATER THAN 100.4 F (38 C) OR HIGHER *CHILLS OR SWEATING *NAUSEA AND VOMITING THAT IS NOT CONTROLLED WITH YOUR NAUSEA MEDICATION *UNUSUAL SHORTNESS OF BREATH *UNUSUAL BRUISING OR BLEEDING *URINARY PROBLEMS (pain or burning when urinating, or frequent urination) *BOWEL PROBLEMS (unusual diarrhea, constipation, pain near the anus) TENDERNESS IN MOUTH AND THROAT WITH OR WITHOUT PRESENCE OF ULCERS (sore throat, sores in mouth, or a toothache) UNUSUAL RASH, SWELLING OR PAIN  UNUSUAL VAGINAL DISCHARGE OR ITCHING   Items with * indicate a potential emergency and should be followed up as soon as possible or go to the Emergency Department if any problems should occur.  Please show the CHEMOTHERAPY ALERT CARD or IMMUNOTHERAPY ALERT CARD  at check-in to the Emergency Department and triage nurse.  Should you have questions after your visit or need to cancel or reschedule your appointment, please contact [facility] [place] - A DEPT OF Eligha Bridegroom [facility] [phone]  and follow the prompts.  Office hours are 8:00 a.m. to 4:30 p.m. Monday - Friday. Please note that voicemails left after 4:00 p.m. may not be returned until the following business day.  We are closed weekends and major holidays. You have access to a nurse at all times for urgent questions. Please call the main number to the clinic [phone] and follow the prompts.  For any non-urgent questions, you may also contact your provider using MyChart. We now offer e-Visits for anyone 35 and older to request care online for non-urgent symptoms. For details visit [email or web address].   Also download the MyChart app! Go to the app store, search "MyChart", open the app, select [place], and log in with your MyChart username and password.

## 2023-05-26 NOTE — Progress Notes (Signed)
 Sheri Valencia presents today for injection per the provider's orders.  B12 administration without incident; injection site WNL; see MAR for injection details.  Patient tolerated procedure well and without incident.  No questions or complaints noted at this time.   Discharged from clinic ambulatory in stable condition. Alert and oriented x 3. F/U with [facility] as scheduled.

## 2023-05-30 DIAGNOSIS — I10 Essential (primary) hypertension: Secondary | ICD-10-CM | POA: Diagnosis not present

## 2023-05-30 DIAGNOSIS — F0280 Dementia in other diseases classified elsewhere without behavioral disturbance: Secondary | ICD-10-CM | POA: Diagnosis not present

## 2023-05-30 DIAGNOSIS — Z299 Encounter for prophylactic measures, unspecified: Secondary | ICD-10-CM | POA: Diagnosis not present

## 2023-05-30 DIAGNOSIS — G309 Alzheimer's disease, unspecified: Secondary | ICD-10-CM | POA: Diagnosis not present

## 2023-05-31 ENCOUNTER — Ambulatory Visit (HOSPITAL_COMMUNITY)

## 2023-06-28 DIAGNOSIS — N1831 Chronic kidney disease, stage 3a: Secondary | ICD-10-CM | POA: Diagnosis not present

## 2023-06-28 DIAGNOSIS — R569 Unspecified convulsions: Secondary | ICD-10-CM | POA: Diagnosis not present

## 2023-06-28 DIAGNOSIS — Z299 Encounter for prophylactic measures, unspecified: Secondary | ICD-10-CM | POA: Diagnosis not present

## 2023-06-28 DIAGNOSIS — F0280 Dementia in other diseases classified elsewhere without behavioral disturbance: Secondary | ICD-10-CM | POA: Diagnosis not present

## 2023-06-28 DIAGNOSIS — N390 Urinary tract infection, site not specified: Secondary | ICD-10-CM | POA: Diagnosis not present

## 2023-06-28 DIAGNOSIS — I10 Essential (primary) hypertension: Secondary | ICD-10-CM | POA: Diagnosis not present

## 2023-06-28 DIAGNOSIS — R32 Unspecified urinary incontinence: Secondary | ICD-10-CM | POA: Diagnosis not present

## 2023-07-05 ENCOUNTER — Other Ambulatory Visit: Admitting: Urology

## 2023-07-14 ENCOUNTER — Encounter (INDEPENDENT_AMBULATORY_CARE_PROVIDER_SITE_OTHER): Payer: Self-pay | Admitting: *Deleted

## 2023-07-25 DIAGNOSIS — R809 Proteinuria, unspecified: Secondary | ICD-10-CM | POA: Diagnosis not present

## 2023-07-25 DIAGNOSIS — N189 Chronic kidney disease, unspecified: Secondary | ICD-10-CM | POA: Diagnosis not present

## 2023-07-25 DIAGNOSIS — D631 Anemia in chronic kidney disease: Secondary | ICD-10-CM | POA: Diagnosis not present

## 2023-07-25 DIAGNOSIS — D649 Anemia, unspecified: Secondary | ICD-10-CM | POA: Diagnosis not present

## 2023-07-27 ENCOUNTER — Other Ambulatory Visit: Payer: Self-pay | Admitting: Neurology

## 2023-07-27 NOTE — Telephone Encounter (Signed)
 Requested Prescriptions   Pending Prescriptions Disp Refills   cloBAZam  (ONFI ) 2.5 MG/ML solution [Pharmacy Med Name: CLOBAZAM  2.5 MG/ML SUSPENSION] 30 mL 4    Sig: TAKE 1 ML BY MOUTH AT BEDTIME.   Last Seen: 11/01/22  Next Appt: 10/31/23  Last Note Stated:  Continue Lamictal  XR 50 mg a.m., 250 mg p.m., Onfi  2.5 mg at bedtime (taper off Keppra  June 2023)    Dispenses    Dispensed Days Supply Quantity Provider Pharmacy  CLOBAZAM  2.5 MG/ML SUSPENSION 03/27/2023 120 120 mL Wess Hammed, NP [facility] - E...  CLOBAZAM  2.5 MG/ML SUSPENSION 12/07/2022 28 28 mL Wess Hammed, NP [facility] - E...  CLOBAZAM  2.5 MG/ML SUSPENSION 11/01/2022 28 28 mL Wess Hammed, NP [facility] - E...  cloBAZam  (ONFI ) oral suspension 2.5 mg/mL 11/01/2022 90 90 Slack, Verita Glassman, NP [facility] - [facility]

## 2023-07-28 ENCOUNTER — Telehealth: Payer: Self-pay

## 2023-07-28 NOTE — Telephone Encounter (Signed)
 Return  call to patient. Patient had questions regarding what to expect for CT . Patient is made aware to not have anything to eat 4 hours prior and only clear liquids and to arrive 15 mins prior. Patient voiced understanding.

## 2023-08-01 ENCOUNTER — Ambulatory Visit (HOSPITAL_COMMUNITY)
Admission: RE | Admit: 2023-08-01 | Discharge: 2023-08-01 | Disposition: A | Discharge status: Home / Self Care | Source: Ambulatory Visit | Attending: Urology | Admitting: Urology

## 2023-08-01 ENCOUNTER — Telehealth: Payer: Self-pay | Admitting: Neurology

## 2023-08-01 DIAGNOSIS — Z87440 Personal history of urinary (tract) infections: Secondary | ICD-10-CM | POA: Insufficient documentation

## 2023-08-01 DIAGNOSIS — R3129 Other microscopic hematuria: Secondary | ICD-10-CM | POA: Diagnosis not present

## 2023-08-01 MED ORDER — IOHEXOL 350 MG/ML SOLN
80.0000 mL | Freq: Once | INTRAVENOUS | Status: AC | PRN
  Administered 2023-08-01: 80 mL via INTRAVENOUS

## 2023-08-01 MED ORDER — IOHEXOL 300 MG/ML  SOLN
100.0000 mL | Freq: Once | INTRAMUSCULAR | Status: DC | PRN
Start: 2023-08-01 — End: 2023-08-01

## 2023-08-01 MED ORDER — CLOBAZAM 2.5 MG/ML PO SUSP: 2.5000 mg | Freq: Every evening | ORAL | 0 refills | Status: DC

## 2023-08-01 NOTE — Telephone Encounter (Signed)
 Meds ordered this encounter  Medications   cloBAZam  (ONFI ) 2.5 MG/ML solution    Sig: Take 1 mL (2.5 mg total) by mouth at bedtime.    Dispense:  120 mL    Refill:  0

## 2023-08-01 NOTE — Telephone Encounter (Signed)
 Returned call to [facility]:  They need the ordered  Requested Prescriptions   Pending Prescriptions Disp Refills   cloBAZam  (ONFI ) 2.5 MG/ML solution 120 mL 1    Sig: Take 1 mL (2.5 mg total) by mouth at bedtime.   Last seen 11/01/23, next appt 10/31/23  Dispenses   Dispensed Days Supply Quantity Provider Pharmacy  CLOBAZAM  2.5 MG/ML SUSPENSION 03/27/2023 120 120 mL Slack, Sarah J, NP [facility] - E...  CLOBAZAM  2.5 MG/ML SUSPENSION 12/07/2022 28 28 mL Wess Hammed, NP [facility] - E...  CLOBAZAM  2.5 MG/ML SUSPENSION 11/01/2022 28 28 mL Wess Hammed, NP [facility] - E...  cloBAZam  (ONFI ) oral suspension 2.5 mg/mL 11/01/2022 90 90 Wess Hammed, NP [facility] - E...    The pharmacy stated that they need it written as 4 month supply because they keep having to waste what is left in bottle that hasn't been getting shipped to pt

## 2023-08-01 NOTE — Telephone Encounter (Signed)
 Pharmacist Berta Brittle is asking for a call to discuss how  cloBAZam  (ONFI ) 2.5 MG/ML solution can be dispensed for pt, please call.

## 2023-08-02 DIAGNOSIS — N1831 Chronic kidney disease, stage 3a: Secondary | ICD-10-CM | POA: Diagnosis not present

## 2023-08-02 DIAGNOSIS — I129 Hypertensive chronic kidney disease with stage 1 through stage 4 chronic kidney disease, or unspecified chronic kidney disease: Secondary | ICD-10-CM | POA: Diagnosis not present

## 2023-08-02 DIAGNOSIS — D638 Anemia in other chronic diseases classified elsewhere: Secondary | ICD-10-CM | POA: Diagnosis not present

## 2023-08-02 DIAGNOSIS — R809 Proteinuria, unspecified: Secondary | ICD-10-CM | POA: Diagnosis not present

## 2023-08-04 LAB — POCT I-STAT CREATININE: Creatinine, Ser: 1.2 mg/dL — ABNORMAL HIGH (ref 0.44–1.00)

## 2023-08-07 ENCOUNTER — Ambulatory Visit: Payer: Self-pay | Admitting: Urology

## 2023-08-09 NOTE — Telephone Encounter (Signed)
-----   Message from Lauretta Ponto sent at 08/07/2023  3:26 PM EDT ----- Please notify patient: CT showed no acute findings; nothing to explain hematuria. Follow up for cystoscopy as planned for further evaluation. Thanks.

## 2023-08-09 NOTE — Telephone Encounter (Signed)
 Patient is made aware and voiced understanding.

## 2023-08-14 ENCOUNTER — Ambulatory Visit: Admitting: Urology

## 2023-08-14 ENCOUNTER — Encounter: Payer: Self-pay | Admitting: Urology

## 2023-08-14 VITALS — BP 162/68 | HR 72

## 2023-08-14 DIAGNOSIS — R32 Unspecified urinary incontinence: Secondary | ICD-10-CM | POA: Diagnosis not present

## 2023-08-14 DIAGNOSIS — R319 Hematuria, unspecified: Secondary | ICD-10-CM

## 2023-08-14 LAB — URINALYSIS, ROUTINE W REFLEX MICROSCOPIC
Bilirubin, UA: NEGATIVE
Glucose, UA: NEGATIVE
Ketones, UA: NEGATIVE
Nitrite, UA: NEGATIVE
Protein,UA: NEGATIVE
Specific Gravity, UA: 1.02 (ref 1.005–1.030)
Urobilinogen, Ur: 0.2 mg/dL (ref 0.2–1.0)
pH, UA: 6 (ref 5.0–7.5)

## 2023-08-14 LAB — MICROSCOPIC EXAMINATION
Epithelial Cells (non renal): >10 /HPF — AB (ref 0–10)
RBC, Urine: >30 /HPF — AB (ref 0–2)
WBC, UA: >30 /HPF — AB (ref 0–5)

## 2023-08-14 MED ORDER — CEPHALEXIN 500 MG PO CAPS
500.0000 mg | ORAL_CAPSULE | Freq: Once | ORAL | Status: AC
  Administered 2023-08-14: 500 mg via ORAL

## 2023-08-14 NOTE — Patient Instructions (Signed)
 Blood in the Pee (Hematuria) in Adults: What to Know  Hematuria is blood in the pee. You may be able to see blood in the pee. In some cases, a health care provider may find blood with a test.  Blood in the pee can be caused by infections of the kidney, bladder, or the urethra. The urethra is the tube that drains pee from the bladder.  Other causes may include: Kidney stones. Infection of the prostate. Cancer. Too much calcium in the pee. Conditions that are passed from parent to child. Too much exercise. Infections can be treated with medicine. A kidney stone will usually leave your body when you pee. If infections or kidney stones didn't cause the blood in the urine, then more tests may be needed. It is very important to tell your provider about any blood in your pee, even if you have no pain or the blood stops with no treatment. Blood in the pee can be a sign of a very serious problem, such as cancer. Follow these instructions at home: Medicines Take your medicines only as told. If you were given antibiotics, take them as told. Do not stop taking them even if you start to feel better. Eating and drinking Drink more fluids as told. Aim to drink 3-4 quarts (2.8-3.8 L) a day. Avoid caffeine, tea, and carbonated drinks. These can bother the bladder. Avoid alcohol if a female because it may irritate the prostate. General instructions If you have been diagnosed with a kidney stone, strain your pee to catch the stone if told by your provider. Empty your bladder often. Avoid holding pee for a long time. If you're female, make sure that: You wipe from front to back after using the bathroom. You use each piece of toilet paper only once. You pee before and after sex. It's up to you to get the results of any tests. Ask when your results will be ready and how to get them. You may need to call or meet with your provider to get your results. Keep all follow-up visits. Your provider will need to know  about any changes or any new symptoms. Contact a health care provider if: Your symptoms don't get better after 3 days. Your symptoms get worse. You have back pain or belly pain. You have a fever or chills. You throw up or feel like you may throw up. You throw up every time you take medicine. Get help right away if: You pass blood clots in your pee. You pass out. These symptoms may be an emergency. Call 911 right away. Do not wait to see if the symptoms will go away. Do not drive yourself to the hospital. This information is not intended to replace advice given to you by your health care provider. Make sure you discuss any questions you have with your health care provider. Document Revised: 12/08/2022 Document Reviewed: 11/17/2022 Elsevier Patient Education  2024 [employer].

## 2023-08-14 NOTE — Progress Notes (Signed)
   08/14/23  NG:[id number]   HPI: Ms Detwiler is a 78yo here for followup for microhematuria. CT hematuria protocol showed no GU tumors and no calculi. She has an Atropic left kidney Blood pressure (!) 162/68, pulse 72. NED. A&Ox3.   No respiratory distress   Abd soft, NT, ND Normal external genitalia with patent urethral meatus  Cystoscopy Procedure Note  Patient identification was confirmed, informed consent was obtained, and patient was prepped using Betadine solution.  Lidocaine jelly was administered per urethral meatus.    Procedure: - Flexible cystoscope introduced, without any difficulty.   - Thorough search of the bladder revealed:    normal urethral meatus    normal urothelium    no stones    no ulcers     no tumors    no urethral polyps    no trabeculation  - Ureteral orifices were normal in position and appearance.  Post-Procedure: - Patient tolerated the procedure well  Assessment/ Plan: Hematuria workup negative. Followup in 6 months with a UA   No follow-ups on file.  Johnie Nailer, MD

## 2023-08-15 DIAGNOSIS — D631 Anemia in chronic kidney disease: Secondary | ICD-10-CM | POA: Diagnosis not present

## 2023-08-15 DIAGNOSIS — N183 Chronic kidney disease, stage 3 unspecified: Secondary | ICD-10-CM | POA: Diagnosis not present

## 2023-08-15 DIAGNOSIS — N189 Chronic kidney disease, unspecified: Secondary | ICD-10-CM | POA: Diagnosis not present

## 2023-08-25 ENCOUNTER — Inpatient Hospital Stay: Attending: Hematology

## 2023-08-25 DIAGNOSIS — Z825 Family history of asthma and other chronic lower respiratory diseases: Secondary | ICD-10-CM | POA: Insufficient documentation

## 2023-08-25 DIAGNOSIS — N1831 Chronic kidney disease, stage 3a: Secondary | ICD-10-CM | POA: Diagnosis not present

## 2023-08-25 DIAGNOSIS — D508 Other iron deficiency anemias: Secondary | ICD-10-CM

## 2023-08-25 DIAGNOSIS — E538 Deficiency of other specified B group vitamins: Secondary | ICD-10-CM | POA: Insufficient documentation

## 2023-08-25 DIAGNOSIS — D509 Iron deficiency anemia, unspecified: Secondary | ICD-10-CM | POA: Diagnosis not present

## 2023-08-25 DIAGNOSIS — Z9049 Acquired absence of other specified parts of digestive tract: Secondary | ICD-10-CM | POA: Insufficient documentation

## 2023-08-25 DIAGNOSIS — F32A Depression, unspecified: Secondary | ICD-10-CM | POA: Insufficient documentation

## 2023-08-25 DIAGNOSIS — Z87440 Personal history of urinary (tract) infections: Secondary | ICD-10-CM | POA: Diagnosis not present

## 2023-08-25 DIAGNOSIS — I129 Hypertensive chronic kidney disease with stage 1 through stage 4 chronic kidney disease, or unspecified chronic kidney disease: Secondary | ICD-10-CM | POA: Diagnosis not present

## 2023-08-25 DIAGNOSIS — Z8249 Family history of ischemic heart disease and other diseases of the circulatory system: Secondary | ICD-10-CM | POA: Insufficient documentation

## 2023-08-25 DIAGNOSIS — Z823 Family history of stroke: Secondary | ICD-10-CM | POA: Insufficient documentation

## 2023-08-25 DIAGNOSIS — Z803 Family history of malignant neoplasm of breast: Secondary | ICD-10-CM | POA: Insufficient documentation

## 2023-08-25 DIAGNOSIS — Z79899 Other long term (current) drug therapy: Secondary | ICD-10-CM | POA: Diagnosis not present

## 2023-08-25 DIAGNOSIS — F5089 Other specified eating disorder: Secondary | ICD-10-CM | POA: Diagnosis not present

## 2023-08-25 LAB — CBC
HCT: 35.7 % — ABNORMAL LOW (ref 36.0–46.0)
Hemoglobin: 11.5 g/dL — ABNORMAL LOW (ref 12.0–15.0)
MCH: 28.8 pg (ref 26.0–34.0)
MCHC: 32.2 g/dL (ref 30.0–36.0)
MCV: 89.3 fL (ref 80.0–100.0)
Platelets: 262 10*3/uL (ref 150–400)
RBC: 4 MIL/uL (ref 3.87–5.11)
RDW: 14.2 % (ref 11.5–15.5)
WBC: 9.6 10*3/uL (ref 4.0–10.5)
nRBC: 0 % (ref 0.0–0.2)

## 2023-08-25 LAB — IRON AND TIBC
Iron: 108 ug/dL (ref 28–170)
Saturation Ratios: 42 % — ABNORMAL HIGH (ref 10.4–31.8)
TIBC: 257 ug/dL (ref 250–450)
UIBC: 149 ug/dL

## 2023-08-25 LAB — VITAMIN B12: Vitamin B-12: 1086 pg/mL — ABNORMAL HIGH (ref 180–914)

## 2023-08-25 LAB — FERRITIN: Ferritin: 322 ng/mL — ABNORMAL HIGH (ref 11–307)

## 2023-08-28 ENCOUNTER — Other Ambulatory Visit: Payer: Self-pay | Admitting: Neurology

## 2023-08-28 LAB — METHYLMALONIC ACID, SERUM: Methylmalonic Acid, Quantitative: 265 nmol/L (ref 0–378)

## 2023-09-01 ENCOUNTER — Inpatient Hospital Stay: Admitting: Oncology

## 2023-09-01 DIAGNOSIS — D509 Iron deficiency anemia, unspecified: Secondary | ICD-10-CM | POA: Diagnosis not present

## 2023-09-01 DIAGNOSIS — D508 Other iron deficiency anemias: Secondary | ICD-10-CM

## 2023-09-01 DIAGNOSIS — E538 Deficiency of other specified B group vitamins: Secondary | ICD-10-CM | POA: Diagnosis not present

## 2023-09-01 MED ORDER — FERROUS GLUCONATE 324 (38 FE) MG PO TABS: 324.0000 mg | ORAL_TABLET | ORAL | 3 refills | Status: DC

## 2023-09-01 NOTE — Patient Instructions (Signed)
 Start ferrous gluconate 324 mg  daily or every other day.  Take iron with vitamin C tablets or OJ.  Continue B12 tablets daily.

## 2023-09-01 NOTE — Progress Notes (Signed)
 [facility] [address] [place] [address]   Clinic Day:  09/01/2023  Referring physician: [name], MD  Patient Care Team: [name], MD as PCP - General (Internal Medicine)   ASSESSMENT & PLAN:   Assessment:  1.  Normocytic anemia: - Patient seen at the request of Dr. Rachele. - Last labs in November 2024 showed hemoglobin 9.6. - She is taking iron tablet daily for the last few weeks.  Denies any BRBPR/melena.  Ice pica positive.  No prior transfusion history.  2.  CKD stage IIIa: - CKD since 2019, secondary to single functioning kidney.  CKD most likely secondary to her history of multiple UTIs/obstructive issues, hypertension, followed closely by Dr. Rachele.  She has nonnephrotic range proteinuria.  3.  Social/family history: - Lives by herself at home.  She is accompanied by her sister today.  She is independent of ADLs and IADLs.  Non-smoker.  No chemical exposure.  No family history of anemia.  Sister had breast cancer and niece had pancreatic cancer.  Paternal uncle had breast cancer.  Plan:  1.  Normocytic anemia: - Anemia from CKD and of iron deficiency. - Reviewed labs from 08/25/2023: Hemoglobin 11.5 (9.7).  Ferritin is 322 and percent saturation 42. - She received 1 dose of Monoferric  on 05/26/2023 and B12 injection. -She tolerated infusion well. -We discussed starting ferrous gluconate 324 mg tablets every other day along with orange juice or vitamin C tablets.  If she is unable to tolerate, she may stop. - RTC 3 months for follow-up with repeat CBC, ferritin and iron panel.  2.  B12 deficiency: - B12 level is normal at 993.  However methylmalonic acid is elevated at 418. - She received 1 B12 shot on 05/26/2023.   -Repeat B12 levels from 08/25/2023 showed a vitamin B12 level of 1086 and an MMA of 265. -I would recommend continuing B12 1 mg tablets daily but she does not need any additional B12 injections.    Orders Placed This  Encounter  Procedures   Vitamin B12    Standing Status:   Future    Expected Date:   12/02/2023    Expiration Date:   08/31/2024   Methylmalonic acid, serum    Standing Status:   Future    Expected Date:   12/02/2023    Expiration Date:   08/31/2024   Iron and TIBC ([facility] ONLY)    Standing Status:   Future    Expected Date:   12/02/2023    Expiration Date:   08/31/2024   Ferritin    Standing Status:   Future    Expected Date:   12/02/2023    Expiration Date:   08/31/2024   CBC    Standing Status:   Future    Expected Date:   12/02/2023    Expiration Date:   08/31/2024    Delon FORBES Hope, NP   6/27/202510:15 AM  CHIEF COMPLAINT/PURPOSE OF CONSULT:   Diagnosis: Normocytic anemia  Current Therapy: Monoferric  and vitamin B12  HISTORY OF PRESENT ILLNESS:   Sheri Valencia is a 78 y.o. female seen for follow-up of anemia from CKD by Dr. Rachele.  She denies any bleeding per rectum or melena.  Reports appetite of 100% and energy levels of 0%.  Reports over the past couple of months she has felt more tired and she is not sure if it is due to the humidity outside or something else.  Has occasional dizziness especially when she  stands.  Reports that she has been drinking plenty of fluids.  She does not nap during the day.  She does have trouble staying asleep at night which could be contributing some to her fatigue.  Reports she used to be more active than she is now she is due to get back to that. No surgeries or changes to her baseline health.  Denies any pain.    She has been taking B12 1 mg daily.  She has tried iron in the past but has not been taking it because she has been forgetting.  Does not think she had any abdominal concerns when she took it.  PAST MEDICAL HISTORY:   Past Medical History: Past Medical History:  Diagnosis Date   Depression    Memory loss    Seizure (HCC)    last seizure qwas over 1 year ago.    Surgical History: Past Surgical History:   Procedure Laterality Date   APPENDECTOMY     BIOPSY  08/16/2022   Procedure: BIOPSY;  Surgeon: Eartha Angelia Sieving, MD;  [facility];  Service: Gastroenterology;;   COLONOSCOPY WITH PROPOFOL  N/A 08/16/2022   Procedure: COLONOSCOPY WITH PROPOFOL ;  Surgeon: Eartha Angelia Sieving, MD;  [facility];  Service: Gastroenterology;  Laterality: N/A;  730am, asa 3   HEMOSTASIS CLIP PLACEMENT  08/16/2022   Procedure: HEMOSTASIS CLIP PLACEMENT;  Surgeon: Eartha Angelia Sieving, MD;  [facility];  Service: Gastroenterology;;   POLYPECTOMY  08/16/2022   Procedure: POLYPECTOMY;  Surgeon: Eartha Angelia Sieving, MD;  [facility];  Service: Gastroenterology;;   MATIAS  08/16/2022   Procedure: MATIAS;  Surgeon: Eartha Angelia, Sieving, MD;  [facility];  Service: Gastroenterology;;   SUBMUCOSAL TATTOO INJECTION  08/16/2022   Procedure: SUBMUCOSAL TATTOO INJECTION;  Surgeon: Eartha Angelia Sieving, MD;  [facility];  Service: Gastroenterology;;   TUBAL LIGATION      Social History: Social History   Socioeconomic History   Marital status: Widowed    Spouse name: Not on file   Number of children: 0   Years of education: college   Highest education level: Not on file  Occupational History    Employer: RETIRED    Comment: retired  Tobacco Use   Smoking status: Never    Passive exposure: Never   Smokeless tobacco: Never  Vaping Use   Vaping status: Never Used  Substance and Sexual Activity   Alcohol  use: Yes    Alcohol /week: 1.0 standard drink of alcohol     Types: 1 Glasses of wine per week   Drug use: No   Sexual activity: Not Currently  Other Topics Concern   Not on file  Social History Narrative   Patient lives at home alone and she is widowed. Retired.   College education    Caffeine one cup daily.         Social Drivers of Corporate investment banker Strain: Not on file  Food  Insecurity: No Food Insecurity (05/03/2023)   Hunger Vital Sign    Worried About Running Out of Food in the Last Year: Never true    Ran Out of Food in the Last Year: Never true  Transportation Needs: No Transportation Needs (05/03/2023)   PRAPARE - Administrator, Civil Service (Medical): No    Lack of Transportation (Non-Medical): No  Physical Activity: Not on file  Stress: Not on file  Social Connections: Unknown (07/19/2021)   Received from  [facility]   Social Network    Social Network: Not on file  Intimate Partner Violence: Not At Risk (05/03/2023)   Humiliation, Afraid, Rape, and Kick questionnaire    Fear of Current or Ex-Partner: No    Emotionally Abused: No    Physically Abused: No    Sexually Abused: No    Family History: Family History  Problem Relation Age of Onset   Pneumonia Mother    Heart attack Maternal Grandmother    Stroke Maternal Grandmother    Suicidality Brother    Aneurysm Maternal Aunt    Breast cancer Sister        04/2015  Stage I    Current Medications:  Current Outpatient Medications:    losartan (COZAAR) 50 MG tablet, Take 50 mg by mouth daily., Disp: , Rfl:    alendronate (FOSAMAX) 70 MG tablet, Take 70 mg by mouth every Friday., Disp: , Rfl:    aspirin EC 81 MG tablet, Take 81 mg by mouth every 6 (six) hours as needed for moderate pain., Disp: , Rfl:    cloBAZam  (ONFI ) 2.5 MG/ML solution, Take 1 mL (2.5 mg total) by mouth at bedtime., Disp: 120 mL, Rfl: 0   estradiol  (ESTRACE ) 0.1 MG/GM vaginal cream, Discard plastic applicator. Insert a blueberry size amount (approximately 1 gram) of cream on fingertip inside vagina every night at bedtime. For long term use., Disp: 30 g, Rfl: 3   FEROSUL 325 (65 Fe) MG tablet, Take by mouth., Disp: , Rfl:    LamoTRIgine  (LAMICTAL  XR) 25 MG TB24 24 hour tablet, Take 2 tablets (50 mg total) by mouth daily., Disp: 60 tablet, Rfl: 11   LamoTRIgine  250 MG TB24 24 hour tablet, Take 1 tablet (250 mg  total) by mouth at bedtime., Disp: 90 tablet, Rfl: 4   memantine  (NAMENDA ) 10 MG tablet, TAKE 1 TABLET BY MOUTH TWICE DAILY., Disp: 60 tablet, Rfl: 0   rosuvastatin (CRESTOR) 5 MG tablet, Take 10 mg by mouth daily., Disp: , Rfl:    sertraline (ZOLOFT) 100 MG tablet, Take 100 mg by mouth daily., Disp: , Rfl:    Allergies: Allergies  Allergen Reactions   Lorazepam     confusion  Other Reaction(s): Mental Status Changes    REVIEW OF SYSTEMS:   Review of Systems  Constitutional:  Positive for fatigue.  Neurological:  Positive for dizziness.     VITALS:   Blood pressure 125/74, pulse 74, temperature 99.2 F (37.3 C), temperature source Oral, resp. rate 18, weight 103 lb (46.7 kg), SpO2 98%.  Wt Readings from Last 3 Encounters:  09/01/23 103 lb (46.7 kg)  05/24/23 100 lb 12 oz (45.7 kg)  05/03/23 99 lb 10.4 oz (45.2 kg)    Body mass index is 20.22 kg/m.   PHYSICAL EXAM:   Physical Exam Constitutional:      Appearance: Normal appearance.   Cardiovascular:     Rate and Rhythm: Normal rate and regular rhythm.  Pulmonary:     Effort: Pulmonary effort is normal.     Breath sounds: Normal breath sounds.  Abdominal:     General: Bowel sounds are normal.     Palpations: Abdomen is soft.   Musculoskeletal:        General: No swelling. Normal range of motion.   Neurological:     Mental Status: She is alert and oriented to person, place, and time. Mental status is at baseline.     LABS:   CBC    Component Value  Date/Time   WBC 9.6 08/25/2023 1028   RBC 4.00 08/25/2023 1028   HGB 11.5 (L) 08/25/2023 1028   HGB 11.4 12/04/2019 0849   HCT 35.7 (L) 08/25/2023 1028   HCT 34.5 12/04/2019 0849   PLT 262 08/25/2023 1028   PLT 307 12/04/2019 0849   MCV 89.3 08/25/2023 1028   MCV 87 12/04/2019 0849   MCH 28.8 08/25/2023 1028   MCHC 32.2 08/25/2023 1028   RDW 14.2 08/25/2023 1028   RDW 12.7 12/04/2019 0849   LYMPHSABS 1.6 05/03/2023 1409   LYMPHSABS 1.3 12/04/2019  0849   MONOABS 1.0 05/03/2023 1409   EOSABS 0.3 05/03/2023 1409   EOSABS 0.1 12/04/2019 0849   BASOSABS 0.1 05/03/2023 1409   BASOSABS 0.1 12/04/2019 0849    CMP    Component Value Date/Time   NA 136 05/03/2023 1409   NA 137 12/04/2019 0849   K 4.4 05/03/2023 1409   CL 102 05/03/2023 1409   CO2 24 05/03/2023 1409   GLUCOSE 99 05/03/2023 1409   BUN 16 05/03/2023 1409   BUN 12 12/04/2019 0849   CREATININE 1.20 (H) 08/01/2023 1101   CALCIUM 9.0 05/03/2023 1409   PROT 7.1 05/03/2023 1409   PROT 7.1 12/04/2019 0849   ALBUMIN 3.5 05/03/2023 1409   ALBUMIN 4.3 12/04/2019 0849   AST 23 05/03/2023 1409   ALT 17 05/03/2023 1409   ALKPHOS 82 05/03/2023 1409   BILITOT 0.3 05/03/2023 1409   BILITOT 0.2 12/04/2019 0849   GFRNONAA 51 (L) 05/03/2023 1409   GFRAA 61 12/04/2019 0849    No results found for: CEA1, CEA / No results found for: CEA1, CEA No results found for: PSA1 No results found for: CAN199 No results found for: RJW874  Lab Results  Component Value Date   TOTALPROTELP 6.6 05/03/2023   TOTALPROTELP 6.7 05/03/2023   ALBUMINELP 3.3 05/03/2023   A1GS 0.4 05/03/2023   A2GS 0.9 05/03/2023   BETS 1.3 05/03/2023   GAMS 0.7 05/03/2023   MSPIKE Not Observed 05/03/2023   SPEI Comment 05/03/2023   Lab Results  Component Value Date   TIBC 257 08/25/2023   TIBC 357 05/03/2023   FERRITIN 322 (H) 08/25/2023   FERRITIN 33 05/03/2023   IRONPCTSAT 42 (H) 08/25/2023   IRONPCTSAT 6 (L) 05/03/2023   Lab Results  Component Value Date   LDH 159 05/03/2023     STUDIES:   No results found.

## 2023-09-12 DIAGNOSIS — Z79899 Other long term (current) drug therapy: Secondary | ICD-10-CM | POA: Diagnosis not present

## 2023-09-12 DIAGNOSIS — Z1339 Encounter for screening examination for other mental health and behavioral disorders: Secondary | ICD-10-CM | POA: Diagnosis not present

## 2023-09-12 DIAGNOSIS — I10 Essential (primary) hypertension: Secondary | ICD-10-CM | POA: Diagnosis not present

## 2023-09-12 DIAGNOSIS — Z1331 Encounter for screening for depression: Secondary | ICD-10-CM | POA: Diagnosis not present

## 2023-09-12 DIAGNOSIS — E7800 Pure hypercholesterolemia, unspecified: Secondary | ICD-10-CM | POA: Diagnosis not present

## 2023-09-12 DIAGNOSIS — R5383 Other fatigue: Secondary | ICD-10-CM | POA: Diagnosis not present

## 2023-09-12 DIAGNOSIS — Z7189 Other specified counseling: Secondary | ICD-10-CM | POA: Diagnosis not present

## 2023-09-12 DIAGNOSIS — Z299 Encounter for prophylactic measures, unspecified: Secondary | ICD-10-CM | POA: Diagnosis not present

## 2023-09-12 DIAGNOSIS — Z0000 Encounter for general adult medical examination without abnormal findings: Secondary | ICD-10-CM | POA: Diagnosis not present

## 2023-09-15 ENCOUNTER — Other Ambulatory Visit: Payer: Self-pay | Admitting: Neurology

## 2023-09-18 NOTE — Telephone Encounter (Signed)
 Requested Prescriptions   Pending Prescriptions Disp Refills   cloBAZam  (ONFI ) 2.5 MG/ML solution [Pharmacy Med Name: CLOBAZAM  2.5 MG/ML SUSPENSION] 120 mL PRN    Sig: TAKE 1 ML BY MOUTH AT BEDTIME.   Last seen 11/01/22, next appt 10/31/23  Dispenses   Dispensed Days Supply Quantity Provider Pharmacy  CLOBAZAM  2.5 MG/ML SUSPENSION 08/03/2023 120 120 mL [place] Lauraine PARAS, NP [facility] - E...  CLOBAZAM  2.5 MG/ML SUSPENSION 03/27/2023 120 120 mL Gayland Lauraine PARAS, NP [facility] - E...  CLOBAZAM  2.5 MG/ML SUSPENSION 12/07/2022 28 28 mL Gayland Lauraine PARAS, NP [facility] - E...  CLOBAZAM  2.5 MG/ML SUSPENSION 11/01/2022 28 28 mL Gayland Lauraine PARAS, NP [facility] - E...  cloBAZam  (ONFI ) oral suspension 2.5 mg/mL 11/01/2022 90 90 Slack, Lauraine PARAS, NP [facility] - [email or web address]

## 2023-09-25 ENCOUNTER — Other Ambulatory Visit: Payer: Self-pay | Admitting: Neurology

## 2023-10-04 DIAGNOSIS — M810 Age-related osteoporosis without current pathological fracture: Secondary | ICD-10-CM | POA: Diagnosis not present

## 2023-10-04 DIAGNOSIS — G309 Alzheimer's disease, unspecified: Secondary | ICD-10-CM | POA: Diagnosis not present

## 2023-10-04 DIAGNOSIS — I10 Essential (primary) hypertension: Secondary | ICD-10-CM | POA: Diagnosis not present

## 2023-10-04 DIAGNOSIS — Z299 Encounter for prophylactic measures, unspecified: Secondary | ICD-10-CM | POA: Diagnosis not present

## 2023-10-04 DIAGNOSIS — N1831 Chronic kidney disease, stage 3a: Secondary | ICD-10-CM | POA: Diagnosis not present

## 2023-10-15 DIAGNOSIS — J069 Acute upper respiratory infection, unspecified: Secondary | ICD-10-CM | POA: Diagnosis not present

## 2023-10-25 DIAGNOSIS — C44519 Basal cell carcinoma of skin of other part of trunk: Secondary | ICD-10-CM | POA: Diagnosis not present

## 2023-10-25 DIAGNOSIS — L821 Other seborrheic keratosis: Secondary | ICD-10-CM | POA: Diagnosis not present

## 2023-10-25 DIAGNOSIS — L0100 Impetigo, unspecified: Secondary | ICD-10-CM | POA: Diagnosis not present

## 2023-10-25 DIAGNOSIS — D485 Neoplasm of uncertain behavior of skin: Secondary | ICD-10-CM | POA: Diagnosis not present

## 2023-10-25 DIAGNOSIS — L98499 Non-pressure chronic ulcer of skin of other sites with unspecified severity: Secondary | ICD-10-CM | POA: Diagnosis not present

## 2023-10-31 ENCOUNTER — Ambulatory Visit: Payer: PPO | Admitting: Neurology

## 2023-10-31 ENCOUNTER — Encounter: Payer: Self-pay | Admitting: Neurology

## 2023-10-31 VITALS — BP 106/68 | HR 90 | Ht 61.0 in | Wt 103.0 lb

## 2023-10-31 DIAGNOSIS — G3184 Mild cognitive impairment, so stated: Secondary | ICD-10-CM | POA: Diagnosis not present

## 2023-10-31 DIAGNOSIS — M47812 Spondylosis without myelopathy or radiculopathy, cervical region: Secondary | ICD-10-CM | POA: Diagnosis not present

## 2023-10-31 DIAGNOSIS — R569 Unspecified convulsions: Secondary | ICD-10-CM | POA: Diagnosis not present

## 2023-10-31 DIAGNOSIS — G40909 Epilepsy, unspecified, not intractable, without status epilepticus: Secondary | ICD-10-CM

## 2023-10-31 MED ORDER — LAMOTRIGINE ER 250 MG PO TB24: 1.0000 | ORAL_TABLET | Freq: Every day | ORAL | 4 refills | Status: DC

## 2023-10-31 MED ORDER — MEMANTINE HCL 10 MG PO TABS: 10.0000 mg | ORAL_TABLET | Freq: Two times a day (BID) | ORAL | 3 refills | Status: AC

## 2023-10-31 MED ORDER — LAMOTRIGINE ER 25 MG PO TB24: 50.0000 mg | ORAL_TABLET | Freq: Every day | ORAL | 4 refills | Status: DC

## 2023-10-31 NOTE — Patient Instructions (Signed)
 Continue current medications.  Check labs today.  Call for seizure activity.  Follow-up in 1 year.  Thanks!!

## 2023-10-31 NOTE — Progress Notes (Signed)
 Patient: Sheri Valencia Date of Birth: August 22, 1945  Reason for Visit: Follow up History from: Patient, sister Primary Neurologist: Dr. Onita   ASSESSMENT AND PLAN 78 y.o. year old female   1.  Complex partial seizure with secondary generalization -No recent seizure since Jul 18, 2021 (on Keppra  500 mg twice a day, Lamictal  XR 250 mg daily) -Continue Lamictal  XR 50 mg a.m., 250 mg p.m., Onfi  2.5 mg at bedtime (taper off Keppra  June 2023) -Check labs today -EEG was normal  2.  History of cervical spondylosis -Denies any worsening neck pain, gait or balance -MRI cervical spine 08/30/21 degenerative changes throughout, most at C5-C6 with severe right foraminal narrowing, potential for right C6 nerve root compression; also C6-C7 severe right greater than left foraminal narrowing, potential for C7 nerve root compression -Monitor symptoms, call for any worsening  3.  Mild cognitive impairment -Stable, MMSE 24/30 (25/30) -Continue Namenda  10 mg twice a day -Lives alone, does short distance driving -Continue to monitor for safety, supervision -MMSE 25/30 (29/30), continue Namenda  10 mg twice a day  Follow-up with me in 1 year, sooner if needed.  Call for any seizures.  HISTORY  [name] is a 78 years old right-handed Caucasian female,followed  for complex partial seizure with secondary generalization. She has a history of complex partial seizure with secondary generalization since age 26,  presented with rising sensation, then passed out followed by generalized motor seizure, last seizure was in 2002, she was treated with Lamictal  200 mg twice a day, continue to have recurrent seizure, Keppra  500 mg 2 tablets twice a day was added on, no recurrent seizure since. 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 Other levels alsodemonstrate significant stenosis.. She denies significant neck pain She has mild unsteady gait, always contributed to her left hip problem,  She was evaluated recently  by Dr. Gaither, neurosurgeon, she follows up yearly   She returns for reevaluation and refills. She is on Aricept by PCP Dr. Maree.    UPDATE Jul 05 2017:  I was able to review the letter from her  Sister [name], she is accompanied by her friend [name] at today's clinical visit,   From her sister's letter: Charnelle continue has memory problem, easily gets confused, gets frustrated easily, but deny her problem, there was also description of paranoid, emotional incontinence, forgetful, poor decision-making process for her health, irrational spell, recent purchase of a new car, but difficult to operating her new vehicle, impulsive action, got lost while driving, difficulty working with her computer, loose piece constantly, difficulty managing her bills, ongoing since 2017, progressively getting worse   [name] drove her here today, she has lived [place] for 50 years, 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   She likes to walk, reading.  She lives in a codominum.    She has not have seizure for few years. 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   UPDATE October 26 2017: She was able to taper off Keppra  500 mg twice daily, only on lamotrigine  200 mg twice a day, doing better, along with Namenda  10 mg twice a day, her memory seems to improve will stabilize, care of her elderly aunt,   We personally reviewed MRI of the brain in May 2019, mild generalized atrophy no acute abnormality,   EEG was normal    Laboratory evaluation in May 2019 showed normal TSH, B12, RPR, CBC, CMP showed mild elevated 1.25, with GFR of 44, Keppra  level was 66, lamotrigine  level was 22, she has no signs of toxicity, the level was not trough level   Virtual Visit via Video on Jul 11, 2018   She is with her sister [name] at visit.  She had no recurrent seizure, taking lamotrigine  200 mg twice a day, Keppra  500 mg twice a day, he complains of increased confusion, sometimes unsteady gait, fell few times, sister also concerned about worsening memory loss, had few incident of mis- handling her debit card,   Previously we have tried to taper off Keppra  500 mg 2 tablets twice a day, because her underweight, has not had recurrent seizure for many years, but she is concerned about potential recurrent seizure, put herself back on Keppra  1 tablets twice a day,   Laboratory evaluations on Jul 05, 2017, normal TSH 1.9, lamotrigine  22.7, Keppra  66.5, normal B12, CBC hemoglobin of 11.1, creatinine of 1.25, potassium of 5.4   Update October 31, 2018: She is with her sister [name] at today's visit, she continue to take lamotrigine  200 mg twice a day, has no recurrent seizure, she has worsening memory loss, excessive daytime sleepiness, fatigue, continue have depression, taking Zoloft 100 mg daily,  she complains of change of taste, has decreased appetite, with some weight loss,   Laboratory evaluations on Jul 12, 2018 showed lamotrigine  level was 36,   UPDATE Apr 05 2019: She is accompanied by her Sister [name] at today's clinical visit, before Christmas, she complains of few days history of poor appetite, frequent diarrhea, on March 03, 2019, she woke up on the floor, has difficulty getting up from the floor, had a transient loss of consciousness, but felt like it is not a seizure, she called ambulance, was brought to the emergency room at [facility], lamotrigine  level was 30, I personally reviewed CT head without contrast showed no acute findings UA showed evidence of UTI, her symptoms improved with hydration,   However, she remained confused, weak, difficulty walking was admitted to [facility] on March 08, 2019, personally reviewed record, CT head showed no acute abnormality, CT of cervical spine showed multilevel degenerative changes, most severe at C5-6, C6 and 7,   She was treated for UTI, planning on to take Macrobid 100 mg daily for 1 month, there was no recurrent seizure activity, she is back on her home regimen of lamotrigine  ER 200 mg 2 tablets every night   She does complains of low back pain, continue to feel generalized weakness, gait abnormality, I have to think about how to walk my leg, also worsening  urinary urgency, incontinence   Laboratory evaluation showed mild anemia hemoglobin of 10.8 CMP showed mildly low potassium 3.4, decreased albumin 3.4   She now lives with her sister temporarily, planning on for short-term assisted living placement due to her continued gait abnormality, worsening confusion,   UPDATE May 14 2019: She is with her sister at today's clinical visit, she is currently lives at at [facility], she had a significant improvement, no gait abnormality, no recurrent seizure, is planning on going home soon.   Acute onset of confusion in December  2020, is most likely related to her UTI, dehydration, polypharmacy treatment, depression anxiety, Lexapro 10 mg daily has been helpful.   We personally reviewed MRI of cervical spine on April 29, 2019: Multilevel degenerative changes, most severe at C5-6, with rightward disc protrusion, borderline mild spinal stenosis, no cord signal changes, severe right foraminal stenosis,   UPDATE Sept 13 2022: She had a breakthrough seizure on November 30, 2019, and May 02, 2020, both seizure happened while her sister was driving, she suddenly become required, forceful head turning to the right, neck hyperextension, body tonic-clonic movement,  She is now back on lamotrigine  XR 250 mg every night, Keppra  500 mg twice a day was added on, she tolerated it well, there was no recurrent seizure   UPDATE August 19 2021: [facility] [place]  for [place], she had a seizure on Jul 18, 2021, she finished taking a shower, stepping out, and loss of consciousness  Her sister heard a thump, but could not open the door, she was able to pick through the opening, patient had body jerking movement,  Paramedic was called, was treated at local hospital,   I reviewed her hospital record, blood pressure 173/77, temperature 98.1 pulse 100, laboratory evaluation showed mild elevated WBC 10.4, hemoglobin 11.4, creatinine 1, glucose 134 otherwise normal CMP, urine showed WBC 26-50, moderate epithelial cells,  CT head showed no acute intracranial pathology, CT angiogram showed 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, otherwise patent neck vasculature, no intracranial large vessel occlusion  CT of cervical region showed no fracture,, multilevel level of degenerative disease, grade 1 anterolisthesis of C4 on 5,  EKG showed normal sinus rhythm  She was treated with azithromycin , ceftriaxone  for UTI   Last antiepileptic level was in September 2022 with current dose, Keppra  level was 39.8, lamotrigine  level  was 13.7 She complains of worsening depression anxiety, crying spells since seizure, was given Wellbutrin, she worried about the side effect of lowered seizure threshold, is not taking it anymore,  In addition she complains of worsening gait abnormality, urinary frequency urgency since recurrent seizure and fall in May 2023  Update 09/29/21 SS: Here today with her sister, no recurrent seizure, on Lamictal  XR 50 mg AM/250 mg PM, Onfi  2.5 mg at bedtime.  Does have fatigue, takes a nap, not convinced medication related.  Sister thinks she is doing great.  Denies significant neck pain, has urinary urgency, no falls, or gait changes.  EEG was normal 08/24/21, MRI of cervical spine showed multilevel degenerative changes, at C5-6 severe right foraminal narrowing and moderate left, potential for right C6 nerve root compression, also C7 nerve root compression potential.  IMPRESSION: This MRI of the cervical spine without contrast shows the following: 1.  At C3-C4, there is mild spinal stenosis and moderate bilateral foraminal narrowing but no nerve root compression. 2.  At C4-C5, there is minimal anterolisthesis and other degenerative changes causing moderate left foraminal narrowing but no spinal  stenosis or nerve root compression. 3.  At C5-C6, there is a right paramedian disc protrusion and other degenerative change causing mild spinal stenosis and severe right foraminal narrowing and moderate left foraminal narrowing.  There is potential for right C6 nerve root compression. 4.  At C6-C7, there are degenerative changes causing mild spinal stenosis and moderately severe right greater than left foraminal narrowing.  There is potential for C7 nerve root compression to either side. 5.  Degenerative changes are stable compared to the 04/29/2019 MRI.  Update January 19, 2022 SS: No seizures, living alone, her sister drove her, remains on Onfi  2.5 mg at bedtime, Lamictal  XR 50 mg in AM, 250 mg PM. Denies any  problems or concerns. Saw PCP earlier today with recurrent UTI, started antibiotic. 09/29/21 Lamictal  level was 11.6.   Update November 01, 2022 SS: No seizures since May 2023, living alone, doing well. UTI few weeks ago, treated with antibiotics. Saw PCP for right sciatica, using lidocaine patches with good benefit, short distance driving, no issues. No neck pain. Has not fallen. Feels sleepy today.  MMSE 25/30.  Remains on Lamictal  total XR 300 mg daily, Onfi  2.5 mg at bedtime.   Update October 31, 2023 SS: No seizures. Remains on Onfi  2.5 mg at bedtime, Lamictal  XR 300 mg daily. Lamictal  level was 16.9. Fatigue, follows with hematology for iron infusion. MMSE 24/30. Short distance driving, only to church or grocery store, no accidents or getting lost. Manages her household, ADLs.    REVIEW OF SYSTEMS: Out of a complete 14 system review of symptoms, the patient complains only of the following symptoms, and all other reviewed systems are negative.  See HPI  ALLERGIES: Allergies  Allergen Reactions   Lorazepam     confusion  Other Reaction(s): Mental Status Changes    HOME MEDICATIONS: Outpatient Medications Prior to Visit  Medication Sig Dispense Refill   aspirin EC 81 MG tablet Take 81 mg by mouth every 6 (six) hours as needed for moderate pain.     FEROSUL 325 (65 Fe) MG tablet Take by mouth.     ferrous gluconate  (FERGON) 324 MG tablet Take 1 tablet (324 mg total) by mouth every other day. 90 tablet 3   LamoTRIgine  (LAMICTAL  XR) 25 MG TB24 24 hour tablet TAKE 2 TABLETS BY MOUTH ONCE DAILY. 60 tablet 0   LamoTRIgine  250 MG TB24 24 hour tablet TAKE 1 TABLET BY MOUTH AT BEDTIME. 30 tablet 0   losartan (COZAAR) 50 MG tablet Take 50 mg by mouth daily.     memantine  (NAMENDA ) 10 MG tablet TAKE 1 TABLET BY MOUTH TWICE DAILY. 60 tablet 0   rosuvastatin (CRESTOR) 5 MG tablet Take 10 mg by mouth daily.     sertraline (ZOLOFT) 100 MG tablet Take 100 mg by mouth daily.     venlafaxine (EFFEXOR) 25  MG tablet Take 25 mg by mouth 2 (two) times daily.     alendronate (FOSAMAX) 70 MG tablet Take 70 mg by mouth every Friday. (Patient not taking: Reported on 10/31/2023)     cloBAZam  (ONFI ) 2.5 MG/ML solution TAKE 1 ML BY MOUTH AT BEDTIME. (Patient not taking: Reported on 10/31/2023) 120 mL 0   estradiol  (ESTRACE ) 0.1 MG/GM vaginal cream Discard plastic applicator. Insert a blueberry size amount (approximately 1 gram) of cream on fingertip inside vagina every night at bedtime. For long term use. (Patient not taking: Reported on 10/31/2023) 30 g 3   No facility-administered medications prior to visit.  PAST MEDICAL HISTORY: Past Medical History:  Diagnosis Date   Depression    Memory loss    Seizure (HCC)    last seizure qwas over 1 year ago.    PAST SURGICAL HISTORY: Past Surgical History:  Procedure Laterality Date   APPENDECTOMY     BIOPSY  08/16/2022   Procedure: BIOPSY;  Surgeon: Eartha Angelia Sieving, MD;  [facility];  Service: Gastroenterology;;   COLONOSCOPY WITH PROPOFOL  N/A 08/16/2022   Procedure: COLONOSCOPY WITH PROPOFOL ;  Surgeon: Eartha Angelia Sieving, MD;  [facility];  Service: Gastroenterology;  Laterality: N/A;  730am, asa 3   HEMOSTASIS CLIP PLACEMENT  08/16/2022   Procedure: HEMOSTASIS CLIP PLACEMENT;  Surgeon: Eartha Angelia Sieving, MD;  [facility];  Service: Gastroenterology;;   POLYPECTOMY  08/16/2022   Procedure: POLYPECTOMY;  Surgeon: Eartha Angelia Sieving, MD;  [facility];  Service: Gastroenterology;;   MATIAS  08/16/2022   Procedure: MATIAS;  Surgeon: Eartha Angelia, Sieving, MD;  [facility];  Service: Gastroenterology;;   SUBMUCOSAL TATTOO INJECTION  08/16/2022   Procedure: SUBMUCOSAL TATTOO INJECTION;  Surgeon: Eartha Angelia Sieving, MD;  [facility];  Service: Gastroenterology;;   TUBAL LIGATION      FAMILY HISTORY: Family History  Problem Relation  Age of Onset   Pneumonia Mother    Heart attack Maternal Grandmother    Stroke Maternal Grandmother    Suicidality Brother    Aneurysm Maternal Aunt    Breast cancer Sister        04/2015  Stage I    SOCIAL HISTORY: Social History   Socioeconomic History   Marital status: Widowed    Spouse name: Not on file   Number of children: 0   Years of education: college   Highest education level: Not on file  Occupational History    Employer: RETIRED    Comment: retired  Tobacco Use   Smoking status: Never    Passive exposure: Never   Smokeless tobacco: Never  Vaping Use   Vaping status: Never Used  Substance and Sexual Activity   Alcohol  use: Yes    Alcohol /week: 1.0 standard drink of alcohol     Types: 1 Glasses of wine per week   Drug use: No   Sexual activity: Not Currently  Other Topics Concern   Not on file  Social History Narrative   Patient lives at home alone and she is widowed. Retired.   College education    Caffeine one cup daily.   Right handed         Social Drivers of Health   Financial Resource Strain: Not on file  Food Insecurity: No Food Insecurity (05/03/2023)   Hunger Vital Sign    Worried About Running Out of Food in the Last Year: Never true    Ran Out of Food in the Last Year: Never true  Transportation Needs: No Transportation Needs (05/03/2023)   PRAPARE - Administrator, Civil Service (Medical): No    Lack of Transportation (Non-Medical): No  Physical Activity: Not on file  Stress: Not on file  Social Connections: Unknown (07/19/2021)   Received from [facility]   Social Network    Social Network: Not on file  Intimate Partner Violence: Not At Risk (05/03/2023)   Humiliation, Afraid, Rape, and Kick questionnaire    Fear of Current or Ex-Partner: No    Emotionally Abused: No    Physically Abused: No    Sexually  Abused: No   PHYSICAL EXAM  Vitals:   10/31/23 1441  BP: 106/68  Pulse: 90  SpO2: 97%  Weight: 103 lb (46.7 kg)   Height: 5' 1 (1.549 m)   Body mass index is 19.46 kg/m.    10/31/2023    3:00 PM 11/01/2022    2:25 PM 01/19/2022   10:00 AM  MMSE - Mini Mental State Exam  Orientation to time 3 4 5   Orientation to Place 5 5 5   Registration 3 3 3   Attention/ Calculation 1 3 5   Recall 3 2 2   Language- name 2 objects 2 2 2   Language- repeat 1 1 1   Language- follow 3 step command 3 3 3   Language- read & follow direction 1 1 1   Write a sentence 1 1 1   Copy design 1 0 1  Total score 24 25 29    Generalized: Well developed, in no acute distress  Neurological examination  Mentation: Alert oriented to time, place, history taking. Follows all commands speech and language fluent Cranial nerve II-XII: Pupils were equal round reactive to light. Extraocular movements were full, visual field were full on confrontational test. Facial sensation and strength were normal.  Head turning and shoulder shrug  were normal and symmetric. Motor: The motor testing reveals 5 over 5 strength of all 4 extremities. Good symmetric motor tone is noted throughout.  Sensory: Sensory testing is intact to soft touch on all 4 extremities. No evidence of extinction is noted.  Coordination: Cerebellar testing reveals good finger-nose-finger and heel-to-shin bilaterally.  Gait and station: Gait is normal.  Slightly wide-based. Reflexes: Deep tendon reflexes are symmetric increased at the knees  DIAGNOSTIC DATA (LABS, IMAGING, TESTING) - I reviewed patient records, labs, notes, testing and imaging myself where available.  Lab Results  Component Value Date   WBC 9.6 08/25/2023   HGB 11.5 (L) 08/25/2023   HCT 35.7 (L) 08/25/2023   MCV 89.3 08/25/2023   PLT 262 08/25/2023      Component Value Date/Time   NA 136 05/03/2023 1409   NA 137 12/04/2019 0849   K 4.4 05/03/2023 1409   CL 102 05/03/2023 1409   CO2 24 05/03/2023 1409   GLUCOSE 99 05/03/2023 1409   BUN 16 05/03/2023 1409   BUN 12 12/04/2019 0849   CREATININE 1.20  (H) 08/01/2023 1101   CALCIUM 9.0 05/03/2023 1409   PROT 7.1 05/03/2023 1409   PROT 7.1 12/04/2019 0849   ALBUMIN 3.5 05/03/2023 1409   ALBUMIN 4.3 12/04/2019 0849   AST 23 05/03/2023 1409   ALT 17 05/03/2023 1409   ALKPHOS 82 05/03/2023 1409   BILITOT 0.3 05/03/2023 1409   BILITOT 0.2 12/04/2019 0849   GFRNONAA 51 (L) 05/03/2023 1409   GFRAA 61 12/04/2019 0849   No results found for: CHOL, HDL, LDLCALC, LDLDIRECT, TRIG, CHOLHDL No results found for: YHAJ8R Lab Results  Component Value Date   VITAMINB12 1,086 (H) 08/25/2023   Lab Results  Component Value Date   TSH 2.217 05/03/2023    Lauraine Born, AGNP-C, DNP 10/31/2023, 3:03 PM [facility] [address], [address] [place], [place] [address] [phone]

## 2023-11-01 ENCOUNTER — Ambulatory Visit: Payer: Self-pay | Admitting: Neurology

## 2023-11-01 LAB — CBC WITH DIFFERENTIAL/PLATELET
Basophils Absolute: 0.1 x10E3/uL (ref 0.0–0.2)
Basos: 1 %
EOS (ABSOLUTE): 0.2 x10E3/uL (ref 0.0–0.4)
Eos: 2 %
Hematocrit: 32.9 % — ABNORMAL LOW (ref 34.0–46.6)
Hemoglobin: 11 g/dL — ABNORMAL LOW (ref 11.1–15.9)
Immature Grans (Abs): 0 x10E3/uL (ref 0.0–0.1)
Immature Granulocytes: 0 %
Lymphocytes Absolute: 1.8 x10E3/uL (ref 0.7–3.1)
Lymphs: 16 %
MCH: 30.1 pg (ref 26.6–33.0)
MCHC: 33.4 g/dL (ref 31.5–35.7)
MCV: 90 fL (ref 79–97)
Monocytes Absolute: 0.7 x10E3/uL (ref 0.1–0.9)
Monocytes: 7 %
Neutrophils Absolute: 8.4 x10E3/uL — ABNORMAL HIGH (ref 1.4–7.0)
Neutrophils: 74 %
Platelets: 277 x10E3/uL (ref 150–450)
RBC: 3.65 x10E6/uL — ABNORMAL LOW (ref 3.77–5.28)
RDW: 13 % (ref 11.7–15.4)
WBC: 11.2 x10E3/uL — ABNORMAL HIGH (ref 3.4–10.8)

## 2023-11-01 LAB — COMPREHENSIVE METABOLIC PANEL WITH GFR
ALT: 14 IU/L (ref 0–32)
AST: 21 IU/L (ref 0–40)
Albumin: 4.1 g/dL (ref 3.8–4.8)
Alkaline Phosphatase: 154 IU/L — ABNORMAL HIGH (ref 44–121)
BUN/Creatinine Ratio: 18 (ref 12–28)
BUN: 21 mg/dL (ref 8–27)
Bilirubin Total: 0.3 mg/dL (ref 0.0–1.2)
CO2: 21 mmol/L (ref 20–29)
Calcium: 9.2 mg/dL (ref 8.7–10.3)
Chloride: 98 mmol/L (ref 96–106)
Creatinine, Ser: 1.17 mg/dL — ABNORMAL HIGH (ref 0.57–1.00)
Globulin, Total: 2.5 g/dL (ref 1.5–4.5)
Glucose: 96 mg/dL (ref 70–99)
Potassium: 4.1 mmol/L (ref 3.5–5.2)
Sodium: 136 mmol/L (ref 134–144)
Total Protein: 6.6 g/dL (ref 6.0–8.5)
eGFR: 48 mL/min/1.73 — ABNORMAL LOW (ref >59)

## 2023-11-01 LAB — LAMOTRIGINE LEVEL: Lamotrigine Lvl: 15.5 ug/mL (ref 2.0–20.0)

## 2023-11-01 NOTE — Telephone Encounter (Signed)
 Pt sister called , Informed PT Sister . She will get call back

## 2023-11-01 NOTE — Telephone Encounter (Signed)
 Lvm 11/01/23 by hf 1st attempt

## 2023-11-01 NOTE — Telephone Encounter (Signed)
-----   Message from Lauraine JINNY Born sent at 11/01/2023 11:40 AM EDT ----- Please call, labs show mildly elevated WBC count at 11.2, monitor for any signs of infection such as UTI, URI, other symptoms. Stable mildly low HGB 11.0. Stable mild elevation in creatinine 1.17,  make sure drinking enough water, mild elevated alkaline phos 154, little higher than prior, unclear etiology, should follow overtime. Lamictal  level is within a good range. Please send labs to PCP.  Thanks  ----- Message ----- From: Rebecka Memos Lab Results In Sent: 11/01/2023   7:38 AM EDT To: Lauraine JINNY Born, NP

## 2023-11-01 NOTE — Telephone Encounter (Signed)
Pt sister aware

## 2023-11-02 ENCOUNTER — Telehealth: Payer: Self-pay

## 2023-11-02 NOTE — Telephone Encounter (Signed)
 Labs faxed to PCP for UTI eval

## 2023-11-05 DIAGNOSIS — R030 Elevated blood-pressure reading, without diagnosis of hypertension: Secondary | ICD-10-CM | POA: Diagnosis not present

## 2023-11-05 DIAGNOSIS — Z681 Body mass index (BMI) 19 or less, adult: Secondary | ICD-10-CM | POA: Diagnosis not present

## 2023-11-05 DIAGNOSIS — N3001 Acute cystitis with hematuria: Secondary | ICD-10-CM | POA: Diagnosis not present

## 2023-11-07 ENCOUNTER — Telehealth: Payer: Self-pay | Admitting: Neurology

## 2023-11-07 NOTE — Telephone Encounter (Signed)
 I called the patient's sister. This past Saturday, she took off in her car and drove to [place]. Sunday her sister took her to urgent care for evaluation of UTI due to blood work showing mildly high WBC. She was positive for UTI, was confused, on antibiotics now. Yesterday she was still confused, doesn't remember the events of her driving to [place]. Repeats herself. Her sister had to call the police and help them look for her. Her seizures are usually grand mal, she originally told urgent care she had a seizure, but [name] isn't sure about this, usually are grand mal. I advised go to PCP to be seen for continued confusion, needs evaluation. Advised no driving for at least 6 months, possibly even indefinitely, at least until seen by me or Dr. Onita in 6 months. For now, we'll continue current seizures medications until sees PCP, could consider increasing of Onfi , asked sister to call me back later this week with update.

## 2023-11-08 DIAGNOSIS — I10 Essential (primary) hypertension: Secondary | ICD-10-CM | POA: Diagnosis not present

## 2023-11-08 DIAGNOSIS — G309 Alzheimer's disease, unspecified: Secondary | ICD-10-CM | POA: Diagnosis not present

## 2023-11-08 DIAGNOSIS — N390 Urinary tract infection, site not specified: Secondary | ICD-10-CM | POA: Diagnosis not present

## 2023-11-08 DIAGNOSIS — Z299 Encounter for prophylactic measures, unspecified: Secondary | ICD-10-CM | POA: Diagnosis not present

## 2023-11-08 DIAGNOSIS — L0100 Impetigo, unspecified: Secondary | ICD-10-CM | POA: Diagnosis not present

## 2023-11-08 DIAGNOSIS — Z85828 Personal history of other malignant neoplasm of skin: Secondary | ICD-10-CM | POA: Diagnosis not present

## 2023-11-08 DIAGNOSIS — R52 Pain, unspecified: Secondary | ICD-10-CM | POA: Diagnosis not present

## 2023-11-09 DIAGNOSIS — N390 Urinary tract infection, site not specified: Secondary | ICD-10-CM | POA: Diagnosis not present

## 2023-11-13 DIAGNOSIS — R569 Unspecified convulsions: Secondary | ICD-10-CM | POA: Diagnosis not present

## 2023-11-13 DIAGNOSIS — J029 Acute pharyngitis, unspecified: Secondary | ICD-10-CM | POA: Diagnosis not present

## 2023-11-13 DIAGNOSIS — R059 Cough, unspecified: Secondary | ICD-10-CM | POA: Diagnosis not present

## 2023-11-13 DIAGNOSIS — N1831 Chronic kidney disease, stage 3a: Secondary | ICD-10-CM | POA: Diagnosis not present

## 2023-11-13 DIAGNOSIS — F332 Major depressive disorder, recurrent severe without psychotic features: Secondary | ICD-10-CM | POA: Diagnosis not present

## 2023-11-13 DIAGNOSIS — N390 Urinary tract infection, site not specified: Secondary | ICD-10-CM | POA: Diagnosis not present

## 2023-11-13 DIAGNOSIS — Z299 Encounter for prophylactic measures, unspecified: Secondary | ICD-10-CM | POA: Diagnosis not present

## 2023-11-13 DIAGNOSIS — I10 Essential (primary) hypertension: Secondary | ICD-10-CM | POA: Diagnosis not present

## 2023-11-14 DIAGNOSIS — N390 Urinary tract infection, site not specified: Secondary | ICD-10-CM | POA: Diagnosis not present

## 2023-11-14 DIAGNOSIS — I10 Essential (primary) hypertension: Secondary | ICD-10-CM | POA: Diagnosis not present

## 2023-11-14 DIAGNOSIS — R454 Irritability and anger: Secondary | ICD-10-CM | POA: Diagnosis not present

## 2023-11-14 DIAGNOSIS — Z299 Encounter for prophylactic measures, unspecified: Secondary | ICD-10-CM | POA: Diagnosis not present

## 2023-11-22 DIAGNOSIS — Z23 Encounter for immunization: Secondary | ICD-10-CM | POA: Diagnosis not present

## 2023-11-22 DIAGNOSIS — R5383 Other fatigue: Secondary | ICD-10-CM | POA: Diagnosis not present

## 2023-11-22 DIAGNOSIS — Z299 Encounter for prophylactic measures, unspecified: Secondary | ICD-10-CM | POA: Diagnosis not present

## 2023-11-22 DIAGNOSIS — I10 Essential (primary) hypertension: Secondary | ICD-10-CM | POA: Diagnosis not present

## 2023-11-22 DIAGNOSIS — Z681 Body mass index (BMI) 19 or less, adult: Secondary | ICD-10-CM | POA: Diagnosis not present

## 2023-11-22 DIAGNOSIS — N390 Urinary tract infection, site not specified: Secondary | ICD-10-CM | POA: Diagnosis not present

## 2023-11-23 DIAGNOSIS — C44519 Basal cell carcinoma of skin of other part of trunk: Secondary | ICD-10-CM | POA: Diagnosis not present

## 2023-11-23 DIAGNOSIS — L905 Scar conditions and fibrosis of skin: Secondary | ICD-10-CM | POA: Diagnosis not present

## 2023-11-24 ENCOUNTER — Inpatient Hospital Stay: Attending: Hematology

## 2023-11-29 ENCOUNTER — Telehealth: Payer: Self-pay | Admitting: Neurology

## 2023-11-29 NOTE — Telephone Encounter (Signed)
 Pt sister [name] called to request to speak to Nurse or MD about PT not being able to drive , Sister is requesting a letter be sent out  to PT letting Pt know that she is unable to Drive . Pt sister is requested callback

## 2023-11-30 NOTE — Telephone Encounter (Signed)
 I do not think she should be driving at this time. She had an episode of getting lost with driving on the highway. We may consider driver's rehab to provide better evaluation of her driving.

## 2023-11-30 NOTE — Telephone Encounter (Signed)
 Sheri Valencia returned call and stated that the pt is determined to get car and pt needs to hear directly from us  that she cannot drive. Pt sister would be willing to pick it up routing to sarah to address on her return

## 2023-11-30 NOTE — Telephone Encounter (Signed)
 Call to patient sister, [name]. No answer. Left message to call back

## 2023-11-30 NOTE — Telephone Encounter (Signed)
 Pt's sister has called RN back

## 2023-12-01 ENCOUNTER — Inpatient Hospital Stay: Admitting: Oncology

## 2023-12-05 NOTE — Telephone Encounter (Signed)
 Phone room: notify sister that letter mailed to pt. Obtain sister address so that we mail mail the letter to her as well

## 2023-12-05 NOTE — Telephone Encounter (Signed)
 Letter mailed

## 2023-12-08 ENCOUNTER — Inpatient Hospital Stay: Attending: Hematology

## 2023-12-08 DIAGNOSIS — D508 Other iron deficiency anemias: Secondary | ICD-10-CM

## 2023-12-08 DIAGNOSIS — Z79899 Other long term (current) drug therapy: Secondary | ICD-10-CM | POA: Insufficient documentation

## 2023-12-08 DIAGNOSIS — I129 Hypertensive chronic kidney disease with stage 1 through stage 4 chronic kidney disease, or unspecified chronic kidney disease: Secondary | ICD-10-CM | POA: Insufficient documentation

## 2023-12-08 DIAGNOSIS — N1831 Chronic kidney disease, stage 3a: Secondary | ICD-10-CM | POA: Insufficient documentation

## 2023-12-08 DIAGNOSIS — D631 Anemia in chronic kidney disease: Secondary | ICD-10-CM | POA: Insufficient documentation

## 2023-12-08 DIAGNOSIS — R109 Unspecified abdominal pain: Secondary | ICD-10-CM | POA: Diagnosis not present

## 2023-12-08 DIAGNOSIS — R42 Dizziness and giddiness: Secondary | ICD-10-CM | POA: Insufficient documentation

## 2023-12-08 DIAGNOSIS — G4700 Insomnia, unspecified: Secondary | ICD-10-CM | POA: Diagnosis not present

## 2023-12-08 DIAGNOSIS — D509 Iron deficiency anemia, unspecified: Secondary | ICD-10-CM | POA: Insufficient documentation

## 2023-12-08 DIAGNOSIS — Z803 Family history of malignant neoplasm of breast: Secondary | ICD-10-CM | POA: Insufficient documentation

## 2023-12-08 DIAGNOSIS — F5089 Other specified eating disorder: Secondary | ICD-10-CM | POA: Insufficient documentation

## 2023-12-08 DIAGNOSIS — E538 Deficiency of other specified B group vitamins: Secondary | ICD-10-CM | POA: Diagnosis not present

## 2023-12-08 DIAGNOSIS — Z800 Family history of malignant neoplasm of digestive organs: Secondary | ICD-10-CM | POA: Diagnosis not present

## 2023-12-08 DIAGNOSIS — R5383 Other fatigue: Secondary | ICD-10-CM | POA: Insufficient documentation

## 2023-12-08 LAB — CBC
HCT: 33.1 % — ABNORMAL LOW (ref 36.0–46.0)
Hemoglobin: 10.9 g/dL — ABNORMAL LOW (ref 12.0–15.0)
MCH: 30.7 pg (ref 26.0–34.0)
MCHC: 32.9 g/dL (ref 30.0–36.0)
MCV: 93.2 fL (ref 80.0–100.0)
Platelets: 234 K/uL (ref 150–400)
RBC: 3.55 MIL/uL — ABNORMAL LOW (ref 3.87–5.11)
RDW: 12.4 % (ref 11.5–15.5)
WBC: 8.1 K/uL (ref 4.0–10.5)
nRBC: 0 % (ref 0.0–0.2)

## 2023-12-08 LAB — FERRITIN: Ferritin: 483 ng/mL — ABNORMAL HIGH (ref 11–307)

## 2023-12-08 LAB — IRON AND TIBC
Iron: 67 ug/dL (ref 28–170)
Saturation Ratios: 29 % (ref 10.4–31.8)
TIBC: 228 ug/dL — ABNORMAL LOW (ref 250–450)
UIBC: 161 ug/dL

## 2023-12-08 LAB — VITAMIN B12: Vitamin B-12: 1038 pg/mL — ABNORMAL HIGH (ref 180–914)

## 2023-12-12 DIAGNOSIS — I10 Essential (primary) hypertension: Secondary | ICD-10-CM | POA: Diagnosis not present

## 2023-12-12 DIAGNOSIS — N390 Urinary tract infection, site not specified: Secondary | ICD-10-CM | POA: Diagnosis not present

## 2023-12-12 DIAGNOSIS — R350 Frequency of micturition: Secondary | ICD-10-CM | POA: Diagnosis not present

## 2023-12-12 DIAGNOSIS — Z299 Encounter for prophylactic measures, unspecified: Secondary | ICD-10-CM | POA: Diagnosis not present

## 2023-12-12 DIAGNOSIS — G4710 Hypersomnia, unspecified: Secondary | ICD-10-CM | POA: Diagnosis not present

## 2023-12-13 LAB — METHYLMALONIC ACID, SERUM: Methylmalonic Acid, Quantitative: 329 nmol/L (ref 0–378)

## 2023-12-15 ENCOUNTER — Inpatient Hospital Stay: Admitting: Oncology

## 2023-12-15 VITALS — BP 120/62 | HR 76 | Temp 98.4°F | Resp 18 | Wt 99.2 lb

## 2023-12-15 DIAGNOSIS — D509 Iron deficiency anemia, unspecified: Secondary | ICD-10-CM | POA: Diagnosis not present

## 2023-12-15 DIAGNOSIS — D508 Other iron deficiency anemias: Secondary | ICD-10-CM

## 2023-12-15 DIAGNOSIS — E538 Deficiency of other specified B group vitamins: Secondary | ICD-10-CM

## 2023-12-15 NOTE — Progress Notes (Signed)
 [facility] OFFICE PROGRESS NOTE  Sheri Isles, MD  ASSESSMENT & PLAN:    Assessment & Plan Other iron deficiency anemia - Anemia from CKD and of iron deficiency. - Reviewed labs from 12/08/2023: Hemoglobin 10.9 (11.0).  Ferritin is 483 and percent saturation 29. - She received 1 dose of Monoferric  on 05/26/2023 and B12 injection. -She tolerated infusion well. -- continue ferrous gluconate  324 mg tablets every other day along with orange juice or vitamin C tablets.  --No additional IV iron needed given iron saturations are 29% and ferritin 483. - RTC 3 months for follow-up with repeat CBC, ferritin and iron panel.   Vitamin B12 deficiency disease - B12 level is normal at 1038. MMA normal.  - She received 1 B12 shot on 05/26/2023.   -I would recommend continuing B12 1 mg tablets daily but she does not need any additional B12 injections.   Orders Placed This Encounter  Procedures   CBC    Standing Status:   Future    Expected Date:   03/22/2024    Expiration Date:   12/14/2024   Ferritin    Standing Status:   Future    Expected Date:   03/22/2024    Expiration Date:   12/14/2024   Iron and TIBC ([facility] ONLY)    Standing Status:   Future    Expected Date:   03/22/2024    Expiration Date:   12/14/2024   Methylmalonic acid, serum    Standing Status:   Future    Expected Date:   03/22/2024    Expiration Date:   12/14/2024   Vitamin B12    Standing Status:   Future    Expected Date:   03/22/2024    Expiration Date:   12/14/2024    INTERVAL HISTORY: Patient returns for follow-up for IDA and B12 deficiency. Reports she feels tired all the time but also thinks its because she doesn't do anything. Appetite is 75%. Pain in her ride side rates it a 5/10. Has trouble staying asleep. Has dizziness at times.  Reports her niece was recently diagnosed with inoperable pancreatic cancer which is likely playing a role in her depression.  Has not received Iv  iron since March 2025. Has been taking oral iron and b12 and tolerating well.   Has not noticed any bleeding or brbpr.   We reviewed cbc, cmp, ferritin, iron panel, mma and b12 level.   SUMMARY OF HEMATOLOGIC HISTORY: Oncology History Overview Note  1.  Normocytic anemia: - Patient seen at the request of Dr. Rachele. - Last labs in November 2024 showed hemoglobin 9.6. - She is taking iron tablet daily for the last few weeks.  Denies any BRBPR/melena.  Ice pica positive.  No prior transfusion history.   2.  CKD stage IIIa: - CKD since 2019, secondary to single functioning kidney.  CKD most likely secondary to her history of multiple UTIs/obstructive issues, hypertension, followed closely by Dr. Rachele.  She has nonnephrotic range proteinuria.   3.  Social/family history: - Lives by herself at home.  She is accompanied by her sister today.  She is independent of ADLs and IADLs.  Non-smoker.  No chemical exposure.  No family history of anemia.  Sister had breast cancer and niece had pancreatic cancer.  Paternal uncle had breast cancer.    No history exists.     CBC    Component Value Date/Time   WBC 8.1 12/08/2023 1021   RBC 3.55 (L)  12/08/2023 1021   HGB 10.9 (L) 12/08/2023 1021   HGB 11.0 (L) 10/31/2023 1518   HCT 33.1 (L) 12/08/2023 1021   HCT 32.9 (L) 10/31/2023 1518   PLT 234 12/08/2023 1021   PLT 277 10/31/2023 1518   MCV 93.2 12/08/2023 1021   MCV 90 10/31/2023 1518   MCH 30.7 12/08/2023 1021   MCHC 32.9 12/08/2023 1021   RDW 12.4 12/08/2023 1021   RDW 13.0 10/31/2023 1518   LYMPHSABS 1.8 10/31/2023 1518   MONOABS 1.0 05/03/2023 1409   EOSABS 0.2 10/31/2023 1518   BASOSABS 0.1 10/31/2023 1518       Latest Ref Rng & Units 10/31/2023    3:18 PM 08/01/2023   11:01 AM 05/03/2023    2:09 PM  CMP  Glucose 70 - 99 mg/dL 96   99   BUN 8 - 27 mg/dL 21   16   Creatinine 9.42 - 1.00 mg/dL 8.82  8.79  8.86   Sodium 134 - 144 mmol/L 136   136   Potassium 3.5 - 5.2 mmol/L  4.1   4.4   Chloride 96 - 106 mmol/L 98   102   CO2 20 - 29 mmol/L 21   24   Calcium 8.7 - 10.3 mg/dL 9.2   9.0   Total Protein 6.0 - 8.5 g/dL 6.6   7.1   Total Bilirubin 0.0 - 1.2 mg/dL 0.3   0.3   Alkaline Phos 44 - 121 IU/L 154   82   AST 0 - 40 IU/L 21   23   ALT 0 - 32 IU/L 14   17      Lab Results  Component Value Date   FERRITIN 483 (H) 12/08/2023   VITAMINB12 1,038 (H) 12/08/2023    Vitals:   12/15/23 1108  BP: 120/62  Pulse: 76  Resp: 18  Temp: 98.4 F (36.9 C)  SpO2: 100%    Review of System:  Review of Systems  Constitutional:  Positive for malaise/fatigue.  Gastrointestinal:  Positive for abdominal pain.  Neurological:  Positive for dizziness.  Psychiatric/Behavioral:  The patient is nervous/anxious and has insomnia.     Physical Exam: Physical Exam Constitutional:      Appearance: Normal appearance.  HENT:     Head: Normocephalic and atraumatic.  Eyes:     Pupils: Pupils are equal, round, and reactive to light.  Cardiovascular:     Rate and Rhythm: Normal rate and regular rhythm.     Heart sounds: Normal heart sounds. No murmur heard. Pulmonary:     Effort: Pulmonary effort is normal.     Breath sounds: Normal breath sounds. No wheezing.  Abdominal:     General: Bowel sounds are normal. There is no distension.     Palpations: Abdomen is soft.     Tenderness: There is no abdominal tenderness.  Musculoskeletal:        General: Normal range of motion.     Cervical back: Normal range of motion.  Skin:    General: Skin is warm and dry.     Findings: No rash.  Neurological:     Mental Status: She is alert and oriented to person, place, and time.     Gait: Gait is intact.  Psychiatric:        Mood and Affect: Mood and affect normal.        Cognition and Memory: Memory normal.        Judgment: Judgment normal.  I spent 20 minutes dedicated to the care of this patient (face-to-face and non-face-to-face) on the date of the encounter to  include what is described in the assessment and plan.,  Delon Hope, NP 12/15/2023 5:40 PM

## 2023-12-15 NOTE — Assessment & Plan Note (Addendum)
-   B12 level is normal at 1038. MMA normal.  - She received 1 B12 shot on 05/26/2023.   -I would recommend continuing B12 1 mg tablets daily but she does not need any additional B12 injections.

## 2023-12-15 NOTE — Assessment & Plan Note (Addendum)
-   Anemia from CKD and of iron deficiency. - Reviewed labs from 12/08/2023: Hemoglobin 10.9 (11.0).  Ferritin is 483 and percent saturation 29. - She received 1 dose of Monoferric  on 05/26/2023 and B12 injection. -She tolerated infusion well. -- continue ferrous gluconate  324 mg tablets every other day along with orange juice or vitamin C tablets.  --No additional IV iron needed given iron saturations are 29% and ferritin 483. - RTC 3 months for follow-up with repeat CBC, ferritin and iron panel.

## 2023-12-16 ENCOUNTER — Encounter: Payer: Self-pay | Admitting: Oncology

## 2023-12-26 DIAGNOSIS — S199XXA Unspecified injury of neck, initial encounter: Secondary | ICD-10-CM | POA: Diagnosis not present

## 2023-12-26 DIAGNOSIS — Z79899 Other long term (current) drug therapy: Secondary | ICD-10-CM | POA: Diagnosis not present

## 2023-12-26 DIAGNOSIS — R079 Chest pain, unspecified: Secondary | ICD-10-CM | POA: Diagnosis not present

## 2023-12-26 DIAGNOSIS — J8410 Pulmonary fibrosis, unspecified: Secondary | ICD-10-CM | POA: Diagnosis not present

## 2023-12-26 DIAGNOSIS — M25552 Pain in left hip: Secondary | ICD-10-CM | POA: Diagnosis not present

## 2023-12-26 DIAGNOSIS — Z7982 Long term (current) use of aspirin: Secondary | ICD-10-CM | POA: Diagnosis not present

## 2023-12-26 DIAGNOSIS — I10 Essential (primary) hypertension: Secondary | ICD-10-CM | POA: Diagnosis not present

## 2023-12-26 DIAGNOSIS — S0081XA Abrasion of other part of head, initial encounter: Secondary | ICD-10-CM | POA: Diagnosis not present

## 2023-12-26 DIAGNOSIS — Z96642 Presence of left artificial hip joint: Secondary | ICD-10-CM | POA: Diagnosis not present

## 2023-12-26 DIAGNOSIS — G9389 Other specified disorders of brain: Secondary | ICD-10-CM | POA: Diagnosis not present

## 2023-12-26 DIAGNOSIS — M542 Cervicalgia: Secondary | ICD-10-CM | POA: Diagnosis not present

## 2023-12-26 DIAGNOSIS — W0110XA Fall on same level from slipping, tripping and stumbling with subsequent striking against unspecified object, initial encounter: Secondary | ICD-10-CM | POA: Diagnosis not present

## 2023-12-26 DIAGNOSIS — S0990XA Unspecified injury of head, initial encounter: Secondary | ICD-10-CM | POA: Diagnosis not present

## 2023-12-27 DIAGNOSIS — G309 Alzheimer's disease, unspecified: Secondary | ICD-10-CM | POA: Diagnosis not present

## 2023-12-27 DIAGNOSIS — Z299 Encounter for prophylactic measures, unspecified: Secondary | ICD-10-CM | POA: Diagnosis not present

## 2023-12-27 DIAGNOSIS — F0280 Dementia in other diseases classified elsewhere without behavioral disturbance: Secondary | ICD-10-CM | POA: Diagnosis not present

## 2023-12-27 DIAGNOSIS — I10 Essential (primary) hypertension: Secondary | ICD-10-CM | POA: Diagnosis not present

## 2023-12-27 DIAGNOSIS — W19XXXA Unspecified fall, initial encounter: Secondary | ICD-10-CM | POA: Diagnosis not present

## 2023-12-27 DIAGNOSIS — R569 Unspecified convulsions: Secondary | ICD-10-CM | POA: Diagnosis not present

## 2023-12-29 DIAGNOSIS — R58 Hemorrhage, not elsewhere classified: Secondary | ICD-10-CM | POA: Diagnosis not present

## 2023-12-29 DIAGNOSIS — E785 Hyperlipidemia, unspecified: Secondary | ICD-10-CM | POA: Diagnosis not present

## 2023-12-29 DIAGNOSIS — Z7982 Long term (current) use of aspirin: Secondary | ICD-10-CM | POA: Diagnosis not present

## 2023-12-29 DIAGNOSIS — I10 Essential (primary) hypertension: Secondary | ICD-10-CM | POA: Diagnosis not present

## 2023-12-29 DIAGNOSIS — Z79899 Other long term (current) drug therapy: Secondary | ICD-10-CM | POA: Diagnosis not present

## 2024-01-03 DIAGNOSIS — I10 Essential (primary) hypertension: Secondary | ICD-10-CM | POA: Diagnosis not present

## 2024-01-03 DIAGNOSIS — Z299 Encounter for prophylactic measures, unspecified: Secondary | ICD-10-CM | POA: Diagnosis not present

## 2024-01-03 DIAGNOSIS — F0280 Dementia in other diseases classified elsewhere without behavioral disturbance: Secondary | ICD-10-CM | POA: Diagnosis not present

## 2024-01-03 DIAGNOSIS — R52 Pain, unspecified: Secondary | ICD-10-CM | POA: Diagnosis not present

## 2024-01-03 DIAGNOSIS — G309 Alzheimer's disease, unspecified: Secondary | ICD-10-CM | POA: Diagnosis not present

## 2024-01-04 ENCOUNTER — Other Ambulatory Visit (HOSPITAL_COMMUNITY)
Admission: RE | Admit: 2024-01-04 | Discharge: 2024-01-04 | Disposition: A | Discharge status: Home / Self Care | Source: Ambulatory Visit | Attending: Nephrology | Admitting: Nephrology

## 2024-01-04 DIAGNOSIS — R809 Proteinuria, unspecified: Secondary | ICD-10-CM | POA: Diagnosis not present

## 2024-01-04 DIAGNOSIS — N189 Chronic kidney disease, unspecified: Secondary | ICD-10-CM | POA: Diagnosis not present

## 2024-01-04 DIAGNOSIS — D631 Anemia in chronic kidney disease: Secondary | ICD-10-CM | POA: Insufficient documentation

## 2024-01-04 LAB — CBC WITH DIFFERENTIAL/PLATELET
Abs Immature Granulocytes: 0.08 K/uL — ABNORMAL HIGH (ref 0.00–0.07)
Basophils Absolute: 0.1 K/uL (ref 0.0–0.1)
Basophils Relative: 0 %
Eosinophils Absolute: 0.2 K/uL (ref 0.0–0.5)
Eosinophils Relative: 2 %
HCT: 33.2 % — ABNORMAL LOW (ref 36.0–46.0)
Hemoglobin: 10.8 g/dL — ABNORMAL LOW (ref 12.0–15.0)
Immature Granulocytes: 1 %
Lymphocytes Relative: 11 %
Lymphs Abs: 1.5 K/uL (ref 0.7–4.0)
MCH: 30.1 pg (ref 26.0–34.0)
MCHC: 32.5 g/dL (ref 30.0–36.0)
MCV: 92.5 fL (ref 80.0–100.0)
Monocytes Absolute: 0.9 K/uL (ref 0.1–1.0)
Monocytes Relative: 7 %
Neutro Abs: 11.1 K/uL — ABNORMAL HIGH (ref 1.7–7.7)
Neutrophils Relative %: 79 %
Platelets: 278 K/uL (ref 150–400)
RBC: 3.59 MIL/uL — ABNORMAL LOW (ref 3.87–5.11)
RDW: 12.3 % (ref 11.5–15.5)
WBC: 13.8 K/uL — ABNORMAL HIGH (ref 4.0–10.5)
nRBC: 0 % (ref 0.0–0.2)

## 2024-01-04 LAB — RENAL FUNCTION PANEL
Albumin: 4 g/dL (ref 3.5–5.0)
Anion gap: 10 (ref 5–15)
BUN: 19 mg/dL (ref 8–23)
CO2: 26 mmol/L (ref 22–32)
Calcium: 9.1 mg/dL (ref 8.9–10.3)
Chloride: 99 mmol/L (ref 98–111)
Creatinine, Ser: 1.24 mg/dL — ABNORMAL HIGH (ref 0.44–1.00)
GFR, Estimated: 45 mL/min — ABNORMAL LOW (ref >60)
Glucose, Bld: 93 mg/dL (ref 70–99)
Phosphorus: 3 mg/dL (ref 2.5–4.6)
Potassium: 4.5 mmol/L (ref 3.5–5.1)
Sodium: 135 mmol/L (ref 135–145)

## 2024-01-05 ENCOUNTER — Other Ambulatory Visit (HOSPITAL_COMMUNITY)
Admission: RE | Admit: 2024-01-05 | Discharge: 2024-01-05 | Disposition: A | Discharge status: Home / Self Care | Source: Ambulatory Visit | Attending: Nephrology | Admitting: Nephrology

## 2024-01-05 DIAGNOSIS — R809 Proteinuria, unspecified: Secondary | ICD-10-CM | POA: Insufficient documentation

## 2024-01-05 DIAGNOSIS — D631 Anemia in chronic kidney disease: Secondary | ICD-10-CM | POA: Diagnosis not present

## 2024-01-05 DIAGNOSIS — N189 Chronic kidney disease, unspecified: Secondary | ICD-10-CM | POA: Diagnosis not present

## 2024-01-05 DIAGNOSIS — N270 Small kidney, unilateral: Secondary | ICD-10-CM | POA: Diagnosis not present

## 2024-01-05 DIAGNOSIS — D509 Iron deficiency anemia, unspecified: Secondary | ICD-10-CM | POA: Diagnosis not present

## 2024-01-05 DIAGNOSIS — D638 Anemia in other chronic diseases classified elsewhere: Secondary | ICD-10-CM | POA: Diagnosis not present

## 2024-01-05 DIAGNOSIS — N1831 Chronic kidney disease, stage 3a: Secondary | ICD-10-CM | POA: Diagnosis not present

## 2024-01-05 LAB — PROTEIN / CREATININE RATIO, URINE
Creatinine, Urine: 64 mg/dL
Protein Creatinine Ratio: 0.26 mg/mg{creat} — ABNORMAL HIGH (ref 0.00–0.15)
Total Protein, Urine: 16 mg/dL

## 2024-01-07 DIAGNOSIS — W06XXXA Fall from bed, initial encounter: Secondary | ICD-10-CM | POA: Diagnosis not present

## 2024-01-07 DIAGNOSIS — S71111A Laceration without foreign body, right thigh, initial encounter: Secondary | ICD-10-CM | POA: Diagnosis not present

## 2024-01-07 DIAGNOSIS — R569 Unspecified convulsions: Secondary | ICD-10-CM | POA: Diagnosis not present

## 2024-01-07 DIAGNOSIS — S7012XA Contusion of left thigh, initial encounter: Secondary | ICD-10-CM | POA: Diagnosis not present

## 2024-01-07 DIAGNOSIS — Z79899 Other long term (current) drug therapy: Secondary | ICD-10-CM | POA: Diagnosis not present

## 2024-01-07 DIAGNOSIS — E785 Hyperlipidemia, unspecified: Secondary | ICD-10-CM | POA: Diagnosis not present

## 2024-01-07 DIAGNOSIS — N289 Disorder of kidney and ureter, unspecified: Secondary | ICD-10-CM | POA: Diagnosis not present

## 2024-01-07 DIAGNOSIS — S00531A Contusion of lip, initial encounter: Secondary | ICD-10-CM | POA: Diagnosis not present

## 2024-01-07 DIAGNOSIS — I10 Essential (primary) hypertension: Secondary | ICD-10-CM | POA: Diagnosis not present

## 2024-01-11 ENCOUNTER — Encounter (INDEPENDENT_AMBULATORY_CARE_PROVIDER_SITE_OTHER): Payer: Self-pay | Admitting: Gastroenterology

## 2024-01-12 DIAGNOSIS — N1831 Chronic kidney disease, stage 3a: Secondary | ICD-10-CM | POA: Diagnosis not present

## 2024-01-12 DIAGNOSIS — I10 Essential (primary) hypertension: Secondary | ICD-10-CM | POA: Diagnosis not present

## 2024-01-12 DIAGNOSIS — S81819A Laceration without foreign body, unspecified lower leg, initial encounter: Secondary | ICD-10-CM | POA: Diagnosis not present

## 2024-01-12 DIAGNOSIS — Z299 Encounter for prophylactic measures, unspecified: Secondary | ICD-10-CM | POA: Diagnosis not present

## 2024-01-15 ENCOUNTER — Telehealth: Payer: Self-pay | Admitting: Neurology

## 2024-01-15 DIAGNOSIS — R569 Unspecified convulsions: Secondary | ICD-10-CM

## 2024-01-15 NOTE — Telephone Encounter (Signed)
 Returned call to [facility] and she stated that the pt was placed in assisted living in October due to being confused and recent fall found unconscious by a neighbor. She fell again after that a week later and suffered a huge laceration to her back of leg and just a huge fall risk so she placed her in assisted living [facility] in [place]. Pt sister would like a letter written by Dr. Onita stating that due to her diagnosis and mental/cognitive decline its imperative for her to have round the clock care/supervision.

## 2024-01-15 NOTE — Telephone Encounter (Signed)
 Pt's sister is asking for a call form Haleigh, CMA to discuss pt being placed by her to assisted living on 11-07 she is asking for a call discuss needed back up.  For pt's Long Term Care provider pt's sister is asking for documentation re: pt's diagnosis by Dr Onita.  Pt is at [facility] in [place].

## 2024-01-16 DIAGNOSIS — S81819A Laceration without foreign body, unspecified lower leg, initial encounter: Secondary | ICD-10-CM | POA: Diagnosis not present

## 2024-01-16 DIAGNOSIS — N1831 Chronic kidney disease, stage 3a: Secondary | ICD-10-CM | POA: Diagnosis not present

## 2024-01-16 DIAGNOSIS — I10 Essential (primary) hypertension: Secondary | ICD-10-CM | POA: Diagnosis not present

## 2024-01-16 DIAGNOSIS — Z299 Encounter for prophylactic measures, unspecified: Secondary | ICD-10-CM | POA: Diagnosis not present

## 2024-01-16 NOTE — Telephone Encounter (Signed)
 Called and spoke with pt's sister, Harland and scheduled OV with Lauraine for 01/24/24 at 2:45pm. Offered EEG appointment on 01/18/24 at 2pm but she stated that she will not be able to come in that day but she will call a friend to see if they will be able to bring her and she will call us  back to schedule.

## 2024-01-16 NOTE — Addendum Note (Signed)
 Addended by: GAYLAND LAURAINE PARAS on: 01/16/2024 12:13 PM   Modules accepted: Orders

## 2024-01-16 NOTE — Telephone Encounter (Signed)
 I called the patient's sister. 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  When last seen in August memory score was 24/30.  Appears she has had a significant decline since then. Will get her in for revisit to recheck memory testing.

## 2024-01-18 ENCOUNTER — Other Ambulatory Visit: Admitting: *Deleted

## 2024-01-22 ENCOUNTER — Ambulatory Visit: Admitting: Neurology

## 2024-01-22 DIAGNOSIS — R569 Unspecified convulsions: Secondary | ICD-10-CM

## 2024-01-23 NOTE — Progress Notes (Unsigned)
 Patient: Sheri Valencia Date of Birth: [birth year]  Reason for Visit: Follow up History from: Patient, sister Primary Neurologist: Dr. Onita   ASSESSMENT AND PLAN 78 y.o. year old female   1.  Complex partial seizure with secondary generalization -No recent seizure since Jul 18, 2021 (on Keppra  500 mg twice a day, Lamictal  XR 250 mg daily) -Now at assisted living facility, medication list only includes Lamictal  XR 250 mg daily, will ensure medications are as follow:  -Lamictal  XR 50 mg AM  -Lamictal  XR 250 mg at bedtime  -Onfi  2.5 mg at bedtime  -Are generic, recommend to facility adding back in 1 at a time to minimize medication side effect -EEG is pending -Tapered off Keppra  in June 2023  2.  History of cervical spondylosis -Denies any worsening neck pain, gait or balance -MRI cervical spine 08/30/21 degenerative changes throughout, most at C5-C6 with severe right foraminal narrowing, potential for right C6 nerve root compression; also C6-C7 severe right greater than left foraminal narrowing, potential for C7 nerve root compression  3.  Dementia, cognitive impairment -Worsening over the last several months, MMSE 22/30, now living at assisted living, needs more supervision, medication management -Screen ATN profile -Continue Namenda  10 mg twice a day -No driving, recently multiple episodes of getting lost  Orders Placed This Encounter  Procedures   ATN PROFILE   Lamotrigine  level   TSH   Vitamin D, 25-hydroxy   HISTORY  Sheri Valencia is a 78 years old right-handed Caucasian female,followed  for complex partial seizure with secondary generalization. She has a history of complex partial seizure with secondary generalization since age 28,  presented with rising sensation, then passed out followed by generalized motor seizure, last seizure was in 2002, she was treated with Lamictal  200 mg twice a day, continue to have recurrent seizure, Keppra  500 mg 2 tablets twice a day was added on,  no recurrent seizure since. 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 Other levels alsodemonstrate significant stenosis.. She denies significant neck pain She has mild unsteady gait, always contributed to her left hip problem,  She was evaluated recently  by Dr. Gaither, neurosurgeon, she follows up yearly   She returns for reevaluation and refills. She is on Aricept by PCP Dr. Maree.    UPDATE Jul 05 2017:  I was able to review the letter from her  Sister [name], she is accompanied by her friend [name] at today's clinical visit,   From her sister's letter: [name] continue has memory problem, easily gets confused, gets frustrated easily, but deny her problem, there was also description of paranoid, emotional incontinence, forgetful, poor decision-making process for her health, irrational spell, recent purchase of a new car, but difficult to operating her new vehicle, impulsive action, got lost while driving, difficulty working with her computer, loose piece constantly, difficulty managing her bills, ongoing since 2017, progressively getting worse   [name] drove her here today, she has lived [name] for 50 years, 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   She likes to walk, reading.  She lives in  a codominum.    She has not have seizure for few years. 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   UPDATE October 26 2017: She was able to taper off Keppra  500 mg twice daily, only on lamotrigine  200 mg twice a day, doing better, along with Namenda  10 mg twice a day, her memory seems to improve will stabilize, care of her elderly aunt,   We personally reviewed MRI of the brain in May 2019, mild generalized atrophy no acute abnormality,   EEG was normal    Laboratory evaluation in May 2019 showed normal TSH, B12, RPR, CBC, CMP showed mild elevated 1.25, with GFR of 44, Keppra  level was 66, lamotrigine  level was 22, she has no signs of toxicity, the level was not trough level   Virtual Visit via Video on Jul 11, 2018   She is with her sister [name] at visit.  She had no recurrent seizure, taking lamotrigine  200 mg twice a day, Keppra  500 mg twice a day, he complains of increased confusion, sometimes unsteady gait, fell few times, sister also concerned about worsening memory loss, had few incident of mis- handling her debit card,   Previously we have tried to taper off Keppra  500 mg 2 tablets twice a day, because her underweight, has not had recurrent seizure for many years, but she is concerned about potential recurrent seizure, put herself back on Keppra  1 tablets twice a day,   Laboratory evaluations on Jul 05, 2017, normal TSH 1.9, lamotrigine  22.7, Keppra  66.5, normal B12, CBC hemoglobin of 11.1, creatinine of 1.25, potassium of 5.4   Update  October 31, 2018: She is with her sister [name] at today's visit, she continue to take lamotrigine  200 mg twice a day, has no recurrent seizure, she has worsening memory loss, excessive daytime sleepiness, fatigue, continue have depression, taking Zoloft 100 mg daily, she complains of change of taste, has decreased appetite, with some weight loss,   Laboratory evaluations on Jul 12, 2018 showed lamotrigine  level was 58,   UPDATE Apr 05 2019: She is accompanied by her Sister Harland at today's clinical visit, before Christmas, she complains of few days history of poor appetite, frequent diarrhea, on March 03, 2019, she woke up on the floor, has difficulty getting up from the floor, had a transient loss of consciousness, but felt like it is not a seizure, she called ambulance, was brought to the emergency room at [facility], lamotrigine  level was 30, I personally reviewed CT head without contrast showed no acute findings UA showed evidence of UTI, her symptoms improved with hydration,   However, she remained confused, weak, difficulty walking was admitted to [facility] on March 08, 2019, personally reviewed record, CT head showed no acute abnormality, CT of cervical spine showed multilevel degenerative changes, most severe at C5-6, C6 and 7,   She was treated for UTI, planning on to take Macrobid 100 mg daily for 1 month, there was no  recurrent seizure activity, she is back on her home regimen of lamotrigine  ER 200 mg 2 tablets every night   She does complains of low back pain, continue to feel generalized weakness, gait abnormality, I have to think about how to walk my leg, also worsening urinary urgency, incontinence   Laboratory evaluation showed mild anemia hemoglobin of 10.8 CMP showed mildly low potassium 3.4, decreased albumin 3.4   She now lives with her sister temporarily, planning on for short-term assisted living placement due to her continued gait abnormality, worsening confusion,    UPDATE May 14 2019: She is with her sister at today's clinical visit, she is currently lives at at [facility], she had a significant improvement, no gait abnormality, no recurrent seizure, is planning on going home soon.   Acute onset of confusion in December 2020, is most likely related to her UTI, dehydration, polypharmacy treatment, depression anxiety, Lexapro 10 mg daily has been helpful.   We personally reviewed MRI of cervical spine on April 29, 2019: Multilevel degenerative changes, most severe at C5-6, with rightward disc protrusion, borderline mild spinal stenosis, no cord signal changes, severe right foraminal stenosis,   UPDATE Sept 13 2022: She had a breakthrough seizure on November 30, 2019, and May 02, 2020, both seizure happened while her sister was driving, she suddenly become required, forceful head turning to the right, neck hyperextension, body tonic-clonic movement,  She is now back on lamotrigine  XR 250 mg every night, Keppra  500 mg twice a day was added on, she tolerated it well, there was no recurrent seizure   UPDATE August 19 2021: [facility]  for vacation, she had a seizure on Jul 18, 2021, she finished taking a shower, stepping out, and loss of consciousness  Her sister heard a thump, but could not open the door, she was able to pick through the opening, patient had body jerking movement,  Paramedic was called, was treated at local hospital,   I reviewed her hospital record, blood pressure 173/77, temperature 98.1 pulse 100, laboratory evaluation showed mild elevated WBC 10.4, hemoglobin 11.4, creatinine 1, glucose 134 otherwise normal CMP, urine showed WBC 26-50, moderate epithelial cells,  CT head showed no acute intracranial pathology, CT angiogram showed 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, otherwise patent neck vasculature, no intracranial large vessel occlusion  CT of cervical region showed no fracture,, multilevel level  of degenerative disease, grade 1 anterolisthesis of C4 on 5,  EKG showed normal sinus rhythm  She was treated with azithromycin , ceftriaxone  for UTI   Last antiepileptic level was in September 2022 with current dose, Keppra  level was 39.8, lamotrigine  level was 13.7 She complains of worsening depression anxiety, crying spells since seizure, was given Wellbutrin, she worried about the side effect of lowered seizure threshold, is not taking it anymore,  In addition she complains of worsening gait abnormality, urinary frequency urgency since recurrent seizure and fall in May 2023  Update 09/29/21 SS: Here today with her sister, no recurrent seizure, on Lamictal  XR 50 mg AM/250 mg PM, Onfi  2.5 mg at bedtime.  Does have fatigue, takes a nap, not convinced medication related.  Sister thinks she is doing great.  Denies significant neck pain, has urinary urgency, no falls, or gait changes.  EEG was normal 08/24/21, MRI of cervical spine showed multilevel degenerative changes, at C5-6 severe right foraminal narrowing and moderate left, potential for right C6 nerve root compression, also C7 nerve root compression potential.  IMPRESSION: This MRI of  the cervical spine without contrast shows the following: 1.  At C3-C4, there is mild spinal stenosis and moderate bilateral foraminal narrowing but no nerve root compression. 2.  At C4-C5, there is minimal anterolisthesis and other degenerative changes causing moderate left foraminal narrowing but no spinal stenosis or nerve root compression. 3.  At C5-C6, there is a right paramedian disc protrusion and other degenerative change causing mild spinal stenosis and severe right foraminal narrowing and moderate left foraminal narrowing.  There is potential for right C6 nerve root compression. 4.  At C6-C7, there are degenerative changes causing mild spinal stenosis and moderately severe right greater than left foraminal narrowing.  There is potential for C7 nerve root  compression to either side. 5.  Degenerative changes are stable compared to the 04/29/2019 MRI.  Update January 19, 2022 SS: No seizures, living alone, her sister drove her, remains on Onfi  2.5 mg at bedtime, Lamictal  XR 50 mg in AM, 250 mg PM. Denies any problems or concerns. Saw PCP earlier today with recurrent UTI, started antibiotic. 09/29/21 Lamictal  level was 11.6.   Update November 01, 2022 SS: No seizures since May 2023, living alone, doing well. UTI few weeks ago, treated with antibiotics. Saw PCP for right sciatica, using lidocaine patches with good benefit, short distance driving, no issues. No neck pain. Has not fallen. Feels sleepy today.  MMSE 25/30.  Remains on Lamictal  total XR 300 mg daily, Onfi  2.5 mg at bedtime.   Update October 31, 2023 SS: No seizures. Remains on Onfi  2.5 mg at bedtime, Lamictal  XR 300 mg daily. Lamictal  level was 16.9. Fatigue, follows with hematology for iron infusion. MMSE 24/30. Short distance driving, only to church or grocery store, no accidents or getting lost. Manages her household, ADLs.   Update 01/24/24 SS: MMSE 22/30 today. Since last seen, few episodes of driving and getting lost, worsening memory. Is now at assisted living. She was agreeable.Trying to get long term insurance to help pay. Has chronic UTI, 1 kidney. Fall 10/21, 10/24, 11/2 (got off the bed during the night). EEG yesterday pending. Needs assistance of medication, housework, judgment is impaired, supervision in showering due to falls. Pulling trash can in, fell over. Prior to falls in October, not taking medication, hiding it in the drawer. Worsening cognition, driving when told not to. Since at the facility, back on medication, cognitive state has improved some. Still memory issues, repeating herself, asking same questions. Looking at medication from [facility], only taking Lamictal  XR 250 mg daily, I do not see the Lamictal  XR 50 mg AM, Onfi  2.5 mg at bedtime. On Namenda  10 mg BID. CT head  12/26/23 negative.    REVIEW OF SYSTEMS: Out of a complete 14 system review of symptoms, the patient complains only of the following symptoms, and all other reviewed systems are negative.  See HPI  ALLERGIES: Allergies  Allergen Reactions   Lorazepam Other (See Comments)    confusion  Other Reaction(s): Mental Status Changes  Other Reaction(s): Confusion  confusion    confusion  Other Reaction(s): Mental Status Changes    HOME MEDICATIONS: Outpatient Medications Prior to Visit  Medication Sig Dispense Refill   alendronate (FOSAMAX) 70 MG tablet Take 70 mg by mouth every Friday.     lamoTRIgine  (LAMICTAL  XR) 50 MG 24 hour tablet Take 50 mg by mouth daily.     losartan (COZAAR) 50 MG tablet Take 50 mg by mouth daily.     memantine  (NAMENDA ) 10 MG tablet Take 1 tablet (10  mg total) by mouth 2 (two) times daily. 180 tablet 3   rosuvastatin (CRESTOR) 5 MG tablet Take 10 mg by mouth daily.     sertraline (ZOLOFT) 100 MG tablet Take 100 mg by mouth daily.     venlafaxine (EFFEXOR) 25 MG tablet Take 25 mg by mouth 2 (two) times daily.     aspirin EC 81 MG tablet Take 81 mg by mouth every 6 (six) hours as needed for moderate pain.     cloBAZam  (ONFI ) 2.5 MG/ML solution TAKE 1 ML BY MOUTH AT BEDTIME. 120 mL 0   estradiol  (ESTRACE ) 0.1 MG/GM vaginal cream Discard plastic applicator. Insert a blueberry size amount (approximately 1 gram) of cream on fingertip inside vagina every night at bedtime. For long term use. 30 g 3   FEROSUL 325 (65 Fe) MG tablet Take by mouth.     ferrous gluconate  (FERGON) 324 MG tablet Take 1 tablet (324 mg total) by mouth every other day. 90 tablet 3   No facility-administered medications prior to visit.    PAST MEDICAL HISTORY: Past Medical History:  Diagnosis Date   Depression    Memory loss    Seizure (HCC)    last seizure qwas over 1 year ago.    PAST SURGICAL HISTORY: Past Surgical History:  Procedure Laterality Date   APPENDECTOMY     BIOPSY   08/16/2022   Procedure: BIOPSY;  Surgeon: Eartha Angelia Sieving, MD;  [facility];  Service: Gastroenterology;;   COLONOSCOPY WITH PROPOFOL  N/A 08/16/2022   Procedure: COLONOSCOPY WITH PROPOFOL ;  Surgeon: Eartha Angelia Sieving, MD;  [facility];  Service: Gastroenterology;  Laterality: N/A;  730am, asa 3   HEMOSTASIS CLIP PLACEMENT  08/16/2022   Procedure: HEMOSTASIS CLIP PLACEMENT;  Surgeon: Eartha Angelia Sieving, MD;  [facility];  Service: Gastroenterology;;   POLYPECTOMY  08/16/2022   Procedure: POLYPECTOMY;  Surgeon: Eartha Angelia Sieving, MD;  [facility];  Service: Gastroenterology;;   MATIAS  08/16/2022   Procedure: MATIAS;  Surgeon: Eartha Angelia, Sieving, MD;  [facility];  Service: Gastroenterology;;   SUBMUCOSAL TATTOO INJECTION  08/16/2022   Procedure: SUBMUCOSAL TATTOO INJECTION;  Surgeon: Eartha Angelia Sieving, MD;  [facility];  Service: Gastroenterology;;   TUBAL LIGATION      FAMILY HISTORY: Family History  Problem Relation Age of Onset   Pneumonia Mother    Heart attack Maternal Grandmother    Stroke Maternal Grandmother    Suicidality Brother    Aneurysm Maternal Aunt    Breast cancer Sister        04/2015  Stage I    SOCIAL HISTORY: Social History   Socioeconomic History   Marital status: Widowed    Spouse name: Not on file   Number of children: 0   Years of education: college   Highest education level: Not on file  Occupational History    Employer: RETIRED    Comment: retired  Tobacco Use   Smoking status: Never    Passive exposure: Never   Smokeless tobacco: Never  Vaping Use   Vaping status: Never Used  Substance and Sexual Activity   Alcohol  use: Yes    Alcohol /week: 1.0 standard drink of alcohol     Types: 1 Glasses of wine per week   Drug use: No   Sexual activity: Not Currently  Other Topics Concern   Not on file  Social History  Narrative   Patient lives at home alone and  she is widowed. Retired.   College education    Caffeine one cup daily.   Right handed         Social Drivers of Health   Financial Resource Strain: Not on file  Food Insecurity: No Food Insecurity (05/03/2023)   Hunger Vital Sign    Worried About Running Out of Food in the Last Year: Never true    Ran Out of Food in the Last Year: Never true  Transportation Needs: No Transportation Needs (05/03/2023)   PRAPARE - Administrator, Civil Service (Medical): No    Lack of Transportation (Non-Medical): No  Physical Activity: Not on file  Stress: Not on file  Social Connections: Unknown (07/19/2021)   [facility]   Social Network    Social Network: Not on file  Intimate Partner Violence: Not At Risk (05/03/2023)   Humiliation, Afraid, Rape, and Kick questionnaire    Fear of Current or Ex-Partner: No    Emotionally Abused: No    Physically Abused: No    Sexually Abused: No   PHYSICAL EXAM  Vitals:   01/24/24 1442  BP: 134/66    There is no height or weight on file to calculate BMI.    01/24/2024    2:40 PM 10/31/2023    3:00 PM 11/01/2022    2:25 PM  MMSE - Mini Mental State Exam  Orientation to time 3 3 4   Orientation to Place 5 5 5   Registration 3 3 3   Attention/ Calculation 2 1 3   Recall 1 3 2   Language- name 2 objects 2 2 2   Language- repeat 1 1 1   Language- follow 3 step command 3 3 3   Language- read & follow direction 1 1 1   Write a sentence 1 1 1   Copy design 0 1 0  Total score 22 24 25    Generalized: Well developed, in no acute distress, more tired  Neurological examination  Mentation: Alert oriented to time, place, history taking. Follows all commands speech and language fluent Cranial nerve II-XII: Pupils were equal round reactive to light. Extraocular movements were full, visual field were full on confrontational test. Facial sensation and strength were normal.  Head turning and shoulder  shrug  were normal and symmetric. Motor: The motor testing reveals 5 over 5 strength of all 4 extremities. Good symmetric motor tone is noted throughout.  Sensory: Sensory testing is intact to soft touch on all 4 extremities. No evidence of extinction is noted.  Coordination: Cerebellar testing reveals good finger-nose-finger and heel-to-shin bilaterally.  Gait and station: Gait is normal.  Slightly wide-based. Reflexes: Deep tendon reflexes are symmetric increased at the knees  DIAGNOSTIC DATA (LABS, IMAGING, TESTING) - I reviewed patient records, labs, notes, testing and imaging myself where available.  Lab Results  Component Value Date   WBC 13.8 (H) 01/04/2024   HGB 10.8 (L) 01/04/2024   HCT 33.2 (L) 01/04/2024   MCV 92.5 01/04/2024   PLT 278 01/04/2024      Component Value Date/Time   NA 135 01/04/2024 1607   NA 136 10/31/2023 1518   K 4.5 01/04/2024 1607   CL 99 01/04/2024 1607   CO2 26 01/04/2024 1607   GLUCOSE 93 01/04/2024 1607   BUN 19 01/04/2024 1607   BUN 21 10/31/2023 1518   CREATININE 1.24 (H) 01/04/2024 1607   CALCIUM 9.1 01/04/2024 1607   PROT 6.6 10/31/2023 1518   ALBUMIN 4.0 01/04/2024 1607   ALBUMIN 4.1 10/31/2023 1518  AST 21 10/31/2023 1518   ALT 14 10/31/2023 1518   ALKPHOS 154 (H) 10/31/2023 1518   BILITOT 0.3 10/31/2023 1518   GFRNONAA 45 (L) 01/04/2024 1607   GFRAA 61 12/04/2019 0849   No results found for: CHOL, HDL, LDLCALC, LDLDIRECT, TRIG, CHOLHDL No results found for: YHAJ8R Lab Results  Component Value Date   VITAMINB12 1,038 (H) 12/08/2023   Lab Results  Component Value Date   TSH 2.217 05/03/2023    Lauraine Born, AGNP-C, DNP 01/24/2024, 3:16 PM [facility] [address] [place], [place] [address] [phone]

## 2024-01-24 ENCOUNTER — Ambulatory Visit (INDEPENDENT_AMBULATORY_CARE_PROVIDER_SITE_OTHER): Admitting: Neurology

## 2024-01-24 ENCOUNTER — Encounter: Payer: Self-pay | Admitting: Neurology

## 2024-01-24 VITALS — BP 134/66

## 2024-01-24 DIAGNOSIS — E538 Deficiency of other specified B group vitamins: Secondary | ICD-10-CM

## 2024-01-24 DIAGNOSIS — G309 Alzheimer's disease, unspecified: Secondary | ICD-10-CM | POA: Diagnosis not present

## 2024-01-24 DIAGNOSIS — G40909 Epilepsy, unspecified, not intractable, without status epilepticus: Secondary | ICD-10-CM | POA: Diagnosis not present

## 2024-01-24 DIAGNOSIS — F0280 Dementia in other diseases classified elsewhere without behavioral disturbance: Secondary | ICD-10-CM | POA: Diagnosis not present

## 2024-01-24 MED ORDER — LAMOTRIGINE ER 50 MG PO TB24: 50.0000 mg | ORAL_TABLET | Freq: Every morning | ORAL | 11 refills | Status: AC

## 2024-01-24 MED ORDER — CLOBAZAM 2.5 MG/ML PO SUSP: 2.5000 mg | Freq: Every day | ORAL | 0 refills | Status: AC

## 2024-01-24 MED ORDER — LAMOTRIGINE ER 250 MG PO TB24: 250.0000 mg | ORAL_TABLET | Freq: Every day | ORAL | 11 refills | Status: AC

## 2024-01-24 NOTE — Patient Instructions (Addendum)
 Need to restart seizure medications as prescribed:  Lamictal  XR 250 mg at bedtime  Lamictal  XR 50 mg in the morning  Onfi  2.5 mg at bedtime  It does not look like you are taking the Lamictal  XR 50 daily or the Onfi  2.5 mg daily. I sent scripts for both. Please add back in Lamictal  XR 50 mg AM 1st for 1 week, then add the Onfi  back in next week to minimize medication side effect getting back on her regimen. Hence, making 1 medication change at a time. If any questions, please call [phone].  Check labs today   Meds ordered this encounter  Medications   LamoTRIgine  (LAMICTAL  XR) 250 MG TB24 24 hour tablet    Sig: Take 1 tablet (250 mg total) by mouth at bedtime.    Dispense:  30 tablet    Refill:  11   lamoTRIgine  (LAMICTAL  XR) 50 MG 24 hour tablet    Sig: Take 1 tablet (50 mg total) by mouth in the morning.    Dispense:  30 tablet    Refill:  11   cloBAZam  (ONFI ) 2.5 MG/ML solution    Sig: Take 1 mL (2.5 mg total) by mouth at bedtime.    Dispense:  120 mL    Refill:  0

## 2024-01-27 LAB — LAMOTRIGINE LEVEL: Lamotrigine Lvl: 11.7 ug/mL (ref 2.0–20.0)

## 2024-01-27 LAB — VITAMIN D 25 HYDROXY (VIT D DEFICIENCY, FRACTURES): Vit D, 25-Hydroxy: 24 ng/mL — ABNORMAL LOW (ref 30.0–100.0)

## 2024-01-27 LAB — ATN PROFILE
A -- Beta-amyloid 42/40 Ratio: 0.102 — AB (ref >0.102)
Beta-amyloid 40: 276.62 pg/mL
Beta-amyloid 42: 28.17 pg/mL
N -- NfL, Plasma: 8.44 pg/mL — AB (ref 0.00–6.04)
T -- p-tau181: 2.47 pg/mL — AB (ref 0.00–0.97)

## 2024-01-27 LAB — TSH: TSH: 2.3 u[IU]/mL (ref 0.450–4.500)

## 2024-01-29 ENCOUNTER — Encounter: Payer: Self-pay | Admitting: Oncology

## 2024-01-29 ENCOUNTER — Ambulatory Visit: Payer: Self-pay | Admitting: Neurology

## 2024-01-29 NOTE — Telephone Encounter (Signed)
 Please call the patient's sister. Labs came back showing markers for Alzheimer's Disease were positive. Vitamin D  is low, recommend taking 1000 units daily OTC. Rest of labs are normal.   Her sister needed a letter for her  long term insurance. I have written it below and signed it in communications. Please make sure this is all info needed. Thanks  Sheri Valencia is an established patient at our neurology office.  She has a known memory impairment that has progressed over time and is consistent with Alzheimer's disease. Due to the progressive nature of this disease, she requires long term supervision and assistance with activities of daily living. She will need supervision to ensure safety and prevent injury. If you have any questions or concerns, please don't hesitate to call.

## 2024-01-31 NOTE — Telephone Encounter (Signed)
 Spoke with patient's sister and notified her of lab results as noted below by Lauraine NP. She is going to try to sign up on mychart as pt's proxy. Link sent to her phone. She will pick up a signed letter on Tuesday when Lauraine is back in office. She thanked me for the call.   Letter on [employer] for signature. We will call her when letter is ready for pickup.

## 2024-02-06 ENCOUNTER — Telehealth: Payer: Self-pay

## 2024-02-06 NOTE — Telephone Encounter (Signed)
 Pt's sister was called and the message from CMA was relayed, she will come tomorrow for it.

## 2024-02-06 NOTE — Telephone Encounter (Signed)
 Phone room let the sister harland know a letter was written and placed up front as requested

## 2024-02-07 ENCOUNTER — Ambulatory Visit: Admitting: Urology

## 2024-02-07 VITALS — BP 147/70 | HR 85

## 2024-02-07 DIAGNOSIS — R3129 Other microscopic hematuria: Secondary | ICD-10-CM | POA: Diagnosis not present

## 2024-02-07 DIAGNOSIS — N3281 Overactive bladder: Secondary | ICD-10-CM | POA: Diagnosis not present

## 2024-02-07 DIAGNOSIS — Z87440 Personal history of urinary (tract) infections: Secondary | ICD-10-CM

## 2024-02-07 LAB — URINALYSIS, ROUTINE W REFLEX MICROSCOPIC
Bilirubin, UA: NEGATIVE
Glucose, UA: NEGATIVE
Ketones, UA: NEGATIVE
Nitrite, UA: NEGATIVE
Protein,UA: NEGATIVE
Specific Gravity, UA: 1.01 (ref 1.005–1.030)
Urobilinogen, Ur: 0.2 mg/dL (ref 0.2–1.0)
pH, UA: 7 (ref 5.0–7.5)

## 2024-02-07 LAB — MICROSCOPIC EXAMINATION

## 2024-02-07 MED ORDER — MIRABEGRON ER 25 MG PO TB24: 25.0000 mg | ORAL_TABLET | Freq: Every day | ORAL | 11 refills | Status: AC

## 2024-02-07 NOTE — Progress Notes (Unsigned)
 02/07/2024 10:28 AM   Sheri Valencia 1945-12-08 [id number]  Referring provider: [name], Valencia [address] [place],  [place] [address]  Followup microhematuria   HPI: Sheri Valencia is a [phone] here for followup for microhematuria and frequent UTIs. No UTIs since last visit. She has nocturnal enuresis multiple times per week. She has daytime urgency and occasional urge incontinence.   PMH: Past Medical History:  Diagnosis Date   Depression    Memory loss    Seizure (HCC)    last seizure qwas over 1 year ago.    Surgical History: Past Surgical History:  Procedure Laterality Date   APPENDECTOMY     BIOPSY  08/16/2022   Procedure: BIOPSY;  Surgeon: Sheri Valencia;  [facility];  Service: Gastroenterology;;   COLONOSCOPY WITH PROPOFOL  N/A 08/16/2022   Procedure: COLONOSCOPY WITH PROPOFOL ;  Surgeon: Sheri Valencia;  [facility];  Service: Gastroenterology;  Laterality: N/A;  730am, asa 3   HEMOSTASIS CLIP PLACEMENT  08/16/2022   Procedure: HEMOSTASIS CLIP PLACEMENT;  Surgeon: Sheri Valencia;  [facility];  Service: Gastroenterology;;   POLYPECTOMY  08/16/2022   Procedure: POLYPECTOMY;  Surgeon: Sheri Valencia;  [facility];  Service: Gastroenterology;;   Sheri Valencia  08/16/2022   Procedure: Sheri Valencia;  Surgeon: Sheri Angelia, Sheri Valencia;  [facility];  Service: Gastroenterology;;   SUBMUCOSAL TATTOO INJECTION  08/16/2022   Procedure: SUBMUCOSAL TATTOO INJECTION;  Surgeon: Sheri Valencia;  [facility];  Service: Gastroenterology;;   TUBAL LIGATION      Home Medications:  Allergies as of 02/07/2024       Reactions   Lorazepam Other (See Comments)   confusion Other Reaction(s): Mental Status Changes Other Reaction(s): Confusion confusion    confusion  Other Reaction(s): Mental Status Changes        Medication List         Accurate as of February 07, 2024 10:28 AM. If you have any questions, ask your nurse or doctor.          alendronate 70 MG tablet Commonly known as: FOSAMAX Take 70 mg by mouth every Friday.   cloBAZam  2.5 MG/ML solution Commonly known as: ONFI  Take 1 mL (2.5 mg total) by mouth at bedtime.   LamoTRIgine  250 MG Tb24 24 hour tablet Commonly known as: LaMICtal  XR Take 1 tablet (250 mg total) by mouth at bedtime.   lamoTRIgine  50 MG 24 hour tablet Commonly known as: LAMICTAL  XR Take 1 tablet (50 mg total) by mouth in the morning.   losartan 50 MG tablet Commonly known as: COZAAR Take 50 mg by mouth daily.   memantine  10 MG tablet Commonly known as: NAMENDA  Take 1 tablet (10 mg total) by mouth 2 (two) times daily.   rosuvastatin 5 MG tablet Commonly known as: CRESTOR Take 10 mg by mouth daily.   sertraline 100 MG tablet Commonly known as: ZOLOFT Take 100 mg by mouth daily.   venlafaxine 25 MG tablet Commonly known as: EFFEXOR Take 25 mg by mouth 2 (two) times daily.        Allergies:  Allergies  Allergen Reactions   Lorazepam Other (See Comments)    confusion  Other Reaction(s): Mental Status Changes  Other Reaction(s): Confusion  confusion    confusion  Other Reaction(s): Mental Status Changes    Family History: Family History  Problem Relation Age of Onset   Pneumonia Mother  Heart attack Maternal Grandmother    Stroke Maternal Grandmother    Suicidality Brother    Aneurysm Maternal Aunt    Breast cancer Sister        04/2015  Stage I    Social History:  reports that she has never smoked. She has never been exposed to tobacco smoke. She has never used smokeless tobacco. She reports current alcohol  use of about 1.0 standard drink of alcohol  per week. She reports that she does not use drugs.  ROS: All other review of systems were reviewed and are negative except what is noted above in HPI  Physical Exam: BP (!) 147/70   Pulse 85    Constitutional:  Alert and oriented, No acute distress. HEENT: [place] AT, moist mucus membranes.  Trachea midline, no masses. Cardiovascular: No clubbing, cyanosis, or edema. Respiratory: Normal respiratory effort, no increased work of breathing. GI: Abdomen is soft, nontender, nondistended, no abdominal masses GU: No CVA tenderness.  Lymph: No cervical or inguinal lymphadenopathy. Skin: No rashes, bruises or suspicious lesions. Neurologic: Grossly intact, no focal deficits, moving all 4 extremities. Psychiatric: Normal mood and affect.  Laboratory Data: Lab Results  Component Value Date   WBC 13.8 (H) 01/04/2024   HGB 10.8 (L) 01/04/2024   HCT 33.2 (L) 01/04/2024   MCV 92.5 01/04/2024   PLT 278 01/04/2024    Lab Results  Component Value Date   CREATININE 1.24 (H) 01/04/2024    No results found for: PSA  No results found for: TESTOSTERONE  No results found for: HGBA1C  Urinalysis    Component Value Date/Time   COLORURINE STRAW (A) 03/03/2019 0900   APPEARANCEUR Clear 08/14/2023 1433   LABSPEC 1.005 03/03/2019 0900   PHURINE 6.0 03/03/2019 0900   GLUCOSEU Negative 08/14/2023 1433   HGBUR SMALL (A) 03/03/2019 0900   BILIRUBINUR Negative 08/14/2023 1433   KETONESUR NEGATIVE 03/03/2019 0900   PROTEINUR Negative 08/14/2023 1433   PROTEINUR NEGATIVE 03/03/2019 0900   NITRITE Negative 08/14/2023 1433   NITRITE NEGATIVE 03/03/2019 0900   LEUKOCYTESUR 2+ (A) 08/14/2023 1433   LEUKOCYTESUR SMALL (A) 03/03/2019 0900    Lab Results  Component Value Date   LABMICR See below: 08/14/2023   WBCUA >30 (A) 08/14/2023   LABEPIT >10 (A) 08/14/2023   BACTERIA Few (A) 08/14/2023    Pertinent Imaging: *** No results found for this or any previous visit.  No results found for this or any previous visit.  No results found for this or any previous visit.  No results found for this or any previous visit.  No results found for this or any previous visit.  No results  found for this or any previous visit.  Results for orders placed during the hospital encounter of 08/01/23  CT HEMATURIA WORKUP  Narrative CLINICAL DATA:  Microscopic hematuria * Tracking Code: BO *  EXAM: CT ABDOMEN AND PELVIS WITHOUT AND WITH CONTRAST  TECHNIQUE: Multidetector CT imaging of the abdomen and pelvis was performed following the standard protocol before and following the bolus administration of intravenous contrast.  RADIATION DOSE REDUCTION: This exam was performed according to the departmental dose-optimization program which includes automated exposure control, adjustment of the mA and/or kV according to patient size and/or use of iterative reconstruction technique.  CONTRAST:  80mL OMNIPAQUE  IOHEXOL  350 MG/ML SOLN  COMPARISON:  None Available.  FINDINGS: Lower chest: No acute abnormality.  Hepatobiliary: No solid liver abnormality is seen. No gallstones, gallbladder wall thickening, or biliary dilatation.  Pancreas: Unremarkable. No pancreatic  ductal dilatation or surrounding inflammatory changes.  Spleen: Normal in size without significant abnormality.  Adrenals/Urinary Tract: Adrenal glands are unremarkable. Severely atrophic left kidney. No calculi or hydronephrosis. Mild compensatory hypertrophy of the right kidney which is otherwise normal, without renal calculi, solid lesion, or hydronephrosis. Evaluation of the bladder somewhat limited by dense metallic streak artifact from adjacent hip arthroplasty. Within this limitation, no mass or other abnormality. No right-sided urinary tract filling defect on delayed phase imaging. No significant left-sided urinary excretion.  Stomach/Bowel: Stomach is within normal limits. Appendix not clearly visualized. No evidence of bowel wall thickening, distention, or inflammatory changes.  Vascular/Lymphatic: Aortic atherosclerosis. No enlarged abdominal or pelvic lymph nodes.  Reproductive: No mass or other  abnormality.  Other: No abdominal wall hernia or abnormality. No ascites.  Musculoskeletal: No acute or significant osseous findings. Status post left hip total arthroplasty.  IMPRESSION: 1. Severely atrophic left kidney. No calculi or hydronephrosis. 2. Mild compensatory hypertrophy of the right kidney which is otherwise normal, without renal calculi, solid lesion, or hydronephrosis. 3. Evaluation of the bladder somewhat limited by dense metallic streak artifact from adjacent hip arthroplasty. Within this limitation, no mass or other abnormality. No right-sided urinary tract filling defect on delayed phase imaging.  Aortic Atherosclerosis (ICD10-I70.0).   Electronically Signed By: Marolyn JONETTA Jaksch M.D. On: 08/06/2023 07:13  No results found for this or any previous visit.   Assessment & Plan:    1. Microscopic hematuria (Primary) *** - Urinalysis, Routine w reflex microscopic  2. History of recurrent UTI (urinary tract infection) ***  3. OAb We will trial mirabegron  25mg     No follow-ups on file.  Belvie Clara, Valencia  [facility]

## 2024-02-09 ENCOUNTER — Ambulatory Visit: Payer: Self-pay

## 2024-02-09 LAB — URINE CULTURE

## 2024-02-13 ENCOUNTER — Ambulatory Visit: Admitting: Urology

## 2024-02-13 ENCOUNTER — Encounter: Payer: Self-pay | Admitting: Urology

## 2024-02-13 NOTE — Procedures (Signed)
   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  It was performed during wakefulness, drowsiness and asleep.  Hyperventilation and photic stimulation were performed as activating procedures.  There are frequent noted frontal muscle artifact.  Upon maximum arousal, posterior dominant waking rhythm consistent of rhythmic alpha range activity. Activities are symmetric over the bilateral posterior derivations and attenuated with eye opening.  Photic stimulation did not alter the tracing.  Hyperventilation produced mild/moderate buildup with higher amplitude and the slower activities noted.  During EEG recording, patient developed drowsiness and no stage of sleep was achieved. During EEG recording, there was no epileptiform discharge noted.  EKG demonstrate normal sinus rhythm.  CONCLUSION: This is a  normal awake EEG.  There is no electrodiagnostic evidence of epileptiform discharge.  Kellina Dreese, M.D. Ph.D.  [facility] [address] [place], [place] [address] Phone: [phone] Fax:      [phone]

## 2024-02-13 NOTE — Patient Instructions (Signed)

## 2024-02-14 ENCOUNTER — Ambulatory Visit: Payer: Self-pay | Admitting: Neurology

## 2024-02-14 ENCOUNTER — Encounter: Payer: Self-pay | Admitting: Oncology

## 2024-02-20 NOTE — Telephone Encounter (Signed)
 Letter sent

## 2024-03-22 ENCOUNTER — Inpatient Hospital Stay: Attending: Hematology

## 2024-03-22 DIAGNOSIS — N1831 Chronic kidney disease, stage 3a: Secondary | ICD-10-CM | POA: Insufficient documentation

## 2024-03-22 DIAGNOSIS — F0280 Dementia in other diseases classified elsewhere without behavioral disturbance: Secondary | ICD-10-CM | POA: Insufficient documentation

## 2024-03-22 DIAGNOSIS — E538 Deficiency of other specified B group vitamins: Secondary | ICD-10-CM | POA: Diagnosis not present

## 2024-03-22 DIAGNOSIS — G309 Alzheimer's disease, unspecified: Secondary | ICD-10-CM | POA: Diagnosis not present

## 2024-03-22 DIAGNOSIS — D508 Other iron deficiency anemias: Secondary | ICD-10-CM

## 2024-03-22 DIAGNOSIS — D509 Iron deficiency anemia, unspecified: Secondary | ICD-10-CM | POA: Diagnosis present

## 2024-03-22 DIAGNOSIS — Z803 Family history of malignant neoplasm of breast: Secondary | ICD-10-CM | POA: Insufficient documentation

## 2024-03-22 DIAGNOSIS — Z79899 Other long term (current) drug therapy: Secondary | ICD-10-CM | POA: Insufficient documentation

## 2024-03-22 DIAGNOSIS — F5089 Other specified eating disorder: Secondary | ICD-10-CM | POA: Insufficient documentation

## 2024-03-22 LAB — CBC
HCT: 36.4 % (ref 36.0–46.0)
Hemoglobin: 11.7 g/dL — ABNORMAL LOW (ref 12.0–15.0)
MCH: 29.1 pg (ref 26.0–34.0)
MCHC: 32.1 g/dL (ref 30.0–36.0)
MCV: 90.5 fL (ref 80.0–100.0)
Platelets: 264 K/uL (ref 150–400)
RBC: 4.02 MIL/uL (ref 3.87–5.11)
RDW: 13.1 % (ref 11.5–15.5)
WBC: 8.1 K/uL (ref 4.0–10.5)
nRBC: 0 % (ref 0.0–0.2)

## 2024-03-22 LAB — IRON AND TIBC
Iron: 59 ug/dL (ref 28–170)
Saturation Ratios: 25 % (ref 10.4–31.8)
TIBC: 239 ug/dL — ABNORMAL LOW (ref 250–450)
UIBC: 180 ug/dL

## 2024-03-22 LAB — VITAMIN B12: Vitamin B-12: 1033 pg/mL — ABNORMAL HIGH (ref 180–914)

## 2024-03-22 LAB — FERRITIN: Ferritin: 431 ng/mL — ABNORMAL HIGH (ref 11–307)

## 2024-03-24 LAB — METHYLMALONIC ACID, SERUM: Methylmalonic Acid, Quantitative: 307 nmol/L (ref 0–378)

## 2024-03-29 ENCOUNTER — Inpatient Hospital Stay: Admitting: Oncology

## 2024-03-29 DIAGNOSIS — D500 Iron deficiency anemia secondary to blood loss (chronic): Secondary | ICD-10-CM | POA: Diagnosis not present

## 2024-03-29 DIAGNOSIS — E538 Deficiency of other specified B group vitamins: Secondary | ICD-10-CM | POA: Diagnosis not present

## 2024-03-29 NOTE — Progress Notes (Signed)
 "  [facility] OFFICE PROGRESS NOTE  [name], MD  ASSESSMENT & PLAN:  I connected with Sheri Valencia on 03/29/24 at 10:15 AM EST by telephone visit and verified that I am speaking with the correct person using two identifiers.   I discussed the limitations, risks, security and privacy concerns of performing an evaluation and management service by telemedicine and the availability of in-person appointments. I also discussed with the patient that there may be a patient responsible charge related to this service. The patient expressed understanding and agreed to proceed.   Other persons participating in the visit and their role in the encounter: NP, Patient, Jodie RN   Patients location: Home  Providers location: Clinic    Assessment & Plan Iron deficiency anemia due to chronic blood loss - Anemia from CKD and of iron deficiency. - Reviewed labs from 03/22/2024: Hemoglobin 11.7 (10.8).  Ferritin is 431 and percent saturation 25%  (29 percent). - She received 1 dose of Monoferric  on 05/26/2023 and B12 injection. -She tolerated infusion well. --She is currently at [facility] assisted living and is not taking iron supplements per nurse. --We discussed in detail that we will hold off on iron supplements at this time as numbers appear to be stable. -She denies any melena, hematochezia or bright red blood per rectum. - RTC 3 months for follow-up with repeat CBC, ferritin and iron panel.   Vitamin B12 deficiency disease - B12 level is elevated at 1033. - She received 1 B12 shot on 05/26/2023.   - She is currently not taking B12 supplements. -Will continue to monitor.  Orders Placed This Encounter  Procedures   Vitamin B12    Standing Status:   Future    Expected Date:   06/27/2024    Expiration Date:   03/29/2025   Methylmalonic acid, serum    Standing Status:   Future    Expected Date:   07/05/2024    Expiration Date:   03/29/2025   Iron and TIBC ([facility] ONLY)    Standing Status:   Future    Expected Date:   07/05/2024    Expiration Date:   03/29/2025   Ferritin    Standing Status:   Future    Expected Date:   07/05/2024    Expiration Date:   03/29/2025   CBC    Standing Status:   Future    Expected Date:   07/05/2024    Expiration Date:   03/29/2025    INTERVAL HISTORY: Patient returns for follow-up for IDA and B12 deficiency.  Patient is currently residing at [facility] assisted living.  I spoke to [facility] her nurse as well as the patient during our visit.  Overall, patient is doing well.  She has developed a cough over the past week and thinks it is just a cold but there has been quite a bit of viral illnesses going through the facility.  No fevers.  She is not taking any B12 or iron supplements at this time.  Sheri Valencia tells me that patient is very active at assisted living facility and attends lots of activities including exercise classes.  Patient received 1 dose of Monoferric  on 05/26/2023 with great tolerance.  Has not noticed any bleeding or brbpr.   Patient is followed by urology for microhematuria and was last evaluated on 02/07/2024 by Dr. Sherrilee.  Patient is also followed by neurology Sheri Born, NP and was last evaluated on 01/29/2024.  She was found to have  positive markers for Alzheimer's disease after her sister noticed significant cognitive changes over the last 6 months.  She is currently on Namenda  10 mg twice daily.  Patient sister ultimately placed patient at assisted living facility [facility] due to progressive cognitive impairment and falls.  She was seen several times in October due to falls typically in the middle of the night with some significant injury.  She has been doing well.  We reviewed cbc, cmp, ferritin, iron panel, mma and b12 level.   SUMMARY OF HEMATOLOGIC HISTORY: Oncology History Overview Note  1.  Normocytic anemia: - Patient seen at the request of Dr. Rachele. - Last labs in  November 2024 showed hemoglobin 9.6. - She is taking iron tablet daily for the last few weeks.  Denies any BRBPR/melena.  Ice pica positive.  No prior transfusion history.   2.  CKD stage IIIa: - CKD since 2019, secondary to single functioning kidney.  CKD most likely secondary to her history of multiple UTIs/obstructive issues, hypertension, followed closely by Dr. Rachele.  She has nonnephrotic range proteinuria.   3.  Social/family history: - Lives by herself at home.  She is accompanied by her sister today.  She is independent of ADLs and IADLs.  Non-smoker.  No chemical exposure.  No family history of anemia.  Sister had breast cancer and niece had pancreatic cancer.  Paternal uncle had breast cancer.    No problem history exists.     CBC    Component Value Date/Time   WBC 8.1 03/22/2024 1027   RBC 4.02 03/22/2024 1027   HGB 11.7 (L) 03/22/2024 1027   HGB 11.0 (L) 10/31/2023 1518   HCT 36.4 03/22/2024 1027   HCT 32.9 (L) 10/31/2023 1518   PLT 264 03/22/2024 1027   PLT 277 10/31/2023 1518   MCV 90.5 03/22/2024 1027   MCV 90 10/31/2023 1518   MCH 29.1 03/22/2024 1027   MCHC 32.1 03/22/2024 1027   RDW 13.1 03/22/2024 1027   RDW 13.0 10/31/2023 1518   LYMPHSABS 1.5 01/04/2024 1607   LYMPHSABS 1.8 10/31/2023 1518   MONOABS 0.9 01/04/2024 1607   EOSABS 0.2 01/04/2024 1607   EOSABS 0.2 10/31/2023 1518   BASOSABS 0.1 01/04/2024 1607   BASOSABS 0.1 10/31/2023 1518       Latest Ref Rng & Units 01/04/2024    4:07 PM 10/31/2023    3:18 PM 08/01/2023   11:01 AM  CMP  Glucose 70 - 99 mg/dL 93  96    BUN 8 - 23 mg/dL 19  21    Creatinine 9.55 - 1.00 mg/dL 8.75  8.82  8.79   Sodium 135 - 145 mmol/L 135  136    Potassium 3.5 - 5.1 mmol/L 4.5  4.1    Chloride 98 - 111 mmol/L 99  98    CO2 22 - 32 mmol/L 26  21    Calcium 8.9 - 10.3 mg/dL 9.1  9.2    Total Protein 6.0 - 8.5 g/dL  6.6    Total Bilirubin 0.0 - 1.2 mg/dL  0.3    Alkaline Phos 44 - 121 IU/L  154    AST 0 - 40 IU/L   21    ALT 0 - 32 IU/L  14       Lab Results  Component Value Date   FERRITIN 431 (H) 03/22/2024   VITAMINB12 1,033 (H) 03/22/2024    There were no vitals filed for this visit.   Review of System:  Review  of Systems  Constitutional:  Positive for malaise/fatigue.  Respiratory:  Positive for cough.   Gastrointestinal:  Positive for abdominal pain.  Neurological:  Positive for dizziness.  Psychiatric/Behavioral:  Positive for depression and memory loss. The patient is nervous/anxious and has insomnia.     Physical Exam: Physical Exam Neurological:     Mental Status: She is alert and oriented to person, place, and time.     I provided 20 minutes of non face-to-face telephone visit time during this encounter, and > 50% was spent counseling as documented under my assessment & plan.   Delon Hope, NP 03/29/2024 11:03 AM "

## 2024-03-29 NOTE — Assessment & Plan Note (Addendum)
-   B12 level is elevated at 1033. - She received 1 B12 shot on 05/26/2023.   - She is currently not taking B12 supplements. -Will continue to monitor.

## 2024-03-29 NOTE — Assessment & Plan Note (Addendum)
-   Anemia from CKD and of iron deficiency. - Reviewed labs from 03/22/2024: Hemoglobin 11.7 (10.8).  Ferritin is 431 and percent saturation 25%  (29 percent). - She received 1 dose of Monoferric  on 05/26/2023 and B12 injection. -She tolerated infusion well. --She is currently at [facility] assisted living and is not taking iron supplements per nurse. --We discussed in detail that we will hold off on iron supplements at this time as numbers appear to be stable. -She denies any melena, hematochezia or bright red blood per rectum. - RTC 3 months for follow-up with repeat CBC, ferritin and iron panel.

## 2024-05-06 ENCOUNTER — Ambulatory Visit: Admitting: Neurology

## 2024-07-02 ENCOUNTER — Inpatient Hospital Stay

## 2024-07-09 ENCOUNTER — Inpatient Hospital Stay: Admitting: Oncology

## 2024-08-21 ENCOUNTER — Ambulatory Visit: Admitting: Urology

## 2024-10-31 ENCOUNTER — Ambulatory Visit: Admitting: Neurology
